# Patient Record
Sex: Female | Born: 1935 | Race: White | Hispanic: No | Marital: Married | Smoking: Never smoker
Health system: Southern US, Community
[De-identification: ages and names within clinical notes are randomized; demographics above are authoritative.]

## PROBLEM LIST (undated history)

## (undated) DIAGNOSIS — E119 Type 2 diabetes mellitus without complications: Secondary | ICD-10-CM

## (undated) DIAGNOSIS — E039 Hypothyroidism, unspecified: Secondary | ICD-10-CM

## (undated) DIAGNOSIS — E785 Hyperlipidemia, unspecified: Secondary | ICD-10-CM

## (undated) DIAGNOSIS — I447 Left bundle-branch block, unspecified: Secondary | ICD-10-CM

## (undated) DIAGNOSIS — G459 Transient cerebral ischemic attack, unspecified: Secondary | ICD-10-CM

## (undated) DIAGNOSIS — M23329 Other meniscus derangements, posterior horn of medial meniscus, unspecified knee: Secondary | ICD-10-CM

## (undated) DIAGNOSIS — I639 Cerebral infarction, unspecified: Secondary | ICD-10-CM

## (undated) DIAGNOSIS — C719 Malignant neoplasm of brain, unspecified: Secondary | ICD-10-CM

## (undated) DIAGNOSIS — C801 Malignant (primary) neoplasm, unspecified: Secondary | ICD-10-CM

## (undated) DIAGNOSIS — I059 Rheumatic mitral valve disease, unspecified: Secondary | ICD-10-CM

## (undated) DIAGNOSIS — R001 Bradycardia, unspecified: Secondary | ICD-10-CM

## (undated) DIAGNOSIS — I10 Essential (primary) hypertension: Secondary | ICD-10-CM

## (undated) DIAGNOSIS — I5032 Chronic diastolic (congestive) heart failure: Secondary | ICD-10-CM

## (undated) DIAGNOSIS — I480 Paroxysmal atrial fibrillation: Secondary | ICD-10-CM

## (undated) HISTORY — DX: Cerebral infarction, unspecified: I63.9

## (undated) HISTORY — DX: Hypothyroidism, unspecified: E03.9

## (undated) HISTORY — PX: SPLENECTOMY: SUR1306

## (undated) HISTORY — DX: Other meniscus derangements, posterior horn of medial meniscus, unspecified knee: M23.329

## (undated) HISTORY — DX: Hyperlipidemia, unspecified: E78.5

## (undated) HISTORY — DX: Essential (primary) hypertension: I10

## (undated) HISTORY — DX: Transient cerebral ischemic attack, unspecified: G45.9

## (undated) HISTORY — DX: Type 2 diabetes mellitus without complications: E11.9

## (undated) HISTORY — DX: Malignant neoplasm of brain, unspecified: C71.9

## (undated) HISTORY — DX: Rheumatic mitral valve disease, unspecified: I05.9

## (undated) HISTORY — DX: Bradycardia, unspecified: R00.1

---

## 1996-12-31 HISTORY — PX: BREAST LUMPECTOMY: SHX2

## 1997-09-02 HISTORY — PX: OTHER SURGICAL HISTORY: SHX169

## 1998-01-04 ENCOUNTER — Other Ambulatory Visit: Admission: RE | Admit: 1998-01-04 | Discharge: 1998-01-04 | Payer: Self-pay | Admitting: Obstetrics and Gynecology

## 1998-09-02 HISTORY — PX: CATARACT EXTRACTION: SUR2

## 1999-02-12 ENCOUNTER — Other Ambulatory Visit: Admission: RE | Admit: 1999-02-12 | Discharge: 1999-02-12 | Payer: Self-pay | Admitting: Obstetrics and Gynecology

## 2000-02-19 ENCOUNTER — Other Ambulatory Visit: Admission: RE | Admit: 2000-02-19 | Discharge: 2000-02-19 | Payer: Self-pay | Admitting: Obstetrics and Gynecology

## 2001-01-07 ENCOUNTER — Emergency Department (HOSPITAL_COMMUNITY): Admission: EM | Admit: 2001-01-07 | Discharge: 2001-01-07 | Payer: Self-pay | Admitting: *Deleted

## 2001-01-14 ENCOUNTER — Ambulatory Visit (HOSPITAL_COMMUNITY): Admission: RE | Admit: 2001-01-14 | Discharge: 2001-01-14 | Payer: Self-pay | Admitting: Oncology

## 2001-01-14 ENCOUNTER — Encounter (HOSPITAL_COMMUNITY): Payer: Self-pay | Admitting: Oncology

## 2001-02-19 ENCOUNTER — Other Ambulatory Visit: Admission: RE | Admit: 2001-02-19 | Discharge: 2001-02-19 | Payer: Self-pay | Admitting: Obstetrics and Gynecology

## 2001-03-17 ENCOUNTER — Encounter: Admission: RE | Admit: 2001-03-17 | Discharge: 2001-03-17 | Payer: Self-pay | Admitting: Oncology

## 2001-03-17 ENCOUNTER — Encounter (HOSPITAL_COMMUNITY): Admission: RE | Admit: 2001-03-17 | Discharge: 2001-04-16 | Payer: Self-pay | Admitting: Oncology

## 2001-05-25 ENCOUNTER — Ambulatory Visit (HOSPITAL_COMMUNITY): Admission: RE | Admit: 2001-05-25 | Discharge: 2001-05-25 | Payer: Self-pay | Admitting: General Surgery

## 2001-09-15 ENCOUNTER — Encounter (HOSPITAL_COMMUNITY): Admission: RE | Admit: 2001-09-15 | Discharge: 2001-10-15 | Payer: Self-pay | Admitting: Oncology

## 2001-09-15 ENCOUNTER — Encounter: Admission: RE | Admit: 2001-09-15 | Discharge: 2001-09-15 | Payer: Self-pay | Admitting: Oncology

## 2002-01-15 ENCOUNTER — Encounter (HOSPITAL_COMMUNITY): Payer: Self-pay | Admitting: Oncology

## 2002-01-15 ENCOUNTER — Ambulatory Visit (HOSPITAL_COMMUNITY): Admission: RE | Admit: 2002-01-15 | Discharge: 2002-01-15 | Payer: Self-pay | Admitting: Oncology

## 2002-02-15 ENCOUNTER — Encounter (HOSPITAL_COMMUNITY): Admission: RE | Admit: 2002-02-15 | Discharge: 2002-03-17 | Payer: Self-pay | Admitting: Oncology

## 2002-02-15 ENCOUNTER — Encounter: Admission: RE | Admit: 2002-02-15 | Discharge: 2002-02-15 | Payer: Self-pay | Admitting: Oncology

## 2002-05-10 ENCOUNTER — Encounter: Admission: RE | Admit: 2002-05-10 | Discharge: 2002-05-10 | Payer: Self-pay | Admitting: Oncology

## 2002-05-10 ENCOUNTER — Encounter (HOSPITAL_COMMUNITY): Admission: RE | Admit: 2002-05-10 | Discharge: 2002-06-09 | Payer: Self-pay | Admitting: Oncology

## 2003-01-24 ENCOUNTER — Ambulatory Visit (HOSPITAL_COMMUNITY): Admission: RE | Admit: 2003-01-24 | Discharge: 2003-01-24 | Payer: Self-pay | Admitting: Oncology

## 2003-01-24 ENCOUNTER — Encounter (HOSPITAL_COMMUNITY): Payer: Self-pay | Admitting: Oncology

## 2003-02-16 ENCOUNTER — Encounter (HOSPITAL_COMMUNITY): Admission: RE | Admit: 2003-02-16 | Discharge: 2003-03-18 | Payer: Self-pay | Admitting: Oncology

## 2003-02-16 ENCOUNTER — Encounter: Admission: RE | Admit: 2003-02-16 | Discharge: 2003-02-16 | Payer: Self-pay | Admitting: Oncology

## 2005-04-19 ENCOUNTER — Ambulatory Visit: Payer: Self-pay | Admitting: Family Medicine

## 2005-04-21 ENCOUNTER — Emergency Department: Payer: Self-pay | Admitting: Emergency Medicine

## 2006-03-06 ENCOUNTER — Other Ambulatory Visit: Payer: Self-pay

## 2006-03-06 ENCOUNTER — Inpatient Hospital Stay: Payer: Self-pay | Admitting: Cardiology

## 2006-03-06 ENCOUNTER — Ambulatory Visit: Payer: Self-pay | Admitting: Family Medicine

## 2006-03-16 ENCOUNTER — Emergency Department: Payer: Self-pay | Admitting: Internal Medicine

## 2006-03-16 ENCOUNTER — Other Ambulatory Visit: Payer: Self-pay

## 2006-04-30 ENCOUNTER — Ambulatory Visit: Payer: Self-pay | Admitting: Family Medicine

## 2007-05-15 ENCOUNTER — Ambulatory Visit: Payer: Self-pay | Admitting: Family Medicine

## 2008-02-27 ENCOUNTER — Inpatient Hospital Stay: Payer: Self-pay | Admitting: Internal Medicine

## 2008-02-27 ENCOUNTER — Other Ambulatory Visit: Payer: Self-pay

## 2008-05-18 ENCOUNTER — Ambulatory Visit: Payer: Self-pay | Admitting: Family Medicine

## 2009-02-07 ENCOUNTER — Ambulatory Visit: Payer: Self-pay | Admitting: Family Medicine

## 2009-02-22 ENCOUNTER — Ambulatory Visit: Payer: Self-pay | Admitting: Internal Medicine

## 2009-02-22 ENCOUNTER — Emergency Department: Payer: Self-pay | Admitting: Emergency Medicine

## 2009-08-08 ENCOUNTER — Ambulatory Visit: Payer: Self-pay | Admitting: Family Medicine

## 2010-07-25 LAB — PROTIME-INR

## 2010-08-14 ENCOUNTER — Ambulatory Visit: Payer: Self-pay | Admitting: Family Medicine

## 2010-11-01 HISTORY — PX: CATARACT EXTRACTION: SUR2

## 2010-11-21 ENCOUNTER — Ambulatory Visit: Payer: Self-pay | Admitting: Ophthalmology

## 2011-02-04 ENCOUNTER — Telehealth: Payer: Self-pay | Admitting: Cardiovascular Disease

## 2011-02-04 NOTE — Telephone Encounter (Signed)
Spoke with pt to remind her of her new pt appt with Gollan on 02/05/11.

## 2011-02-05 ENCOUNTER — Ambulatory Visit (INDEPENDENT_AMBULATORY_CARE_PROVIDER_SITE_OTHER): Payer: PRIVATE HEALTH INSURANCE | Admitting: Cardiovascular Disease

## 2011-02-05 ENCOUNTER — Encounter: Payer: Self-pay | Admitting: Cardiovascular Disease

## 2011-02-05 DIAGNOSIS — I4891 Unspecified atrial fibrillation: Secondary | ICD-10-CM | POA: Insufficient documentation

## 2011-02-05 DIAGNOSIS — I059 Rheumatic mitral valve disease, unspecified: Secondary | ICD-10-CM

## 2011-02-05 DIAGNOSIS — I10 Essential (primary) hypertension: Secondary | ICD-10-CM | POA: Insufficient documentation

## 2011-02-05 DIAGNOSIS — E119 Type 2 diabetes mellitus without complications: Secondary | ICD-10-CM

## 2011-02-05 DIAGNOSIS — E785 Hyperlipidemia, unspecified: Secondary | ICD-10-CM | POA: Insufficient documentation

## 2011-02-05 DIAGNOSIS — I340 Nonrheumatic mitral (valve) insufficiency: Secondary | ICD-10-CM

## 2011-02-05 MED ORDER — DABIGATRAN ETEXILATE MESYLATE 150 MG PO CAPS: ORAL_CAPSULE | ORAL | Status: DC

## 2011-02-05 MED ORDER — ASPIRIN EC 81 MG PO TBEC: 81.0000 mg | DELAYED_RELEASE_TABLET | Freq: Every day | ORAL | Status: AC

## 2011-02-05 NOTE — Assessment & Plan Note (Signed)
Blood pressure is well controlled on today's visit. No changes made to the medications. 

## 2011-02-05 NOTE — Assessment & Plan Note (Signed)
We have encouraged continued exercise, careful diet management in an effort to lose weight. 

## 2011-02-05 NOTE — Patient Instructions (Addendum)
You are doing well. Stop the warfarin. Start ASA 81 mg take one tablet daily. If you convert to atrial fibrillation, start pradaxa 150mg  one tab in the AM and one in the PM until your rhythm goes back into normal sinus rhythm.  If you have more episodes of atrial fibrillation, call our office to start a new medication (possibly digoxin) Please call us if you have new issues that need to be addressed before your next appt.  We will call you for a follow up Appt. In 6 months

## 2011-02-05 NOTE — Progress Notes (Signed)
   Patient ID: Rhonda Hurley, female    DOB: 1927-02-09, 77 y.o.   MRN: [id number]  HPI Comments: Rhonda Hurley is a very pleasant 77 year old woman with a history of paroxysmal atrial fibrillation, diabetes, hypertension, hypothyroidism, echocardiogram read by outside cardiologist suggesting moderate mitral valve regurgitation and diastolic dysfunction who presents to establish care.  She has accurate details of when she converts to atrial fibrillation. Her last episode was in February, lasting 6 hours. Prior to that she had an episode in February, brief episode in October. She reports her episodes are rare and did not last more than 10 hours. She has had increasing frequency and intensity of gum bleeding which he attributes to the warfarin. Atrial Fibrillation started initially in 2007 and she was started on warfarin shortly after that. She denies any significant chest pain or shortness of breath currently, no significant edema and otherwise feels well.  Remote cardiac catheterization several years ago when she first developed atrial fibrillation with no significant disease by report  Echocardiogram dated July 2008 showing normal systolic function, moderate MR, mild TR, diastolic dysfunction.  EKG today shows normal sinus rhythm with rate 57 beats per minute with no significant ST or T wave changes     Review of Systems  Constitutional: Negative.   HENT: Negative.        Bleeding gums periodically  Eyes: Negative.   Respiratory: Negative.   Cardiovascular: Negative.   Gastrointestinal: Negative.   Musculoskeletal: Negative.   Skin: Negative.   Neurological: Negative.   Hematological: Negative.   Psychiatric/Behavioral: Negative.   All other systems reviewed and are negative.    BP 128/62  Pulse 57  Ht 5\' 4"  (1.626 m)  Wt 179 lb 12.8 oz (81.557 kg)  BMI 30.86 kg/m2   Physical Exam  Nursing note and vitals reviewed. Constitutional: She is oriented to person, place, and time.  She appears well-developed and well-nourished.  HENT:  Head: Normocephalic.  Nose: Nose normal.  Mouth/Throat: Oropharynx is clear and moist.  Eyes: Conjunctivae are normal. Pupils are equal, round, and reactive to light.  Neck: Normal range of motion. Neck supple. No JVD present.  Cardiovascular: Normal rate, regular rhythm, S1 normal, S2 normal, normal heart sounds and intact distal pulses.  Exam reveals no gallop and no friction rub.   No murmur heard. Pulmonary/Chest: Effort normal and breath sounds normal. No respiratory distress. She has no wheezes. She has no rales. She exhibits no tenderness.  Abdominal: Soft. Bowel sounds are normal. She exhibits no distension. There is no tenderness.  Musculoskeletal: Normal range of motion. She exhibits no edema and no tenderness.  Lymphadenopathy:    She has no cervical adenopathy.  Neurological: She is alert and oriented to person, place, and time. Coordination normal.  Skin: Skin is warm and dry. No rash noted. No erythema.  Psychiatric: She has a normal mood and affect. Her behavior is normal. Judgment and thought content normal.         Assessment and Plan

## 2011-02-05 NOTE — Assessment & Plan Note (Signed)
She has rare episodes and is able to do fine when she converts to sinus rhythm or to atrial fibrillation. As she is having gum bleeding, has rare episodes of arrhythmia, we have suggested she could hold toward further for now. We have given her samples of pradaxa to take if she has an episode of atrial fibrillation lasting more than 12 hours or if her episodes become more frequent. I've asked her to contact our office if she has more frequent episodes. We could start digoxin if she has more frequent episodes in an effort to maintain sinus rhythm. She does not want to add this medication at this time.

## 2011-02-05 NOTE — Assessment & Plan Note (Signed)
Previous echocardiogram several years ago did suggest moderate MR. This was not appreciated on clinical exam on auscultation and she has no shortness of breath, no edema, no lung findings. No further workup at this time.

## 2011-02-05 NOTE — Assessment & Plan Note (Signed)
Cholesterol has been excellent in the past though she has cut her simvastatin in half secondary to myalgias.

## 2011-02-25 ENCOUNTER — Telehealth: Payer: Self-pay | Admitting: *Deleted

## 2011-02-25 NOTE — Telephone Encounter (Signed)
Pt called today stating that she had an episode of A Fib on Friday 02/22/11 that started ~7:30pm and lasted 10 hours. Pt took one dose of Pradaxa (was changed from coumadin to Pradaxa prn). Pt has h/o PAF, seen in office 02/05/11, and Dr. Mariah Milling advised she call when she had an episode so we can track how frequent she is in afib. Pt states she is in normal rhythm now, has no c/o at this time, just wanted to document per Dr. Windell Hummingbird recommendation. Pt's last episode was prior to visit in Feb this year. Pt will call back for any other episodes, if become frequent, digoxin was suggested per note. She did state after taking Pradaxa that her FSBS was elevated at 202, but later in the night it dropped to 88. Pt was not sure if Pradaxa could cause this, she says her glucose is never that high. She did say she had a rich dessert, and I told her I feel this is the cause, but just wanted to make sure this could not be a side effect of Pradaxa, I notified pt that per med info I did not see as S/E but will ask Dr. Mariah Milling.

## 2011-03-12 ENCOUNTER — Encounter: Payer: Self-pay | Admitting: Cardiovascular Disease

## 2011-08-14 ENCOUNTER — Encounter: Payer: Self-pay | Admitting: Cardiovascular Disease

## 2011-08-14 ENCOUNTER — Telehealth: Payer: Self-pay | Admitting: *Deleted

## 2011-08-14 ENCOUNTER — Ambulatory Visit (INDEPENDENT_AMBULATORY_CARE_PROVIDER_SITE_OTHER): Payer: PRIVATE HEALTH INSURANCE | Admitting: Cardiovascular Disease

## 2011-08-14 VITALS — BP 124/64 | HR 82 | Ht 64.0 in | Wt 176.8 lb

## 2011-08-14 DIAGNOSIS — I10 Essential (primary) hypertension: Secondary | ICD-10-CM

## 2011-08-14 DIAGNOSIS — E785 Hyperlipidemia, unspecified: Secondary | ICD-10-CM

## 2011-08-14 DIAGNOSIS — E119 Type 2 diabetes mellitus without complications: Secondary | ICD-10-CM

## 2011-08-14 DIAGNOSIS — I4891 Unspecified atrial fibrillation: Secondary | ICD-10-CM

## 2011-08-14 DIAGNOSIS — R05 Cough: Secondary | ICD-10-CM

## 2011-08-14 NOTE — Assessment & Plan Note (Signed)
Blood pressure is well controlled on today's visit. No changes made to the medications. We have suggested she hold her HCTZ if her fluid intake is poor.

## 2011-08-14 NOTE — Progress Notes (Signed)
Patient ID: Rhonda Hurley, female    DOB: [birth year], 77 y.o.   MRN: [id number]  HPI Comments: Rhonda Hurley is a very pleasant 77 year old woman with a history of paroxysmal atrial fibrillation, diabetes, hypertension, hypothyroidism, echocardiogram read by outside cardiologist suggesting moderate mitral valve regurgitation and diastolic dysfunction who presents for Routine followup.  She has been doing well until the past 10 days but she has developed an upper respiratory infection. She has had significant cough, malaise and postnasal drip. She has been taking over-the-counter cold medications. She reports that starting Sunday, 4 days ago, she felt herself go into atrial fibrillation. She felt irregular pulsations. She has been short of breath and coughing from her cold. The irregular heartbeat has persisted through the week. She was given anticoagulation/pradaxa to take on an as needed basis though she reports only taking one pill on Monday night, none on Sunday or Tuesday. None today.  Remote cardiac catheterization several years ago when she first developed atrial fibrillation with no significant disease by report  Echocardiogram dated July 2008 showing normal systolic function, moderate MR, mild TR, diastolic dysfunction.  EKG today shows Atrial fibrillation with rate 82 beats per minute    Outpatient Encounter Prescriptions as of 08/14/2011  Medication Sig Dispense Refill  . amLODipine (NORVASC) 5 MG tablet Take 5 mg by mouth daily.        Marland Kitchen aspirin EC 81 MG tablet Take 1 tablet (81 mg total) by mouth daily.  150 tablet  2  . Cholecalciferol (VITAMIN D3) 1000 UNITS CAPS Take 1,000 Units by mouth 1 dose over 24 hours.        . dabigatran (PRADAXA) 150 MG CAPS Take one tablet twice a day as needed.  60 capsule  0  . hydrochlorothiazide 25 MG tablet Take 25 mg by mouth daily.        Marland Kitchen levothyroxine (SYNTHROID, LEVOTHROID) 100 MCG tablet Take 100 mcg by mouth daily.        . metFORMIN  (GLUMETZA) 500 MG (MOD) 24 hr tablet Take 500 mg by mouth 3 (three) times daily.        . metoprolol (LOPRESSOR) 50 MG tablet Take 1 1/2 tablet twice a day.      . ramipril (ALTACE) 10 MG tablet Take 10 mg by mouth daily.        . simvastatin (ZOCOR) 20 MG tablet Take 20 mg by mouth at bedtime.        . timolol (TIMOPTIC) 0.5 % ophthalmic solution 1 drop 2 (two) times daily.        . traMADol (ULTRAM) 50 MG tablet Take 50 mg by mouth every 6 (six) hours as needed.        Marland Kitchen DISCONTD: levothyroxine (SYNTHROID, LEVOTHROID) 125 MCG tablet Take 125 mcg by mouth daily.        Marland Kitchen DISCONTD: simvastatin (ZOCOR) 40 MG tablet 20 mg.          Review of Systems  Constitutional: Negative.   HENT: Negative.   Eyes: Negative.   Respiratory: Positive for cough and shortness of breath.   Cardiovascular: Negative.   Gastrointestinal: Negative.   Musculoskeletal: Negative.   Skin: Negative.   Neurological: Negative.   Hematological: Negative.   Psychiatric/Behavioral: Negative.   All other systems reviewed and are negative.    BP 124/64  Pulse 82  Ht 5\' 4"  (1.626 m)  Wt 176 lb 12 oz (80.173 kg)  BMI 30.34 kg/m2   Physical Exam  Nursing  note and vitals reviewed. Constitutional: She is oriented to person, place, and time. She appears well-developed and well-nourished.  HENT:  Head: Normocephalic.  Nose: Nose normal.  Mouth/Throat: Oropharynx is clear and moist.  Eyes: Conjunctivae are normal. Pupils are equal, round, and reactive to light.  Neck: Normal range of motion. Neck supple. No JVD present.  Cardiovascular: Normal rate, regular rhythm, S1 normal, S2 normal, normal heart sounds and intact distal pulses.  Exam reveals no gallop and no friction rub.   No murmur heard. Pulmonary/Chest: Effort normal and breath sounds normal. No respiratory distress. She has no wheezes. She has no rales. She exhibits no tenderness.  Abdominal: Soft. Bowel sounds are normal. She exhibits no distension. There  is no tenderness.  Musculoskeletal: Normal range of motion. She exhibits no edema and no tenderness.  Lymphadenopathy:    She has no cervical adenopathy.  Neurological: She is alert and oriented to person, place, and time. Coordination normal.  Skin: Skin is warm and dry. No rash noted. No erythema.  Psychiatric: She has a normal mood and affect. Her behavior is normal. Judgment and thought content normal.         Assessment and Plan

## 2011-08-14 NOTE — Assessment & Plan Note (Signed)
I suspect she has a viral URI. We have suggested she contact Dr. Terance Hart next week if she has no improvement. This is likely the reason for her atrial fibrillation.

## 2011-08-14 NOTE — Patient Instructions (Addendum)
You are doing well. Please start pradaxa twice a day  (morning and night) Hold the HCTZ until you are eating normally and drinking   Please call us if you have new issues that need to be addressed before your next appt.  The office will contact you for a follow up Appt. In 2 to 3 weeks

## 2011-08-14 NOTE — Telephone Encounter (Signed)
Husband calling states wife is back in afib. Pt already has appt this morning. He will bring her in earlier. Mylo Red RN

## 2011-08-14 NOTE — Assessment & Plan Note (Signed)
We have encouraged continued exercise, careful diet management in an effort to lose weight. 

## 2011-08-14 NOTE — Assessment & Plan Note (Signed)
No changes to the medications were made. 

## 2011-08-14 NOTE — Assessment & Plan Note (Signed)
She has converted back to atrial fibrillation starting this past weekend. We will start anticoagulation and have her followup in several weeks time. Heart rate is reasonably well controlled on beta blockers. If she does not convert on her own, we may need cardioversion.

## 2011-08-20 ENCOUNTER — Ambulatory Visit: Payer: Self-pay | Admitting: Family Medicine

## 2011-09-06 ENCOUNTER — Encounter: Payer: Self-pay | Admitting: Cardiovascular Disease

## 2011-09-06 ENCOUNTER — Ambulatory Visit (INDEPENDENT_AMBULATORY_CARE_PROVIDER_SITE_OTHER): Payer: Medicare Other | Admitting: Cardiovascular Disease

## 2011-09-06 DIAGNOSIS — I10 Essential (primary) hypertension: Secondary | ICD-10-CM

## 2011-09-06 DIAGNOSIS — I4891 Unspecified atrial fibrillation: Secondary | ICD-10-CM

## 2011-09-06 DIAGNOSIS — E785 Hyperlipidemia, unspecified: Secondary | ICD-10-CM

## 2011-09-06 NOTE — Assessment & Plan Note (Signed)
Blood pressure is well controlled on today's visit. No changes made to the medications. 

## 2011-09-06 NOTE — Assessment & Plan Note (Signed)
We have suggested she stay on simvastatin.

## 2011-09-06 NOTE — Progress Notes (Signed)
Patient ID: Rhonda Hurley, female    DOB: 1926/11/13, 78 y.o.   MRN: [id number]  HPI Comments: Rhonda Hurley is a very pleasant 78 year old woman with a history of paroxysmal atrial fibrillation, diabetes, hypertension, hypothyroidism, echocardiogram read by outside cardiologist suggesting moderate mitral valve regurgitation and diastolic dysfunction who presents for Routine followup.  She developed an episode of atrial fibrillation on her last clinic visit in the setting of upper respiratory infection.  Last episode of atrial fibrillation prior to this was 6 months ago. She reports that after she was last seen, she converted to normal sinus rhythm relatively quickly, within the day. She has not had any further episodes since that time. She is very symptomatic when her rhythm is now and reports that she has had no arrhythmia since that time.  Remote cardiac catheterization several years ago when she first developed atrial fibrillation with no significant disease by report  Echocardiogram dated July 2008 showing normal systolic function, moderate MR, mild TR, diastolic dysfunction.     Outpatient Encounter Prescriptions as of 09/06/2011  Medication Sig Dispense Refill  . amLODipine (NORVASC) 5 MG tablet Take 5 mg by mouth daily.        Marland Kitchen aspirin EC 81 MG tablet Take 1 tablet (81 mg total) by mouth daily.  150 tablet  2  . Cholecalciferol (VITAMIN D3) 1000 UNITS CAPS Take 1,000 Units by mouth 1 dose over 24 hours.        . dabigatran (PRADAXA) 150 MG CAPS Take one tablet twice a day as needed.  60 capsule  0  . hydrochlorothiazide 25 MG tablet Take 25 mg by mouth daily.        Marland Kitchen levothyroxine (SYNTHROID, LEVOTHROID) 100 MCG tablet Take 100 mcg by mouth daily.        . metFORMIN (GLUMETZA) 500 MG (MOD) 24 hr tablet Take 500 mg by mouth 3 (three) times daily.        . metoprolol (LOPRESSOR) 50 MG tablet Take 1 1/2 tablet twice a day.      . ramipril (ALTACE) 10 MG tablet Take 10 mg by mouth  daily.        . simvastatin (ZOCOR) 20 MG tablet Take 20 mg by mouth at bedtime.        . timolol (TIMOPTIC) 0.5 % ophthalmic solution 1 drop 2 (two) times daily.        . traMADol (ULTRAM) 50 MG tablet Take 50 mg by mouth every 6 (six) hours as needed.           Review of Systems  Constitutional: Negative.   HENT: Negative.   Eyes: Negative.   Cardiovascular: Negative.   Gastrointestinal: Negative.   Musculoskeletal: Negative.   Skin: Negative.   Neurological: Negative.   Hematological: Negative.   Psychiatric/Behavioral: Negative.   All other systems reviewed and are negative.    BP 128/80  Pulse 57  Ht 5\' 5"  (1.651 m)  Wt 179 lb 8 oz (81.421 kg)  BMI 29.87 kg/m2   Physical Exam  Nursing note and vitals reviewed. Constitutional: She is oriented to person, place, and time. She appears well-developed and well-nourished.  HENT:  Head: Normocephalic.  Nose: Nose normal.  Mouth/Throat: Oropharynx is clear and moist.  Eyes: Conjunctivae are normal. Pupils are equal, round, and reactive to light.  Neck: Normal range of motion. Neck supple. No JVD present.  Cardiovascular: Normal rate, regular rhythm, S1 normal, S2 normal and intact distal pulses.  Exam reveals no  gallop and no friction rub.   Murmur heard.  Crescendo systolic murmur is present with a grade of 2/6  Pulmonary/Chest: Effort normal and breath sounds normal. No respiratory distress. She has no wheezes. She has no rales. She exhibits no tenderness.  Abdominal: Soft. Bowel sounds are normal. She exhibits no distension. There is no tenderness.  Musculoskeletal: Normal range of motion. She exhibits no edema and no tenderness.  Lymphadenopathy:    She has no cervical adenopathy.  Neurological: She is alert and oriented to person, place, and time. Coordination normal.  Skin: Skin is warm and dry. No rash noted. No erythema.  Psychiatric: She has a normal mood and affect. Her behavior is normal. Judgment and thought  content normal.         Assessment and Plan

## 2011-09-06 NOTE — Assessment & Plan Note (Signed)
Rare episodes of atrial fibrillation. Recent episode last month in the setting of upper respiratory infection. We have suggested if she has additional episodes of atrial fibrillation that she call our office, take pradaxa b.i.d. Until her rhythm has converted back to normal. She can also take extra metoprolol.

## 2011-09-06 NOTE — Patient Instructions (Signed)
You are doing well. No medication changes were made.  FOR ATRIAL FIBRILLATION: Please take pradaxa twice a day in the Am and PM, only while in atrial fibrillation (blood thinner) Take extra metoprolol for the rhythm as needed  Please call us if you have new issues that need to be addressed before your next appt.  Your physician wants you to follow-up in: 6 months.  You will receive a reminder letter in the mail two months in advance. If you don't receive a letter, please call our office to schedule the follow-up appointment.

## 2012-02-26 ENCOUNTER — Ambulatory Visit (INDEPENDENT_AMBULATORY_CARE_PROVIDER_SITE_OTHER): Payer: Medicare Other | Admitting: Cardiovascular Disease

## 2012-02-26 ENCOUNTER — Encounter: Payer: Self-pay | Admitting: Cardiovascular Disease

## 2012-02-26 VITALS — BP 142/64 | HR 53 | Ht 65.0 in | Wt 178.0 lb

## 2012-02-26 DIAGNOSIS — I340 Nonrheumatic mitral (valve) insufficiency: Secondary | ICD-10-CM

## 2012-02-26 DIAGNOSIS — E785 Hyperlipidemia, unspecified: Secondary | ICD-10-CM

## 2012-02-26 DIAGNOSIS — I10 Essential (primary) hypertension: Secondary | ICD-10-CM

## 2012-02-26 DIAGNOSIS — I4891 Unspecified atrial fibrillation: Secondary | ICD-10-CM

## 2012-02-26 DIAGNOSIS — E119 Type 2 diabetes mellitus without complications: Secondary | ICD-10-CM

## 2012-02-26 DIAGNOSIS — I059 Rheumatic mitral valve disease, unspecified: Secondary | ICD-10-CM

## 2012-02-26 MED ORDER — FLECAINIDE ACETATE 50 MG PO TABS: 50.0000 mg | ORAL_TABLET | Freq: Two times a day (BID) | ORAL | Status: DC

## 2012-02-26 NOTE — Patient Instructions (Addendum)
Please start flecainide 50 mg twice a day for atrial fibrillation Take metoprolol one pill twice a day  For atrial fibrillation, take 1/2 pill of metoprolol and one extra pill of flecainide  Please call us if you have new issues that need to be addressed before your next appt.  Your physician wants you to follow-up in: 3 months.

## 2012-02-26 NOTE — Assessment & Plan Note (Signed)
She continues to have episodes of paroxysmal atrial fibrillation. We will start flecainide 50 mg twice a day, decrease her metoprolol tartrate to 50 mg twice a day. I've asked her to call us with any side effects.

## 2012-02-26 NOTE — Assessment & Plan Note (Signed)
Blood pressure is well controlled on today's visit. No changes made to the medications. 

## 2012-02-26 NOTE — Progress Notes (Signed)
Patient ID: Rhonda Hurley, female    DOB: 19-Aug-1927, 78 y.o.   MRN: [id number]  HPI Comments: Rhonda Hurley is a very pleasant 78 year old woman with a history of paroxysmal atrial fibrillation, diabetes, hypertension, hypothyroidism, echocardiogram read by outside cardiologist suggesting moderate mitral valve regurgitation and diastolic dysfunction who presents for Routine followup.  She reports that she continues to have periods of atrial fibrillation. Last week, she had atrial fibrillation lasting more than 24 hours. 01/11/2012 she had episode lasting 6-8 hours. She would like further medical management of her atrial fibrillation. She has been taking metoprolol tartrate 75 mg twice a day. Heart rate is slow with this medication but she does not seem to have significant symptoms of fatigue or shortness of breath. She is very symptomatic when in atrial fibrillation  Remote cardiac catheterization several years ago when she first developed atrial fibrillation with no significant disease by report Echocardiogram dated July 2008 showing normal systolic function, moderate MR, mild TR, diastolic dysfunction.  EKG shows normal sinus rhythm with left bundle branch block, left anterior fascicular block     Outpatient Encounter Prescriptions as of 02/26/2012  Medication Sig Dispense Refill  . aspirin 81 MG tablet Take 81 mg by mouth daily.      . Cholecalciferol (VITAMIN D3) 1000 UNITS CAPS Take 1,000 Units by mouth 1 dose over 24 hours.        . dabigatran (PRADAXA) 150 MG CAPS Take one tablet twice a day as needed.  60 capsule  0  . hydrochlorothiazide 25 MG tablet Take 25 mg by mouth daily.        Marland Kitchen levothyroxine (SYNTHROID, LEVOTHROID) 125 MCG tablet Take 125 mcg by mouth daily.      . metFORMIN (GLUMETZA) 500 MG (MOD) 24 hr tablet Take 500 mg by mouth 3 (three) times daily.       . metoprolol (LOPRESSOR) 50 MG tablet Take 1 1/2 tablet twice a day.      . ramipril (ALTACE) 10 MG tablet Take 10  mg by mouth daily.        . simvastatin (ZOCOR) 20 MG tablet Take 20 mg by mouth at bedtime.        . timolol (TIMOPTIC) 0.5 % ophthalmic solution 1 drop 2 (two) times daily.        . traMADol (ULTRAM) 50 MG tablet Take 50 mg by mouth every 6 (six) hours as needed.         Review of Systems  Constitutional: Negative.   HENT: Negative.   Eyes: Negative.   Respiratory: Positive for shortness of breath.   Cardiovascular: Positive for palpitations.  Gastrointestinal: Negative.   Musculoskeletal: Negative.   Skin: Negative.   Neurological: Negative.   Hematological: Negative.   Psychiatric/Behavioral: Negative.   All other systems reviewed and are negative.    BP 142/64  Pulse 53  Ht 5\' 5"  (1.651 m)  Wt 178 lb (80.74 kg)  BMI 29.62 kg/m2   Physical Exam  Nursing note and vitals reviewed. Constitutional: She is oriented to person, place, and time. She appears well-developed and well-nourished.  HENT:  Head: Normocephalic.  Nose: Nose normal.  Mouth/Throat: Oropharynx is clear and moist.  Eyes: Conjunctivae are normal. Pupils are equal, round, and reactive to light.  Neck: Normal range of motion. Neck supple. No JVD present.  Cardiovascular: Normal rate, regular rhythm, S1 normal, S2 normal and intact distal pulses.  Exam reveals no gallop and no friction rub.   Murmur heard.  Crescendo systolic murmur is present with a grade of 2/6  Pulmonary/Chest: Effort normal and breath sounds normal. No respiratory distress. She has no wheezes. She has no rales. She exhibits no tenderness.  Abdominal: Soft. Bowel sounds are normal. She exhibits no distension. There is no tenderness.  Musculoskeletal: Normal range of motion. She exhibits no edema and no tenderness.  Lymphadenopathy:    She has no cervical adenopathy.  Neurological: She is alert and oriented to person, place, and time. Coordination normal.  Skin: Skin is warm and dry. No rash noted. No erythema.  Psychiatric: She has a  normal mood and affect. Her behavior is normal. Judgment and thought content normal.         Assessment and Plan

## 2012-02-26 NOTE — Assessment & Plan Note (Signed)
We have suggested she stay on her simvastatin 

## 2012-02-26 NOTE — Assessment & Plan Note (Signed)
No clinical signs of heart failure. Continue current medications. 

## 2012-02-26 NOTE — Assessment & Plan Note (Signed)
We have encouraged continued exercise, careful diet management in an effort to lose weight. 

## 2012-03-30 ENCOUNTER — Telehealth: Payer: Self-pay | Admitting: Cardiovascular Disease

## 2012-03-30 NOTE — Telephone Encounter (Signed)
FYI

## 2012-03-30 NOTE — Telephone Encounter (Signed)
Pt called stating that she had an AFIB episode yesterday and was told to call the office if this happen again.

## 2012-03-30 NOTE — Telephone Encounter (Signed)
Pt wanted to let us know she had first episode of atrial fib that she has had in 6 weeks. She says it started at 0100 this am and lasted for only  A few hours. She says it broke on its own and she "did not feels as fatigued as I usually do" while in the atrial fib. She continues to take flecainide 50 mg BID and asks if ok to take an extra flecainide if this occurs again. I explained, per Dr. Windell Hummingbird last note, it is ok for her to take either an extra 1/2 tablet metoprolol OR extra tablet flecainide.  She verb understanding and will try this next time. She wanted [place] to know she is tolerating flecainide well and atrial fib episodes are fewer.

## 2012-03-31 NOTE — Telephone Encounter (Signed)
Great news.

## 2012-06-17 ENCOUNTER — Telehealth: Payer: Self-pay | Admitting: Cardiovascular Disease

## 2012-06-17 NOTE — Telephone Encounter (Signed)
Pt says she just wanted [place] to know she had an episode of atrial fib at 0115 this am. She took an extra 1/2 of metoprolol as well as an extra 1/2 of flecainide. She says rate is much improved but she can still feel it is slightly irregular. She is asymptomatic, denies sob, dizziness, or edema Has appt with Dr. Mariah Milling 07/08/12 at 1030 I reassured her she did the right thing; ok to take extra 1/2 of metoprolol and flecainide as she did I told her i would let Dr. Mariah Milling know and if he feels something different should be done, we would call her back. Understanding verb.

## 2012-06-17 NOTE — Telephone Encounter (Signed)
If she feels it is irregular in the morning, would take extra flecainide again for a total of 100 mg

## 2012-06-17 NOTE — Telephone Encounter (Signed)
Pt states Dr. Mariah Milling informed her to call whenever she has another A Fib episode.  It started last night after 1am.  Pt just calling to inform.

## 2012-06-18 NOTE — Telephone Encounter (Signed)
Pt informed Understanding verb 

## 2012-07-08 ENCOUNTER — Encounter: Payer: Self-pay | Admitting: Cardiovascular Disease

## 2012-07-08 ENCOUNTER — Ambulatory Visit (INDEPENDENT_AMBULATORY_CARE_PROVIDER_SITE_OTHER): Payer: Medicare Other | Admitting: Cardiovascular Disease

## 2012-07-08 VITALS — BP 140/60 | HR 54 | Ht 65.0 in | Wt 180.0 lb

## 2012-07-08 DIAGNOSIS — I10 Essential (primary) hypertension: Secondary | ICD-10-CM

## 2012-07-08 DIAGNOSIS — E785 Hyperlipidemia, unspecified: Secondary | ICD-10-CM

## 2012-07-08 DIAGNOSIS — E119 Type 2 diabetes mellitus without complications: Secondary | ICD-10-CM

## 2012-07-08 DIAGNOSIS — I4891 Unspecified atrial fibrillation: Secondary | ICD-10-CM

## 2012-07-08 NOTE — Patient Instructions (Addendum)
You are doing well. No medication changes were made.  Please call us if you have new issues that need to be addressed before your next appt.  Your physician wants you to follow-up in: 6 months.  You will receive a reminder letter in the mail two months in advance. If you don't receive a letter, please call our office to schedule the follow-up appointment.   

## 2012-07-08 NOTE — Assessment & Plan Note (Signed)
We have encouraged continued exercise, careful diet management in an effort to lose weight. 

## 2012-07-08 NOTE — Assessment & Plan Note (Signed)
We have suggested that she stay on her simvastatin. No recent lipid panel available.

## 2012-07-08 NOTE — Progress Notes (Signed)
Patient ID: Rhonda Hurley, female    DOB: [birth year], 78 y.o.   MRN: [id number]  HPI Comments: Rhonda Hurley is a very pleasant 78 year old woman with a history of paroxysmal atrial fibrillation, diabetes, hypertension, hypothyroidism, echocardiogram read by outside cardiologist suggesting moderate mitral valve regurgitation and diastolic dysfunction who presents for Routine followup.  On her last clinic visit, we started flecainide 50 mg twice a day for continued episodes of paroxysmal atrial fibrillation. She reports that since this medication was started, she has only had one episode of atrial fibrillation, last in August 2013. She feels much better and is very happy with her current management and medications. Otherwise she feels well with no complaints.  Remote cardiac catheterization several years ago when she first developed atrial fibrillation with no significant disease by report Echocardiogram dated July 2008 showing normal systolic function, moderate MR, mild TR, diastolic dysfunction.  EKG shows normal sinus rhythm with rate of 54 beats per minute,  left bundle branch block, left anterior fascicular block     Outpatient Encounter Prescriptions as of 07/08/2012  Medication Sig Dispense Refill  . aspirin 81 MG tablet Take 81 mg by mouth daily.      . Cholecalciferol (VITAMIN D3) 1000 UNITS CAPS Take 1,000 Units by mouth 1 dose over 24 hours.        . flecainide (TAMBOCOR) 50 MG tablet Take 1 tablet (50 mg total) by mouth 2 (two) times daily.  60 tablet  6  . hydrochlorothiazide 25 MG tablet Take 25 mg by mouth daily.        Marland Kitchen levothyroxine (SYNTHROID, LEVOTHROID) 125 MCG tablet Take 125 mcg by mouth daily.      . metFORMIN (GLUMETZA) 500 MG (MOD) 24 hr tablet Take 500 mg by mouth 3 (three) times daily.       . metoprolol (LOPRESSOR) 50 MG tablet Take by mouth 2 (two) times daily.       . ramipril (ALTACE) 10 MG tablet Take 10 mg by mouth daily.        . simvastatin (ZOCOR) 20 MG  tablet Take 20 mg by mouth at bedtime.        . timolol (TIMOPTIC) 0.5 % ophthalmic solution 1 drop 2 (two) times daily.        . traMADol (ULTRAM) 50 MG tablet Take 50 mg by mouth every 6 (six) hours as needed.          Review of Systems  Constitutional: Negative.   HENT: Negative.   Eyes: Negative.   Gastrointestinal: Negative.   Musculoskeletal: Negative.   Skin: Negative.   Neurological: Negative.   Hematological: Negative.   Psychiatric/Behavioral: Negative.   All other systems reviewed and are negative.    BP 140/60  Pulse 54  Ht 5\' 5"  (1.651 m)  Wt 180 lb (81.647 kg)  BMI 29.95 kg/m2  Physical Exam  Nursing note and vitals reviewed. Constitutional: She is oriented to person, place, and time. She appears well-developed and well-nourished.  HENT:  Head: Normocephalic.  Nose: Nose normal.  Mouth/Throat: Oropharynx is clear and moist.  Eyes: Conjunctivae normal are normal. Pupils are equal, round, and reactive to light.  Neck: Normal range of motion. Neck supple. No JVD present.  Cardiovascular: Normal rate, regular rhythm, S1 normal, S2 normal and intact distal pulses.  Exam reveals no gallop and no friction rub.   Murmur heard.  Crescendo systolic murmur is present with a grade of 2/6  Pulmonary/Chest: Effort normal and breath sounds  normal. No respiratory distress. She has no wheezes. She has no rales. She exhibits no tenderness.  Abdominal: Soft. Bowel sounds are normal. She exhibits no distension. There is no tenderness.  Musculoskeletal: Normal range of motion. She exhibits no edema and no tenderness.  Lymphadenopathy:    She has no cervical adenopathy.  Neurological: She is alert and oriented to person, place, and time. Coordination normal.  Skin: Skin is warm and dry. No rash noted. No erythema.  Psychiatric: She has a normal mood and affect. Her behavior is normal. Judgment and thought content normal.         Assessment and Plan

## 2012-07-08 NOTE — Assessment & Plan Note (Signed)
Blood pressure is well controlled on today's visit. No changes made to the medications. 

## 2012-07-08 NOTE — Assessment & Plan Note (Signed)
Maintaining normal sinus rhythm. No changes made to her medications. 

## 2012-08-27 ENCOUNTER — Ambulatory Visit: Payer: Self-pay | Admitting: Family Medicine

## 2012-09-02 DIAGNOSIS — G459 Transient cerebral ischemic attack, unspecified: Secondary | ICD-10-CM

## 2012-09-02 HISTORY — DX: Transient cerebral ischemic attack, unspecified: G45.9

## 2012-09-13 ENCOUNTER — Other Ambulatory Visit: Payer: Self-pay | Admitting: Cardiovascular Disease

## 2012-09-14 ENCOUNTER — Other Ambulatory Visit: Payer: Self-pay | Admitting: *Deleted

## 2012-09-14 MED ORDER — FLECAINIDE ACETATE 50 MG PO TABS: 50.0000 mg | ORAL_TABLET | Freq: Two times a day (BID) | ORAL | Status: DC

## 2012-09-14 NOTE — Telephone Encounter (Signed)
Refilled Flecainide. 

## 2012-10-17 ENCOUNTER — Other Ambulatory Visit: Payer: Self-pay

## 2012-11-14 ENCOUNTER — Inpatient Hospital Stay: Payer: Self-pay | Admitting: Specialist

## 2012-11-14 DIAGNOSIS — I4891 Unspecified atrial fibrillation: Secondary | ICD-10-CM

## 2012-11-14 DIAGNOSIS — G459 Transient cerebral ischemic attack, unspecified: Secondary | ICD-10-CM

## 2012-11-14 LAB — URINALYSIS, COMPLETE
Glucose,UR: NEGATIVE mg/dL (ref 0–75)
Ketone: NEGATIVE
Nitrite: NEGATIVE
Ph: 7 (ref 4.5–8.0)
Protein: NEGATIVE
RBC,UR: <1 /HPF (ref 0–5)
Specific Gravity: 1.003 (ref 1.003–1.030)

## 2012-11-14 LAB — COMPREHENSIVE METABOLIC PANEL
Albumin: 3.6 g/dL (ref 3.4–5.0)
Alkaline Phosphatase: 115 U/L (ref 50–136)
Calcium, Total: 9.1 mg/dL (ref 8.5–10.1)
Co2: 24 mmol/L (ref 21–32)
Glucose: 186 mg/dL — ABNORMAL HIGH (ref 65–99)
Potassium: 3.9 mmol/L (ref 3.5–5.1)
SGPT (ALT): 23 U/L (ref 12–78)
Sodium: 138 mmol/L (ref 136–145)
Total Protein: 7.4 g/dL (ref 6.4–8.2)

## 2012-11-14 LAB — CK TOTAL AND CKMB (NOT AT ARMC)
CK, Total: 49 U/L (ref 21–215)
CK-MB: 0.6 ng/mL (ref 0.5–3.6)

## 2012-11-14 LAB — CBC
HGB: 13.5 g/dL (ref 12.0–16.0)
MCH: 29.5 pg (ref 26.0–34.0)
MCHC: 33 g/dL (ref 32.0–36.0)
RBC: 4.56 10*6/uL (ref 3.80–5.20)
RDW: 14.5 % (ref 11.5–14.5)

## 2012-11-14 LAB — TROPONIN I: Troponin-I: <0.02 ng/mL

## 2012-11-15 DIAGNOSIS — I359 Nonrheumatic aortic valve disorder, unspecified: Secondary | ICD-10-CM

## 2012-11-18 ENCOUNTER — Encounter: Payer: Self-pay | Admitting: Cardiovascular Disease

## 2012-11-18 ENCOUNTER — Ambulatory Visit (INDEPENDENT_AMBULATORY_CARE_PROVIDER_SITE_OTHER): Payer: Medicare Other | Admitting: Cardiovascular Disease

## 2012-11-18 VITALS — BP 132/74 | HR 52 | Ht 65.5 in | Wt 173.0 lb

## 2012-11-18 DIAGNOSIS — I10 Essential (primary) hypertension: Secondary | ICD-10-CM

## 2012-11-18 DIAGNOSIS — I4891 Unspecified atrial fibrillation: Secondary | ICD-10-CM

## 2012-11-18 DIAGNOSIS — E785 Hyperlipidemia, unspecified: Secondary | ICD-10-CM

## 2012-11-18 DIAGNOSIS — E119 Type 2 diabetes mellitus without complications: Secondary | ICD-10-CM

## 2012-11-18 NOTE — Progress Notes (Signed)
Patient ID: Rhonda Hurley, female    DOB: 01-Nov-1926, 79 y.o.   MRN: [id number]  HPI Comments: Rhonda Hurley is a very pleasant 79 year old woman with a history of paroxysmal atrial fibrillation, diabetes, hypertension, hypothyroidism, echocardiogram read by outside cardiologist suggesting moderate mitral valve regurgitation and diastolic dysfunction who presents for Routine followup.   flecainide 50 mg twice a day started on a previous office visit with good results. Less paroxysmal atrial fibrillation.  Recent TIA episode with hospital admission 11/14/2012. She recovered well. Head scans did not show large stroke. She was in atrial fibrillation at that time. She had refused anticoagulation in the past as she had bleeding of her gums.  In followup today, she reports that she has recovered, feels well. She has not appreciated any episodes of palpitations. Flecainide was stopped in the hospital as they felt she was in chronic atrial fibrillation when in fact review of the notes shows she is paroxysmal atrial fibrillation and typically maintains normal sinus rhythm on flecainide. She does have mild fatigue  Echocardiogram done in the hospital showed ejection fraction 50-55%, possible hypokinesis of anterior and anteroseptal wall, mild to moderate LVH, normal right ventricular systolic pressures  Remote cardiac catheterization several years ago when she first developed atrial fibrillation with no significant disease by report Echocardiogram dated July 2008 showing normal systolic function, moderate MR, mild TR, diastolic dysfunction.  EKG shows normal sinus rhythm with rate of 52 beats per minute,  left bundle branch block      Outpatient Encounter Prescriptions as of 11/18/2012  Medication Sig Dispense Refill  . Cholecalciferol (VITAMIN D3) 1000 UNITS CAPS Take 1,000 Units by mouth 1 dose over 24 hours.        Marland Kitchen ELIQUIS 5 MG TABS tablet Take 5 mg by mouth 2 (two) times daily.       .  flecainide (TAMBOCOR) 50 MG tablet Take 1 tablet (50 mg total) by mouth 2 (two) times daily.  60 tablet  6  . hydrochlorothiazide 25 MG tablet Take 25 mg by mouth daily.        Marland Kitchen levothyroxine (SYNTHROID, LEVOTHROID) 125 MCG tablet Take 125 mcg by mouth daily.      . metFORMIN (GLUMETZA) 500 MG (MOD) 24 hr tablet Take 500 mg by mouth 3 (three) times daily.       . metoprolol (LOPRESSOR) 50 MG tablet Take by mouth 2 (two) times daily.       . ramipril (ALTACE) 10 MG tablet Take 10 mg by mouth daily.        . simvastatin (ZOCOR) 20 MG tablet Take 20 mg by mouth at bedtime.        . timolol (TIMOPTIC) 0.5 % ophthalmic solution 1 drop 2 (two) times daily.        . traMADol (ULTRAM) 50 MG tablet Take 50 mg by mouth every 6 (six) hours as needed.        . [DISCONTINUED] aspirin 81 MG tablet Take 81 mg by mouth daily.       No facility-administered encounter medications on file as of 11/18/2012.     Review of Systems  Constitutional: Negative.   HENT: Negative.   Eyes: Negative.   Respiratory: Negative.   Cardiovascular: Negative.   Gastrointestinal: Negative.   Musculoskeletal: Negative.   Skin: Negative.   Neurological: Negative.   Psychiatric/Behavioral: Negative.   All other systems reviewed and are negative.    BP 132/74  Pulse 52  Ht 5' 5.5" (1.664  m)  Wt 173 lb (78.472 kg)  BMI 28.34 kg/m2  Physical Exam  Nursing note and vitals reviewed. Constitutional: She is oriented to person, place, and time. She appears well-developed and well-nourished.  HENT:  Head: Normocephalic.  Nose: Nose normal.  Mouth/Throat: Oropharynx is clear and moist.  Eyes: Conjunctivae are normal. Pupils are equal, round, and reactive to light.  Neck: Normal range of motion. Neck supple. No JVD present.  Cardiovascular: Normal rate, regular rhythm, S1 normal, S2 normal and intact distal pulses.  Exam reveals no gallop and no friction rub.   Murmur heard.  Crescendo systolic murmur is present with a  grade of 2/6  Pulmonary/Chest: Effort normal and breath sounds normal. No respiratory distress. She has no wheezes. She has no rales. She exhibits no tenderness.  Abdominal: Soft. Bowel sounds are normal. She exhibits no distension. There is no tenderness.  Musculoskeletal: Normal range of motion. She exhibits no edema and no tenderness.  Lymphadenopathy:    She has no cervical adenopathy.  Neurological: She is alert and oriented to person, place, and time. Coordination normal.  Skin: Skin is warm and dry. No rash noted. No erythema.  Psychiatric: She has a normal mood and affect. Her behavior is normal. Judgment and thought content normal.    Assessment and Plan

## 2012-11-18 NOTE — Assessment & Plan Note (Signed)
We have encouraged her to stay on her statin 

## 2012-11-18 NOTE — Assessment & Plan Note (Signed)
We have encouraged continued exercise, careful diet management in an effort to lose weight. 

## 2012-11-18 NOTE — Assessment & Plan Note (Signed)
Converted back to normal sinus rhythm. We have suggested she stay on her flecainide 50 mg twice a day. Heart rate is slow and she could decrease metoprolol tartrate down to 25 mg twice a day. She is now on Eliquis which she can stay on the she would prefer to change to pradaxa for cost reasons

## 2012-11-18 NOTE — Assessment & Plan Note (Signed)
We'll decrease metoprolol secondary to bradycardia

## 2012-11-18 NOTE — Patient Instructions (Addendum)
Please cut the metoprolol in 1/2 twice a day (your heart rate is low in the low 50s on 50 mg pill)  Once eliquis script is gone, you could try to change to pradaxa twice a day Call for a script of pradaxa if it is cheaper  Please call us if you have new issues that need to be addressed before your next appt.  Your physician wants you to follow-up in: 6 months.  You will receive a reminder letter in the mail two months in advance. If you don't receive a letter, please call our office to schedule the follow-up appointment.

## 2013-01-04 ENCOUNTER — Other Ambulatory Visit: Payer: Self-pay

## 2013-01-04 MED ORDER — APIXABAN 5 MG PO TABS: 5.0000 mg | ORAL_TABLET | Freq: Two times a day (BID) | ORAL | Status: DC

## 2013-01-04 NOTE — Telephone Encounter (Signed)
error 

## 2013-04-05 ENCOUNTER — Other Ambulatory Visit: Payer: Self-pay | Admitting: Cardiovascular Disease

## 2013-04-05 NOTE — Telephone Encounter (Signed)
Refilled Flecainide sent to [facility]. 

## 2013-04-07 ENCOUNTER — Other Ambulatory Visit: Payer: Self-pay

## 2013-06-10 ENCOUNTER — Ambulatory Visit (INDEPENDENT_AMBULATORY_CARE_PROVIDER_SITE_OTHER): Payer: Medicare Other | Admitting: Cardiovascular Disease

## 2013-06-10 ENCOUNTER — Encounter: Payer: Self-pay | Admitting: Cardiovascular Disease

## 2013-06-10 VITALS — BP 146/68 | HR 55 | Ht 65.0 in | Wt 178.0 lb

## 2013-06-10 DIAGNOSIS — I340 Nonrheumatic mitral (valve) insufficiency: Secondary | ICD-10-CM

## 2013-06-10 DIAGNOSIS — E785 Hyperlipidemia, unspecified: Secondary | ICD-10-CM

## 2013-06-10 DIAGNOSIS — I4891 Unspecified atrial fibrillation: Secondary | ICD-10-CM

## 2013-06-10 DIAGNOSIS — I059 Rheumatic mitral valve disease, unspecified: Secondary | ICD-10-CM

## 2013-06-10 DIAGNOSIS — I10 Essential (primary) hypertension: Secondary | ICD-10-CM

## 2013-06-10 DIAGNOSIS — E119 Type 2 diabetes mellitus without complications: Secondary | ICD-10-CM

## 2013-06-10 NOTE — Assessment & Plan Note (Signed)
We have encouraged continued exercise, careful diet management in an effort to lose weight. 

## 2013-06-10 NOTE — Assessment & Plan Note (Signed)
No symptoms concerning for worsening mitral valve insufficiency. Suggest this may have been an over read. No significant murmur on exam

## 2013-06-10 NOTE — Patient Instructions (Signed)
You are doing well. No medication changes were made.  Please call us if you have new issues that need to be addressed before your next appt.  Your physician wants you to follow-up in: 6 months.  You will receive a reminder letter in the mail two months in advance. If you don't receive a letter, please call our office to schedule the follow-up appointment.   

## 2013-06-10 NOTE — Assessment & Plan Note (Signed)
We have encouraged her to stay on her simvastatin 

## 2013-06-10 NOTE — Assessment & Plan Note (Signed)
No recent episodes of atrial fibrillation. No medication changes made

## 2013-06-10 NOTE — Progress Notes (Signed)
Patient ID: Rhonda Hurley, female    DOB: Sep 23, 1926, 79 y.o.   MRN: [id number]  HPI Comments: Rhonda Hurley is a very pleasant 79 year old woman with a history of paroxysmal atrial fibrillation, diabetes, hypertension, hypothyroidism, echocardiogram read by outside cardiologist suggesting moderate mitral valve regurgitation and diastolic dysfunction who presents for Routine followup.  She has been taking flecainide 50 mg twice a day started on a previous office visit with good results. Less paroxysmal atrial fibrillation.  Recent TIA episode with hospital admission 11/14/2012. She recovered well. Head scans did not show large stroke. She was in atrial fibrillation at that time. She had refused anticoagulation in the past as she had bleeding of her gums.   In followup today, she reports that she has recovered, feels well. She has not appreciated any episodes of palpitations. She is taking metoprolol 25 mg twice a day, tolerating eliquis/  Previous  Echocardiogram done in the hospital showed ejection fraction 50-55%, possible hypokinesis of anterior and anteroseptal wall, mild to moderate LVH, normal right ventricular systolic pressures  Remote cardiac catheterization several years ago when she first developed atrial fibrillation with no significant disease by report Echocardiogram dated July 2008 showing normal systolic function, moderate MR, mild TR, diastolic dysfunction.  Lab work October 2013 showing hemoglobin A1c 7.3  EKG shows normal sinus rhythm with rate of 55 beats per minute,  left bundle branch block      Outpatient Encounter Prescriptions as of 06/10/2013  Medication Sig Dispense Refill  . apixaban (ELIQUIS) 5 MG TABS tablet Take 1 tablet (5 mg total) by mouth 2 (two) times daily.  60 tablet  6  . Cholecalciferol (VITAMIN D3) 1000 UNITS CAPS Take 1,000 Units by mouth 1 dose over 24 hours.        . flecainide (TAMBOCOR) 50 MG tablet TAKE 1 TABLET (50 MG TOTAL) BY MOUTH 2 (TWO)  TIMES DAILY.  60 tablet  6  . hydrochlorothiazide 25 MG tablet Take 25 mg by mouth daily.        Marland Kitchen levothyroxine (SYNTHROID, LEVOTHROID) 125 MCG tablet Take 125 mcg by mouth daily.      . metFORMIN (GLUMETZA) 500 MG (MOD) 24 hr tablet Take 500 mg by mouth 3 (three) times daily.       . metoprolol (LOPRESSOR) 50 MG tablet Take by mouth 2 (two) times daily.       . ramipril (ALTACE) 10 MG tablet Take 10 mg by mouth daily.        . simvastatin (ZOCOR) 20 MG tablet Take 20 mg by mouth at bedtime.        . timolol (TIMOPTIC) 0.5 % ophthalmic solution 1 drop 2 (two) times daily.        . [DISCONTINUED] traMADol (ULTRAM) 50 MG tablet Take 50 mg by mouth every 6 (six) hours as needed.         No facility-administered encounter medications on file as of 06/10/2013.     Review of Systems  Constitutional: Negative.   HENT: Negative.   Eyes: Negative.   Respiratory: Negative.   Cardiovascular: Negative.   Gastrointestinal: Negative.   Endocrine: Negative.   Musculoskeletal: Negative.   Skin: Negative.   Allergic/Immunologic: Negative.   Neurological: Negative.   Hematological: Negative.   Psychiatric/Behavioral: Negative.   All other systems reviewed and are negative.    BP 146/68  Pulse 55  Ht 5\' 5"  (1.651 m)  Wt 178 lb (80.74 kg)  BMI 29.62 kg/m2  Physical Exam  Nursing note and vitals reviewed. Constitutional: She is oriented to person, place, and time. She appears well-developed and well-nourished.  HENT:  Head: Normocephalic.  Nose: Nose normal.  Mouth/Throat: Oropharynx is clear and moist.  Eyes: Conjunctivae are normal. Pupils are equal, round, and reactive to light.  Neck: Normal range of motion. Neck supple. No JVD present.  Cardiovascular: Normal rate, regular rhythm, S1 normal, S2 normal and intact distal pulses.  Exam reveals no gallop and no friction rub.   Murmur heard.  Crescendo systolic murmur is present with a grade of 2/6  Pulmonary/Chest: Effort normal and  breath sounds normal. No respiratory distress. She has no wheezes. She has no rales. She exhibits no tenderness.  Abdominal: Soft. Bowel sounds are normal. She exhibits no distension. There is no tenderness.  Musculoskeletal: Normal range of motion. She exhibits no edema and no tenderness.  Lymphadenopathy:    She has no cervical adenopathy.  Neurological: She is alert and oriented to person, place, and time. Coordination normal.  Skin: Skin is warm and dry. No rash noted. No erythema.  Psychiatric: She has a normal mood and affect. Her behavior is normal. Judgment and thought content normal.    Assessment and Plan

## 2013-06-10 NOTE — Assessment & Plan Note (Signed)
Blood pressure is well controlled on today's visit. No changes made to the medications. 

## 2013-07-08 ENCOUNTER — Other Ambulatory Visit: Payer: Self-pay

## 2013-08-30 ENCOUNTER — Ambulatory Visit: Payer: Self-pay | Admitting: Family Medicine

## 2013-11-15 ENCOUNTER — Other Ambulatory Visit: Payer: Self-pay | Admitting: Cardiovascular Disease

## 2013-11-23 ENCOUNTER — Other Ambulatory Visit: Payer: Self-pay | Admitting: Cardiovascular Disease

## 2013-12-21 ENCOUNTER — Encounter: Payer: Self-pay | Admitting: Cardiovascular Disease

## 2013-12-21 ENCOUNTER — Ambulatory Visit (INDEPENDENT_AMBULATORY_CARE_PROVIDER_SITE_OTHER): Payer: Medicare HMO | Admitting: Cardiovascular Disease

## 2013-12-21 VITALS — BP 150/62 | HR 58 | Ht 65.0 in | Wt 179.0 lb

## 2013-12-21 DIAGNOSIS — I059 Rheumatic mitral valve disease, unspecified: Secondary | ICD-10-CM

## 2013-12-21 DIAGNOSIS — E119 Type 2 diabetes mellitus without complications: Secondary | ICD-10-CM

## 2013-12-21 DIAGNOSIS — E785 Hyperlipidemia, unspecified: Secondary | ICD-10-CM

## 2013-12-21 DIAGNOSIS — I10 Essential (primary) hypertension: Secondary | ICD-10-CM

## 2013-12-21 DIAGNOSIS — I4891 Unspecified atrial fibrillation: Secondary | ICD-10-CM

## 2013-12-21 DIAGNOSIS — I340 Nonrheumatic mitral (valve) insufficiency: Secondary | ICD-10-CM

## 2013-12-21 NOTE — Assessment & Plan Note (Signed)
Cholesterol is at goal on the current lipid regimen. No changes to the medications were made. Numbers should improve with better diabetes control

## 2013-12-21 NOTE — Assessment & Plan Note (Signed)
We have encouraged continued exercise, careful diet management in an effort to lose weight. 

## 2013-12-21 NOTE — Assessment & Plan Note (Signed)
Murmur appreciated on exam. No clinical signs of heart failure. No worsening shortness of breath. We'll continue HCTZ and monitoring

## 2013-12-21 NOTE — Assessment & Plan Note (Addendum)
Rare episodes of atrial fibrillation. We talked about her episode last week. Continue current medication management. Extra flecainide for breakthrough arrhythmia

## 2013-12-21 NOTE — Assessment & Plan Note (Signed)
Blood pressure mildly elevated today. She reports is well controlled at home. No medication changes made today. Recommended she closely monitor her blood pressure at home

## 2013-12-21 NOTE — Progress Notes (Signed)
Patient ID: Rhonda Hurley, female    DOB: 02-01-1927, 80 y.o.   MRN: [id number]  HPI Comments: Rhonda Hurley is a very pleasant 80 year old woman with a history of paroxysmal atrial fibrillation, prior TIA in March 2014,  diabetes, hypertension, hypothyroidism, echocardiogram read by outside cardiologist suggesting moderate mitral valve regurgitation and diastolic dysfunction who presents for Routine followup.  She has been taking flecainide 50 mg twice a day  Less paroxysmal atrial fibrillation.  TIA episode with hospital admission 11/14/2012. She recovered well. Head scans did not show large stroke. She was in atrial fibrillation at that time. She had refused anticoagulation in the past as she had bleeding of her gums. Now she takes anticoagulation. No recent bleeding  She does report having an episode of atrial fibrillation 10 days ago. This lasted all afternoon. She was symptomatic with palpitations. She took an extra flecainide, Relaxed and symptoms resolved on their own. In general she has very rare episodes. Hemoglobin A1c 7.4. Total cholesterol 159, LDL 83 She is tring to work on her diet and weight loss  Previous  Echocardiogram done in the hospital showed ejection fraction 50-55%, possible hypokinesis of anterior and anteroseptal wall, mild to moderate LVH, normal right ventricular systolic pressures  Remote cardiac catheterization several years ago when she first developed atrial fibrillation with no significant disease by report Echocardiogram dated July 2008 showing normal systolic function, moderate MR, mild TR, diastolic dysfunction.  Lab work October 2013 showing hemoglobin A1c 7.3  EKG shows normal sinus rhythm with rate of 58 beats per minute,  left bundle branch block      Outpatient Encounter Prescriptions as of 12/21/2013  Medication Sig  . Cholecalciferol (VITAMIN D3) 1000 UNITS CAPS Take 1,000 Units by mouth 1 dose over 24 hours.    Marland Kitchen ELIQUIS 5 MG TABS tablet TAKE 1  TABLET (5 MG TOTAL) BY MOUTH 2 (TWO) TIMES DAILY.  . flecainide (TAMBOCOR) 50 MG tablet TAKE 1 TABLET BY MOUTH 2 TIMES DAILY.  . hydrochlorothiazide 25 MG tablet Take 25 mg by mouth daily.    Marland Kitchen levothyroxine (SYNTHROID, LEVOTHROID) 125 MCG tablet Take 125 mcg by mouth daily.  . metFORMIN (GLUMETZA) 500 MG (MOD) 24 hr tablet Take 500 mg by mouth 3 (three) times daily.   . metoprolol (LOPRESSOR) 50 MG tablet Take by mouth 2 (two) times daily.   . ramipril (ALTACE) 10 MG tablet Take 10 mg by mouth daily.    . simvastatin (ZOCOR) 20 MG tablet Take 20 mg by mouth at bedtime.    . timolol (TIMOPTIC) 0.5 % ophthalmic solution 1 drop 2 (two) times daily.       Review of Systems  Constitutional: Negative.   HENT: Negative.   Eyes: Negative.   Respiratory: Negative.   Cardiovascular: Positive for palpitations.  Gastrointestinal: Negative.   Endocrine: Negative.   Musculoskeletal: Negative.   Skin: Negative.   Allergic/Immunologic: Negative.   Neurological: Negative.   Hematological: Negative.   Psychiatric/Behavioral: Negative.   All other systems reviewed and are negative.   BP 150/62  Pulse 58  Ht 5\' 5"  (1.651 m)  Wt 179 lb (81.194 kg)  BMI 29.79 kg/m2  Physical Exam  Nursing note and vitals reviewed. Constitutional: She is oriented to person, place, and time. She appears well-developed and well-nourished.  HENT:  Head: Normocephalic.  Nose: Nose normal.  Mouth/Throat: Oropharynx is clear and moist.  Eyes: Conjunctivae are normal. Pupils are equal, round, and reactive to light.  Neck: Normal range of  motion. Neck supple. No JVD present.  Cardiovascular: Normal rate, regular rhythm, S1 normal, S2 normal and intact distal pulses.  Exam reveals no gallop and no friction rub.   Murmur heard.  Crescendo systolic murmur is present with a grade of 2/6  Pulmonary/Chest: Effort normal and breath sounds normal. No respiratory distress. She has no wheezes. She has no rales. She exhibits  no tenderness.  Abdominal: Soft. Bowel sounds are normal. She exhibits no distension. There is no tenderness.  Musculoskeletal: Normal range of motion. She exhibits no edema and no tenderness.  Lymphadenopathy:    She has no cervical adenopathy.  Neurological: She is alert and oriented to person, place, and time. Coordination normal.  Skin: Skin is warm and dry. No rash noted. No erythema.  Psychiatric: She has a normal mood and affect. Her behavior is normal. Judgment and thought content normal.    Assessment and Plan       a

## 2013-12-21 NOTE — Patient Instructions (Signed)
You are doing well. No medication changes were made.  Monitor your pulse. If it gets slow, call the office (low 50s) Call if you start to have more frequent atrial fibrillation  Please call us if you have new issues that need to be addressed before your next appt.  Your physician wants you to follow-up in: 6 months.  You will receive a reminder letter in the mail two months in advance. If you don't receive a letter, please call our office to schedule the follow-up appointment.

## 2014-02-16 ENCOUNTER — Telehealth: Payer: Self-pay

## 2014-02-16 NOTE — Telephone Encounter (Signed)
Spoke w/ pt.  Advised her that I am leaving samples of Eliquis at front desk for her to pick up at her convenience.

## 2014-02-16 NOTE — Telephone Encounter (Signed)
Pt would like Eliquis samples.  

## 2014-04-08 DIAGNOSIS — I10 Essential (primary) hypertension: Secondary | ICD-10-CM

## 2014-04-08 DIAGNOSIS — I480 Paroxysmal atrial fibrillation: Secondary | ICD-10-CM | POA: Insufficient documentation

## 2014-04-08 DIAGNOSIS — E119 Type 2 diabetes mellitus without complications: Secondary | ICD-10-CM | POA: Insufficient documentation

## 2014-04-08 DIAGNOSIS — Z853 Personal history of malignant neoplasm of breast: Secondary | ICD-10-CM | POA: Insufficient documentation

## 2014-04-08 HISTORY — DX: Essential (primary) hypertension: I10

## 2014-06-13 ENCOUNTER — Other Ambulatory Visit: Payer: Self-pay

## 2014-06-13 MED ORDER — FLECAINIDE ACETATE 50 MG PO TABS: ORAL_TABLET | ORAL | Status: DC

## 2014-06-13 NOTE — Telephone Encounter (Signed)
Refill sent for flecainide

## 2014-06-20 ENCOUNTER — Encounter: Payer: Self-pay | Admitting: Cardiovascular Disease

## 2014-06-20 ENCOUNTER — Ambulatory Visit (INDEPENDENT_AMBULATORY_CARE_PROVIDER_SITE_OTHER): Payer: Medicare HMO | Admitting: Cardiovascular Disease

## 2014-06-20 VITALS — BP 140/78 | HR 64 | Ht 64.5 in | Wt 176.0 lb

## 2014-06-20 DIAGNOSIS — I10 Essential (primary) hypertension: Secondary | ICD-10-CM

## 2014-06-20 DIAGNOSIS — E785 Hyperlipidemia, unspecified: Secondary | ICD-10-CM

## 2014-06-20 DIAGNOSIS — I4891 Unspecified atrial fibrillation: Secondary | ICD-10-CM

## 2014-06-20 DIAGNOSIS — E119 Type 2 diabetes mellitus without complications: Secondary | ICD-10-CM

## 2014-06-20 NOTE — Assessment & Plan Note (Signed)
Cholesterol is at goal on the current lipid regimen. No changes to the medications were made.  

## 2014-06-20 NOTE — Progress Notes (Signed)
Patient ID: Rhonda Hurley, female    DOB: February 12, 1927, 80 y.o.   MRN: [id number]  HPI Comments: Rhonda Hurley is a very pleasant 80 year old woman with a history of paroxysmal atrial fibrillation, prior TIA in March 2014,  diabetes, hypertension, hypothyroidism, echocardiogram read by outside cardiologist suggesting moderate mitral valve regurgitation and diastolic dysfunction who presents for routine followup.  She has been taking flecainide 50 mg twice a day   In followup today, she denies any symptoms concerning for atrial fibrillation  TIA episode with hospital admission 11/14/2012. She recovered well. Head scans did not show large stroke. She was in atrial fibrillation at that time. She had refused anticoagulation in the past as she had bleeding of her gums. Now she takes anticoagulation. No recent bleeding  Previous lab work Hemoglobin A1c 7.4. Total cholesterol 159, LDL 83 She is tring to work on her diet and weight loss Is concerned of the cost of the Eliquis, now $150 per month  Previous  Echocardiogram done in the hospital showed ejection fraction 50-55%, possible hypokinesis of anterior and anteroseptal wall, mild to moderate LVH, normal right ventricular systolic pressures  Remote cardiac catheterization several years ago when she first developed atrial fibrillation with no significant disease by report Echocardiogram dated July 2008 showing normal systolic function, moderate MR, mild TR, diastolic dysfunction.  Lab work October 2013 showing hemoglobin A1c 7.3  EKG shows normal sinus rhythm with rate of 64 beats per minute,  left bundle branch block      Outpatient Encounter Prescriptions as of 06/20/2014  Medication Sig  . Cholecalciferol (VITAMIN D3) 1000 UNITS CAPS Take 1,000 Units by mouth 1 dose over 24 hours.    Marland Kitchen ELIQUIS 5 MG TABS tablet TAKE 1 TABLET (5 MG TOTAL) BY MOUTH 2 (TWO) TIMES DAILY.  . flecainide (TAMBOCOR) 50 MG tablet TAKE 1 TABLET BY MOUTH 2 TIMES  DAILY.  . hydrochlorothiazide 25 MG tablet Take 25 mg by mouth daily.    Marland Kitchen levothyroxine (SYNTHROID, LEVOTHROID) 125 MCG tablet Take 125 mcg by mouth daily.  . metFORMIN (GLUMETZA) 500 MG (MOD) 24 hr tablet Take 500 mg by mouth 3 (three) times daily.   . metoprolol (LOPRESSOR) 50 MG tablet Take by mouth 2 (two) times daily.   . ramipril (ALTACE) 10 MG tablet Take 10 mg by mouth daily.    . simvastatin (ZOCOR) 20 MG tablet Take 20 mg by mouth at bedtime.    . timolol (TIMOPTIC) 0.5 % ophthalmic solution 1 drop 2 (two) times daily.      Review of Systems  Constitutional: Negative.   HENT: Negative.   Eyes: Negative.   Respiratory: Negative.   Cardiovascular: Positive for palpitations.  Gastrointestinal: Negative.   Endocrine: Negative.   Musculoskeletal: Negative.   Skin: Negative.   Allergic/Immunologic: Negative.   Neurological: Negative.   Hematological: Negative.   Psychiatric/Behavioral: Negative.   All other systems reviewed and are negative.   BP 140/78  Pulse 64  Ht 5' 4.5" (1.638 m)  Wt 176 lb (79.833 kg)  BMI 29.75 kg/m2  Physical Exam  Nursing note and vitals reviewed. Constitutional: She is oriented to person, place, and time. She appears well-developed and well-nourished.  HENT:  Head: Normocephalic.  Nose: Nose normal.  Mouth/Throat: Oropharynx is clear and moist.  Eyes: Conjunctivae are normal. Pupils are equal, round, and reactive to light.  Neck: Normal range of motion. Neck supple. No JVD present.  Cardiovascular: Normal rate, regular rhythm, S1 normal, S2 normal and intact  distal pulses.  Exam reveals no gallop and no friction rub.   Murmur heard.  Crescendo systolic murmur is present with a grade of 2/6  Pulmonary/Chest: Effort normal and breath sounds normal. No respiratory distress. She has no wheezes. She has no rales. She exhibits no tenderness.  Abdominal: Soft. Bowel sounds are normal. She exhibits no distension. There is no tenderness.   Musculoskeletal: Normal range of motion. She exhibits no edema and no tenderness.  Lymphadenopathy:    She has no cervical adenopathy.  Neurological: She is alert and oriented to person, place, and time. Coordination normal.  Skin: Skin is warm and dry. No rash noted. No erythema.  Psychiatric: She has a normal mood and affect. Her behavior is normal. Judgment and thought content normal.    Assessment and Plan       a

## 2014-06-20 NOTE — Assessment & Plan Note (Signed)
Blood pressure is well controlled on today's visit. No changes made to the medications. 

## 2014-06-20 NOTE — Assessment & Plan Note (Signed)
No symptoms concerning for atrial fibrillation. We'll continue her on flecainide

## 2014-06-20 NOTE — Patient Instructions (Addendum)
Your next appointment will be scheduled in our new office located at :  [facility]  [address] [place], [place] [address]  You are doing well. No medication changes were made.  Please call us if you have new issues that need to be addressed before your next appt.  Your physician wants you to follow-up in: 6 months.  You will receive a reminder letter in the mail two months in advance. If you don't receive a letter, please call our office to schedule the follow-up appointment.

## 2014-06-20 NOTE — Assessment & Plan Note (Signed)
We have encouraged continued exercise, careful diet management in an effort to lose weight. 

## 2014-08-01 ENCOUNTER — Telehealth: Payer: Self-pay | Admitting: Cardiovascular Disease

## 2014-08-01 NOTE — Telephone Encounter (Signed)
Pt would like some samples of Eliquis, please call patient when it can be picked up.

## 2014-08-01 NOTE — Telephone Encounter (Signed)
Samples @ front desk for pick up.

## 2014-08-31 ENCOUNTER — Ambulatory Visit: Payer: Self-pay | Admitting: Family Medicine

## 2014-10-14 DIAGNOSIS — E039 Hypothyroidism, unspecified: Secondary | ICD-10-CM | POA: Insufficient documentation

## 2014-11-11 ENCOUNTER — Other Ambulatory Visit: Payer: Self-pay | Admitting: Cardiovascular Disease

## 2014-11-29 ENCOUNTER — Emergency Department: Payer: Self-pay | Admitting: Emergency Medicine

## 2014-11-29 LAB — BASIC METABOLIC PANEL
Anion Gap: 10 (ref 7–16)
BUN: 20 mg/dL
CALCIUM: 9.3 mg/dL
Chloride: 103 mmol/L
Co2: 26 mmol/L
Creatinine: 0.84 mg/dL
EGFR (African American): >60
EGFR (Non-African Amer.): >60
Glucose: 148 mg/dL — ABNORMAL HIGH
Potassium: 3.5 mmol/L
Sodium: 139 mmol/L

## 2014-11-29 LAB — CBC
HCT: 37.1 % (ref 35.0–47.0)
HGB: 12.3 g/dL (ref 12.0–16.0)
MCH: 29.3 pg (ref 26.0–34.0)
MCHC: 33.1 g/dL (ref 32.0–36.0)
MCV: 89 fL (ref 80–100)
Platelet: 249 10*3/uL (ref 150–440)
RBC: 4.19 10*6/uL (ref 3.80–5.20)
RDW: 14.8 % — AB (ref 11.5–14.5)
WBC: 15.5 10*3/uL — AB (ref 3.6–11.0)

## 2014-11-30 LAB — APTT: Activated PTT: 30.8 secs (ref 23.6–35.9)

## 2014-11-30 LAB — PROTIME-INR
INR: 1.1
Prothrombin Time: 14.8 secs

## 2014-12-02 DIAGNOSIS — S86919A Strain of unspecified muscle(s) and tendon(s) at lower leg level, unspecified leg, initial encounter: Secondary | ICD-10-CM | POA: Insufficient documentation

## 2014-12-02 DIAGNOSIS — M179 Osteoarthritis of knee, unspecified: Secondary | ICD-10-CM | POA: Insufficient documentation

## 2014-12-02 DIAGNOSIS — M1710 Unilateral primary osteoarthritis, unspecified knee: Secondary | ICD-10-CM | POA: Insufficient documentation

## 2014-12-09 ENCOUNTER — Ambulatory Visit
Admit: 2014-12-09 | Disposition: A | Discharge status: Home / Self Care | Payer: Self-pay | Attending: Surgery | Admitting: Surgery

## 2014-12-12 DIAGNOSIS — M23329 Other meniscus derangements, posterior horn of medial meniscus, unspecified knee: Secondary | ICD-10-CM | POA: Insufficient documentation

## 2014-12-21 ENCOUNTER — Encounter: Payer: Self-pay | Admitting: Cardiovascular Disease

## 2014-12-21 ENCOUNTER — Ambulatory Visit (INDEPENDENT_AMBULATORY_CARE_PROVIDER_SITE_OTHER): Payer: PPO | Admitting: Cardiovascular Disease

## 2014-12-21 VITALS — BP 120/58 | HR 57 | Ht 64.0 in | Wt 160.0 lb

## 2014-12-21 DIAGNOSIS — E785 Hyperlipidemia, unspecified: Secondary | ICD-10-CM | POA: Diagnosis not present

## 2014-12-21 DIAGNOSIS — I4891 Unspecified atrial fibrillation: Secondary | ICD-10-CM | POA: Diagnosis not present

## 2014-12-21 DIAGNOSIS — I10 Essential (primary) hypertension: Secondary | ICD-10-CM | POA: Diagnosis not present

## 2014-12-21 DIAGNOSIS — E119 Type 2 diabetes mellitus without complications: Secondary | ICD-10-CM | POA: Diagnosis not present

## 2014-12-21 NOTE — Progress Notes (Signed)
Patient ID: Rhonda Hurley, female    DOB: [birth year], 81 y.o.   MRN: [id number]  HPI Comments: [name] is a very pleasant 81 year old woman with a history of paroxysmal atrial fibrillation, prior TIA in March 2014,  diabetes, hypertension, hypothyroidism, echocardiogram read by outside cardiologist suggesting moderate mitral valve regurgitation and diastolic dysfunction who presents for routine followup of her atrial fibrillation  She has been taking flecainide 50 mg twice a day   In followup today, she reports having episode of atrial fibrillation 10 days ago. She had fullness in her chest, it lasted less than 24 hours. She took extra flecainide and eventually symptoms improved. She reports a 16 pound weight loss since her last clinic visit. She is uncertain why but attributes it possibly to a lower dose thyroid medication. She's had difficulty with her left knee, osteoarthritis, seen by orthopedics, had a cortisone shot. She is concerned about coming off blood thinners if the surgery is needed. Blood pressure has been running lower at home  EKG on today's visit shows normal sinus rhythm with rate 57 bpm, left bundle branch block  Other past medical history TIA episode with hospital admission 11/14/2012. She recovered well. Head scans did not show large stroke. She was in atrial fibrillation at that time. She had refused anticoagulation in the past as she had bleeding of her gums. Now she takes anticoagulation. No recent bleeding Previous lab work Hemoglobin A1c 7.4. Total cholesterol 159, LDL 83  Previous  Echocardiogram done in the hospital showed ejection fraction 50-55%, possible hypokinesis of anterior and anteroseptal wall, mild to moderate LVH, normal right ventricular systolic pressures  Remote cardiac catheterization several years ago when she first developed atrial fibrillation with no significant disease by report Echocardiogram dated July 2008 showing normal systolic  function, moderate MR, mild TR, diastolic dysfunction.    Allergies  Allergen Reactions  . Ciprocin-Fluocin-Procin [Fluocinolone Acetonide]     Rash on ankles    Outpatient Encounter Prescriptions as of 12/21/2014  Medication Sig  . Cholecalciferol (VITAMIN D3) 1000 UNITS CAPS Take 1,000 Units by mouth 1 dose over 24 hours.    Marland Kitchen ELIQUIS 5 MG TABS tablet TAKE 1 TABLET (5 MG TOTAL) BY MOUTH 2 (TWO) TIMES DAILY.  . flecainide (TAMBOCOR) 50 MG tablet TAKE 1 TABLET BY MOUTH 2 TIMES DAILY.  . hydrochlorothiazide 25 MG tablet Take 25 mg by mouth daily.    Marland Kitchen levothyroxine (SYNTHROID, LEVOTHROID) 125 MCG tablet Take 125 mcg by mouth daily.  . metFORMIN (GLUMETZA) 500 MG (MOD) 24 hr tablet Take 500 mg by mouth 3 (three) times daily.   . metoprolol (LOPRESSOR) 50 MG tablet Take by mouth 2 (two) times daily.   . ramipril (ALTACE) 10 MG tablet Take 10 mg by mouth daily.    . simvastatin (ZOCOR) 20 MG tablet Take 20 mg by mouth at bedtime.    . timolol (TIMOPTIC) 0.5 % ophthalmic solution 1 drop 2 (two) times daily.    . [DISCONTINUED] ELIQUIS 5 MG TABS tablet TAKE 1 TABLET (5 MG TOTAL) BY MOUTH 2 (TWO) TIMES DAILY. (Patient not taking: Reported on 12/21/2014)    Past Medical History  Diagnosis Date  . Type II or unspecified type diabetes mellitus without mention of complication, not stated as uncontrolled   . Mitral valve disorders   . Essential hypertension, benign   . Atrial fibrillation   . Other and unspecified hyperlipidemia   . Other and unspecified hyperlipidemia   . Unspecified hypothyroidism   .  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 Acute cerebrovascular accident   . TIA (transient ischemic attack) 2014    Past Surgical History  Procedure Laterality Date  . Splenectomy    . Breast lumpectomy  12/1996    left   . Radioactive ablation   1999    thyroid glan  . Cataract extraction  2000    left  . Cataract extraction  11/2010    right    Social History  reports  that she has never smoked. She has never used smokeless tobacco. She reports that she does not drink alcohol or use illicit drugs.  Family History Family history is unknown by patient.  Review of Systems  Constitutional: Negative.   Respiratory: Negative.   Cardiovascular: Positive for palpitations.  Gastrointestinal: Negative.   Endocrine: Negative.   Musculoskeletal: Negative.   Skin: Negative.   Neurological: Negative.   Hematological: Negative.   Psychiatric/Behavioral: Negative.   All other systems reviewed and are negative.   BP 120/58 mmHg  Pulse 57  Ht 5\' 4"  (1.626 m)  Wt 160 lb (72.576 kg)  BMI 27.45 kg/m2  Physical Exam  Constitutional: She is oriented to person, place, and time. She appears well-developed and well-nourished.  HENT:  Head: Normocephalic.  Nose: Nose normal.  Mouth/Throat: Oropharynx is clear and moist.  Eyes: Conjunctivae are normal. Pupils are equal, round, and reactive to light.  Neck: Normal range of motion. Neck supple. No JVD present.  Cardiovascular: Normal rate, regular rhythm, S1 normal, S2 normal and intact distal pulses.  Exam reveals no gallop and no friction rub.   Murmur heard.  Crescendo systolic murmur is present with a grade of 2/6  Pulmonary/Chest: Effort normal and breath sounds normal. No respiratory distress. She has no wheezes. She has no rales. She exhibits no tenderness.  Abdominal: Soft. Bowel sounds are normal. She exhibits no distension. There is no tenderness.  Musculoskeletal: Normal range of motion. She exhibits no edema or tenderness.  Lymphadenopathy:    She has no cervical adenopathy.  Neurological: She is alert and oriented to person, place, and time. Coordination normal.  Skin: Skin is warm and dry. No rash noted. No erythema.  Psychiatric: She has a normal mood and affect. Her behavior is normal. Judgment and thought content normal.    Assessment and Plan  Nursing note and vitals reviewed.      a

## 2014-12-21 NOTE — Assessment & Plan Note (Signed)
Diabetes managed by primary care. She is on metformin. Sugars may have improved with 16 pound weight loss

## 2014-12-21 NOTE — Assessment & Plan Note (Signed)
Encouraged her to continue on her simvastatin. She has lab work through primary care

## 2014-12-21 NOTE — Assessment & Plan Note (Signed)
Rare episodes of atrial fibrillation, recommended she take extra flecainide as needed for breakthrough arrhythmia

## 2014-12-21 NOTE — Assessment & Plan Note (Signed)
Recent 16 pound weight loss likely contributing to lower blood pressure. Recommended she monitor her blood pressure at home and if this runs low, that she cut the HCTZ in half or hold it altogether

## 2014-12-21 NOTE — Patient Instructions (Addendum)
You are doing well.  Continue to take extra flecainide as needed for breakthrough atrial fibrillation  If blood pressure runs low, Cut the HCTZ in 1/2   If it continues to run low, Either stop the HCTZ or  Cut the ramapril in 1/2 daily  Please call us if you have new issues that need to be addressed before your next appt.  Your physician wants you to follow-up in: 6 months.  You will receive a reminder letter in the mail two months in advance. If you don't receive a letter, please call our office to schedule the follow-up appointment.

## 2014-12-23 NOTE — Consult Note (Signed)
Present Illness Primary Cardiologist:  Dr Rockey Situ reason for consult- afib  HPI:  This is an 81 year old female with known HTN and paroxysmal atrial fibrillation who presents with TIA.  She reports being in her [facility] until this am when she developed symptoms of aphasia. She was eating breakfast with her friends, and one of her friends noticed that she was speaking in a garbled speech, noncoherantly.  The patient reports that her symptoms lasted several minutes and then resolved.  She reports having palpitations similar to her primary afib starting around 9am.  The patient denies any headache, any nausea, any vomiting, any fevers, chills, numbness, tingling or weakness, CP, SOB, dizziness, presyncope or syncope. She was previously on coumadin but discontinued it due to gingival bleeding.  He was prescribed Pradaxa by Dr Rockey Situ but never started this medicine.   SOCIAL HISTORY: No smoking. No alcohol abuse. No illicit drug abuse. Lives at home with her husband.   FAMILY HISTORY: Mother died from liver cancer. Father died from old age.   Medicine/ Allergies: reviewed  ROS- all systems reviewed and negative except as per HPI   Physical Exam:  GEN well developed, well nourished, no acute distress   HEENT pink conjunctivae, Oropharynx clear   NECK supple   RESP normal resp effort  clear BS   CARD Irregular rate and rhythm  No murmur   ABD denies tenderness  denies Flank Tenderness  soft   LYMPH negative neck   EXTR negative edema   SKIN normal to palpation   NEURO motor/sensory function intact   PSYCH alert     Hypercholesterolemia:    Hypothyroidism:    Diabetes Mellitus, Type II (NIDD):    HTN:    Thyroidectomy:        Admit Diagnosis:   TIA: Onset Date: 14-Nov-2012, Status: Active, Description: TIA      Admit Reason:   TIA (transient ischemic attack) (435.9): Status: Active, Coding System: ICD9, Coded Name: Unspecified transient cerebral ischemia  Home  Medications: Medication Instructions Status  ramipril 10 mg oral capsule 1 cap(s) orally once a day Active  hydrochlorothiazide 25 mg oral tablet 1 tab(s) orally once a day Active  metformin 500 mg oral tablet 1 tab(s) orally 3 times a day Active  amlodipine 5 mg oral tablet 1 tab(s) orally once a day Active  levothyroxine 125 mcg (0.125 mg) oral tablet 1 tab(s) orally once a day Active  simvastatin 20 mg oral tablet 1 tab(s) orally once a day (at bedtime) Active  aspirin 81 mg oral tablet 1 tab(s) orally once a day (in the evening) Active  timolol ophthalmic 0.5% ophthalmic solution 1 drop(s) into each eye once a day (at bedtime). Active  flecainide 50 mg oral tablet 1 tab(s) orally 2 times a day Active  Metoprolol Tartrate 50 mg oral tablet 1 tab(s) orally 2 times a day Active   Lab Results: Hepatic:  15-Mar-14 10:28   Bilirubin, Total 0.9  Alkaline Phosphatase 115  SGPT (ALT) 23  SGOT (AST) 23  Total Protein, Serum 7.4  Albumin, Serum 3.6  Routine Chem:  15-Mar-14 10:28   Hemoglobin A1c ([facility])  7.7 (The American Diabetes Association recommends that a primary goal of therapy should be <7% and that physicians should reevaluate the treatment regimen in patients with HbA1c values consistently >8%.)  Glucose, Serum  186  BUN  22  Creatinine (comp) 1.05  Sodium, Serum 138  Potassium, Serum 3.9  Chloride, Serum 106  CO2, Serum 24  Calcium (  Total), Serum 9.1  Osmolality (calc) 284  eGFR (African American)  56  eGFR (Non-African American)  49 (eGFR values <61mL/min/1.73 m2 may be an indication of chronic kidney disease (CKD). Calculated eGFR is useful in patients with stable renal function. The eGFR calculation will not be reliable in acutely ill patients when serum creatinine is changing rapidly. It is not useful in  patients on dialysis. The eGFR calculation may not be applicable to patients at the low and high extremes of body sizes, pregnant women, and vegetarians.)  Anion  Gap 8  Cardiac:  15-Mar-14 10:28   CK, Total 49  CPK-MB, Serum 0.6 (Result(s) reported on 14 Nov 2012 at 11:02AM.)  Troponin I < 0.02 (0.00-0.05 0.05 ng/mL or less: NEGATIVE  Repeat testing in 3-6 hrs  if clinically indicated. >0.05 ng/mL: POTENTIAL  MYOCARDIAL INJURY. Repeat  testing in 3-6 hrs if  clinically indicated. NOTE: An increase or decrease  of 30% or more on serial  testing suggests a  clinically important change)  Routine UA:  15-Mar-14 10:46   Color (UA) Straw  Clarity (UA) Clear  Glucose (UA) Negative  Bilirubin (UA) Negative  Ketones (UA) Negative  Specific Gravity (UA) 1.003  Blood (UA) Negative  pH (UA) 7.0  Protein (UA) Negative  Nitrite (UA) Negative  Leukocyte Esterase (UA) 2+ (Result(s) reported on 14 Nov 2012 at 11:09AM.)  RBC (UA) <1 /HPF  WBC (UA) 2 /HPF  Bacteria (UA) 3+  Epithelial Cells (UA) 1 /HPF  Mucous (UA) PRESENT (Result(s) reported on 14 Nov 2012 at 11:09AM.)  Routine Coag:  15-Mar-14 10:28   Activated PTT (APTT) 30.8 (A HCT value >55% may artifactually increase the APTT. In one study, the increase was an average of 19%. Reference: "Effect on Routine and Special Coagulation Testing Values of Citrate Anticoagulant Adjustment in Patients with High HCT Values." American Journal of Clinical Pathology 2006;126:400-405.)  Prothrombin 13.8  INR 1.0 (INR reference interval applies to patients on anticoagulant therapy. A single INR therapeutic range for coumarins is not optimal for all indications; however, the suggested range for most indications is 2.0 - 3.0. Exceptions to the INR Reference Range may include: Prosthetic heart valves, acute myocardial infarction, prevention of myocardial infarction, and combinations of aspirin and anticoagulant. The need for a higher or lower target INR must be assessed individually. Reference: The Pharmacology and Management of the Vitamin K  antagonists: the seventh ACCP Conference on Antithrombotic  and Thrombolytic Therapy. QIHKV.4259 Sept:126 (3suppl): [id number]. A HCT value >55% may artifactually increase the PT.  In one study,  the increase was an average of 25%. Reference:  "Effect on Routine and Special Coagulation Testing Values of Citrate Anticoagulant Adjustment in Patients with High HCT Values." American Journal of Clinical Pathology 2006;126:400-405.)  Routine Hem:  15-Mar-14 10:28   WBC (CBC)  12.1  RBC (CBC) 4.56  Hemoglobin (CBC) 13.5  Hematocrit (CBC) 40.7  Platelet Count (CBC) 256 (Result(s) reported on 14 Nov 2012 at 10:46AM.)  MCV 89  MCH 29.5  MCHC 33.0  RDW 14.5   EKG:  EKG Interp. by me  afib,  LBBB   Radiology Results: CT:    15-Mar-14 09:57, CT Head Without Contrast  CT Head Without Contrast   REASON FOR EXAM:    stroke symptoms  COMMENTS:       PROCEDURE: CT  - CT HEAD WITHOUT CONTRAST  - Nov 14 2012  9:57AM     RESULT: Comparison:  None    Technique: Multiple axial images from  the foramen magnum to the vertex   were obtained without IV contrast.    Findings:    There is no evidence for mass effect, midline shift, or extra-axial fluid   collections. There is no evidence for space-occupying lesion,   intracranial hemorrhage, or cortical-based area of infarction.   Periventricular and subcortical hypoattenuation is consistent with     chronic small vessel ischemic disease.     The osseous structures are unremarkable.    IMPRESSION:    1. No acute intracranial process.  2. Chronic small vessel ischemic disease.    CT can underestimate ischemia in the first 24 hours after the event. If   there is clinical concern for an acute infarct, a followup MRI or repeat   CT scan in 24 hours may provide additional information.      Dictation Site: 8    Verified By: Gregor Hams, M.D., MD    Iodinated Radiocontrast Dyes: Hives, Itching  Vital Signs/Nurse's Notes: **Vital Signs.:   15-Mar-14 12:34  Vital Signs Type Admission   Temperature Temperature (F) 97.8  Celsius 36.5  Temperature Source oral  Pulse Pulse 94  Systolic BP Systolic BP 440  Diastolic BP (mmHg) Diastolic BP (mmHg) 92  Mean BP 117  Pulse Ox % Pulse Ox % 95  Oxygen Delivery Room Air/ 21 %    Impression This is an 81 year old female with known HTN and paroxysmal atrial fibrillation who presents with TIA.  She has atrial fibrillation with multiple CVA risk factors including age, DM, HTN, and now TIA.  She requires anticoagulation long term.  Given AVEROES data suggesting adequate prevention of stroke with eliquis without significant increase in bleeding risks when compared to ASA, I would favor eliquis $RemoveBefor'5mg'[id number]$  BID for this patient.  Given recent stroke and inadquate anticoagulation present, I would stop flecainide and continue rate control.  Once she has been adequately anticoagulated for 4 weeks, then flecainide could be restarted.  Echo and MRI are pending at this time.  Would not aggressively control BP at this time.   Plan 1. overnight observation 2. stop flecainide 3. stop ASA 4. eliquis $RemoveBe'5mg'[id number]$  BID OK to discharge in am with close outpatient follow-up by Dr Rockey Situ.  I will see as needed over the weekend.   Electronic Signatures: Coralyn Mark (MD)  (Signed 15-Mar-14 17:19)  Authored: General Aspect/Present Illness, History and Physical Exam, Past Medical History, Health Issues, Home Medications, Labs, EKG , Radiology, Allergies, Vital Signs/Nurse's Notes, Impression/Plan   Last Updated: 15-Mar-14 17:19 by Coralyn Mark (MD)

## 2014-12-23 NOTE — H&P (Signed)
PATIENT NAME:  Rhonda Hurley, PROVENCAL MR#:  [id number] DATE OF BIRTH:  1926-10-24  DATE OF ADMISSION:  11/14/2012  PRIMARY CARE PHYSICIAN: Dr. Lovie Macadamia.   CARDIOLOGIST: Dr. Ida Rogue.   CHIEF COMPLAINT: Slurred speech.   HISTORY OF PRESENT ILLNESS: This is an 81 year old female, who comes into the Emergency Room due to slurred speech that began earlier this morning. The patient was out to breakfast with her friends, and one of her friends noticed that she asked her a question, and she was speaking in a garbled speech and not making any sense. It lasted about maybe 20 to 30 seconds and shortly resolved. She also felt that she did not feel like herself shortly after that happened; therefore, EMS was called, and she was brought to the Emergency Room. The patient is now back to her baseline with no further slurred speech or any weakness. The patient denies any headache, any nausea, any vomiting, any fevers, chills, numbness, tingling or weakness anywhere. She does have a history of chronic atrial fibrillation and is currently in A.fib but is not on long-term anticoagulation. Hospitalist services were contacted for further treatment and evaluation.   REVIEW OF SYSTEMS:  CONSTITUTIONAL: No documented fever. No weight gain. No weight loss.  EYES: No blurry or double vision.  ENT: No tinnitus. No postnasal drip. No redness of the oropharynx.  RESPIRATORY: No cough. No wheeze. No hemoptysis. No dyspnea.  CARDIOVASCULAR: No chest pain. No orthopnea. No palpitations. No syncope.  GASTROINTESTINAL: No nausea. No vomiting. No diarrhea. No abdominal pain. No melena or hematochezia.  GENITOURINARY: No dysuria or hematuria.  ENDOCRINE: No polyuria or nocturia. No heat or cold intolerance.  HEMATOLOGIC: No anemia. No bruising. No bleeding.  INTEGUMENTARY: No rashes. No lesions.  MUSCULOSKELETAL: No arthritis. No swelling. No gout.  NEUROLOGIC: No numbness or tingling. No ataxia. Positive aphasia. Positive  TIA.  PSYCHIATRIC: No anxiety. No insomnia. No ADD.  PAST MEDICAL HISTORY: Consistent with diabetes, hypertension, hyperlipidemia, history of chronic A-fib, glaucoma.   ALLERGIES: TO RADIOACTIVE DYE.   SOCIAL HISTORY: No smoking. No alcohol abuse. No illicit drug abuse. Lives at home with her husband.   FAMILY HISTORY: Mother died from liver cancer. Father died from old age.   CURRENT MEDICATIONS:  As follows: 1.  Amlodipine 5 mg daily.  2.  Aspirin 81 mg daily.  3.  Flecainide 50 mg b.i.d. 4.  Hydrochlorothiazide 25 mg daily.  5. Synthroid 125 mcg daily.  6.  Metformin 5 mg t.i.d. 7.  Metoprolol tartrate 50 mg b.i.d. 8.  Ramipril 10 mg daily.  9.  Simvastatin 20 mg daily.  10. Timolol 0.5% ophthalmic solution one drop to each eye at bedtime.   PHYSICAL EXAMINATION ON ADMISSION:  As follows: VITAL SIGNS: Temperature is 98.6, pulse 92, respirations 20, blood pressure 175/90, sats 95% on room air.  GENERAL: She is a pleasant-appearing female in no apparent distress.  HEENT EXAM: She is atraumatic, normocephalic. Extraocular muscles are intact. Pupils equal and reactive to light. Sclerae anicteric. No conjunctival injection. No pharyngeal erythema.  NECK: Supple. There is no jugular venous distention. No bruits. No lymphadenopathy or thyromegaly.  HEART EXAM: Irregular. No murmurs. No rubs. No clicks.  LUNGS: Clear to auscultation bilaterally. No rales. No rhonchi. No wheezes.  ABDOMEN: Soft, flat, nontender and nondistended. Has good bowel sounds. No hepatosplenomegaly appreciated.  EXTREMITIES: No evidence of any cyanosis, clubbing or peripheral edema. Has +2 pedal and radial pulses bilaterally.  NEUROLOGICAL: The patient is alert, awake and oriented  x 3 with no focal motor or sensory deficits appreciated bilaterally.  SKIN EXAM: Moist, warm, but no rashes appreciated.  LYMPHATIC: There is no cervical or axillary lymphadenopathy.   LABORATORY EXAMINATION: Serum glucose is 186,  BUN 22, creatinine 1.05, sodium 138, potassium 3.9, chloride 106, bicarb 24. The patient's LFTs are within normal limits. Troponin less than 0.02. White cell count 12.1, hemoglobin 13.5, hematocrit 40.7, platelet count 256. INR 1.0.   The patient did have a CT of the head done, which showed no acute intracranial abnormality. Chronic small muscle ischemic disease. A chest x-ray showing no evidence of any acute cardiopulmonary disease.   ASSESSMENT AND PLAN: This is an 81 year old female with a history of diabetes, hypertension, hyperlipidemia, chronic atrial fibrillation, hypothyroidism, glaucoma, who presents to the hospital with aphasia, which has now resolved, likely here with a transient ischemic attack.  1.  Transient ischemic attack/cerebrovascular accident. This is the likely diagnosis given the patient's acute aphasia, which has now resolved. Her CT head is negative, but she is a high risk patient given the fact that she has chronic A-fib and not on long-term anticoagulation. I will go ahead and get an MRI of her brain, get a carotid duplex, get a 2-dimensional echocardiogram. Her CHADS-VASc2 score is 7. That puts her at high risk for stroke at about 9.6%. She likely needs to be on long-term anticoagulation. I discussed the case with Dr. Rayann Heman from [facility], and we will start the patient on Eliquis for now.  2.  History of chronic atrial fibrillation. Continue flecainide and metoprolol for rate control. She likely needs to be started on long-term anticoagulation as discussed above. Her CHADS-VASc2 score is 7. We will start the patient on Eliquis as discussed with Dr. Rayann Heman.  3.  Hypothyroidism. Continue Synthroid.  4.  Hypertension. Allow some permissive hypertension given the possibly of acute stroke. Continue metoprolol, hydrochlorothiazide, amlodipine and ramipril.  5.  Hyperlipidemia. Continue simvastatin.  6.  Glaucoma. Continue timolol.  7.  Diabetes. Hold her metformin for now.  Place her on sliding scale insulin. Continue carb-controlled diet. Check a hemoglobin A1c.   CODE STATUS: The patient is a FULL CODE.   TIME SPENT: 55 minutes.    ____________________________ Belia Heman. Verdell Carmine, MD vjs:lg D: 11/14/2012 12:00:52 ET T: 11/14/2012 15:50:59 ET JOB#: [id number]  cc: Belia Heman. Verdell Carmine, MD, <Dictator> Henreitta Leber MD ELECTRONICALLY SIGNED 11/16/2012 8:18

## 2014-12-23 NOTE — Discharge Summary (Signed)
PATIENT NAME:  Rhonda Hurley, Rhonda Hurley MR#:  [id number] DATE OF BIRTH:  1927/02/28  DATE OF ADMISSION:  11/14/2012 DATE OF DISCHARGE:  11/15/2012  For a detailed note, please take a look at the history and physical on admission.  DISCHARGE DIAGNOSES: Is as follows: 1.  Acute cerebrovascular accident.  2.  History of chronic atrial fibrillation.  3.  Diabetes.  4.  Hypertension.  5.  Hyperlipidemia.   DIET: The patient is being discharged on a low-sodium, low-fat American Diabetic Association diet.   ACTIVITY: As tolerated.   FOLLOW-UP:  Is with Dr. Ida Rogue in the next 1 to 2 weeks. Also follow up with her primary care physician, Dr. Lovie Macadamia in the next 1 to 2 weeks.   DISCHARGE MEDICATIONS:  Ramipril 10 mg 1 tab daily, hydrochlorothiazide 25 mg daily, metformin 5 mg t.i.d., amlodipine 5 mg daily, Synthroid 125 mcg daily, simvastatin 20 mg daily, timolol 0.5%, ophthalmic solution one drop to each eye at bedtime, metoprolol tartrate 50 mg b.i.d. and apixaban 5 mg b.i.d.   CONSULTANTS DURING THE HOSPITAL COURSE: Dr. Thompson Grayer from cardiology.   PERTINENT STUDIES DONE DURING THE HOSPITAL COURSE:  A CT scan of the head done without contrast on admission showing no acute intracranial process, chronic small vessel ischemic disease.   A chest x-ray done on admission showing basilar opacity secondary to atelectasis.   MRI of the brain done without contrast showing findings concerning for small acute infarct in the left parietal cortex.   A carotid duplex showed no evidence of any hemodynamically significant carotid artery stenosis.   A Two-dimensional echocardiogram, the result of which is still pending.   BRIEF HOSPITAL COURSE: This is an 81 year old female with medical problems as mentioned above, presented to the hospital secondary to slurred speech and aphasia.  1.  Acute cerebrovascular accident. This was the likely cause of the patient's slurred speech and aphasia. Prior to  coming to the hospital, it had completely resolved. She was admitted for work-up of possible transient ischemic attack cerebrovascular accident. Her MRI did confirm a small stroke on the left parietal cortex. She is a high-risk patient, given the fact that she has chronic atrial fibrillation and was not on any anticoagulation prior to coming in. Her CHADS VASC status score was at least 7. The patient was seen by cardiology and started on some anticoagulation. The patient was put on Eliquis. She is currently being discharged on that. She did not require any rehab as she was ambulating fine. She did not have any profound weakness and her slurred speech had improved and she had no dysphagia.  3.  Chronic atrial fibrillation. The patient remained in atrial fibrillation. She is currently on rate controlling meds including metoprolol. She was on flecainide, although that was discontinued as per recommendations by cardiology, Dr. Rayann Heman. She was started on long-term anticoagulation given her CHADS VASC score being at least 7, plus her having an acute stroke. Now, she is being discharged on Eliquis as stated. She will continue follow-up with Dr. Ida Rogue as an outpatient. Her flecainide can likely be resumed after 2 to 3 weeks of anticoagulation therapy, but this is to be further discussed with her cardiologist.  3.  Diabetes. The patient was maintained on some sliding scale insulin, as the metformin was held, but she can resume that upon discharge. Her A1c was 7.7. This can be further followed by her primary care physician. 4.   Hypothyroidism.  The patient was maintained on her Synthroid.  She will resume that.   5.  Hyperlipidemia. The patient was maintained on her simvastatin. She will resume that.  6.  Glaucoma. The patient was maintained on timolol eyedrops. She will also resume that upon discharge.  7.  Hypertension. We allowed for some permissive hypertension, given the possibility of acute stroke,  although she will resume her metoprolol and Norvasc as stated.   CODE STATUS: The patient is a full code.   TIME SPENT: 40 minutes     ____________________________ Belia Heman. Verdell Carmine, MD vjs:cc D: 11/15/2012 13:36:02 ET T: 11/15/2012 21:43:03 ET JOB#: [id number]  cc: Belia Heman. Verdell Carmine, MD, <Dictator> Minna Merritts, MD Youlanda Roys. Lovie Macadamia, MD Henreitta Leber MD ELECTRONICALLY SIGNED 11/16/2012 8:19

## 2015-01-05 ENCOUNTER — Other Ambulatory Visit: Payer: Self-pay | Admitting: Cardiovascular Disease

## 2015-02-27 ENCOUNTER — Other Ambulatory Visit: Payer: Self-pay

## 2015-05-10 ENCOUNTER — Encounter: Payer: Self-pay | Admitting: Cardiovascular Disease

## 2015-05-10 ENCOUNTER — Ambulatory Visit (INDEPENDENT_AMBULATORY_CARE_PROVIDER_SITE_OTHER): Payer: PPO | Admitting: Cardiovascular Disease

## 2015-05-10 ENCOUNTER — Telehealth: Payer: Self-pay | Admitting: Cardiovascular Disease

## 2015-05-10 VITALS — BP 140/86 | HR 73 | Ht 64.5 in | Wt 165.5 lb

## 2015-05-10 DIAGNOSIS — I10 Essential (primary) hypertension: Secondary | ICD-10-CM

## 2015-05-10 DIAGNOSIS — E119 Type 2 diabetes mellitus without complications: Secondary | ICD-10-CM

## 2015-05-10 DIAGNOSIS — R0602 Shortness of breath: Secondary | ICD-10-CM

## 2015-05-10 DIAGNOSIS — R0789 Other chest pain: Secondary | ICD-10-CM | POA: Diagnosis not present

## 2015-05-10 DIAGNOSIS — E785 Hyperlipidemia, unspecified: Secondary | ICD-10-CM

## 2015-05-10 DIAGNOSIS — I480 Paroxysmal atrial fibrillation: Secondary | ICD-10-CM

## 2015-05-10 MED ORDER — RAMIPRIL 10 MG PO CAPS: 10.0000 mg | ORAL_CAPSULE | Freq: Every day | ORAL | Status: DC

## 2015-05-10 MED ORDER — FLECAINIDE ACETATE 100 MG PO TABS: 100.0000 mg | ORAL_TABLET | Freq: Two times a day (BID) | ORAL | Status: DC

## 2015-05-10 NOTE — Assessment & Plan Note (Signed)
She has converted back to atrial fibrillation,. Recommended she increase the flecainide up to 100 mg twice a day Continue her metoprolol. Stay on anticoagulation. Repeat EKG in 2-3 weeks' time

## 2015-05-10 NOTE — Assessment & Plan Note (Signed)
Encouraged her to stay on her simvastatin 20 mg daily 

## 2015-05-10 NOTE — Progress Notes (Signed)
Patient ID: Rhonda Hurley, female    DOB: 1927-02-24, 81 y.o.   MRN: [id number]  HPI Comments: Rhonda Hurley is a very pleasant 81 year old woman with a history of paroxysmal atrial fibrillation, prior TIA in March 2014,  diabetes, hypertension, hypothyroidism, echocardiogram read by outside cardiologist suggesting moderate mitral valve regurgitation and diastolic dysfunction who presents for routine followup of her atrial fibrillation Previous TIA March 2014  In follow-up today, she reports converting to atrial fibrillation approximately one week ago. No precipitating factors. She has mild shortness of breath, does not feel right. Reports her heart rate has not been particularly elevated. Blood pressure has been running high. Prior blood pressure March 2016 was running low and at that time ramipril and HCTZ was cut in half.  she is not taking either on today's visit. In the past she has taken extra flecainide for atrial fibrillation episodes. She has not done so this week  EKG on today's visit shows atrial fibrillation with ventricular rate 80 bpm, left bundle branch block  Other past medical history  having episode of atrial fibrillation 10 days ago.  TIA episode with hospital admission 11/14/2012. She recovered well. Head scans did not show large stroke. She was in atrial fibrillation at that time. She had refused anticoagulation in the past as she had bleeding of her gums. Now she takes anticoagulation. No recent bleeding Previous lab work Hemoglobin A1c 7.4. Total cholesterol 159, LDL 83  Previous  Echocardiogram done in the hospital showed ejection fraction 50-55%, possible hypokinesis of anterior and anteroseptal wall, mild to moderate LVH, normal right ventricular systolic pressures  Remote cardiac catheterization several years ago when she first developed atrial fibrillation with no significant disease by report Echocardiogram dated July 2008 showing normal systolic function,  moderate MR, mild TR, diastolic dysfunction.    Allergies  Allergen Reactions  . Ciprocin-Fluocin-Procin [Fluocinolone Acetonide]     Rash on ankles    Outpatient Encounter Prescriptions as of 05/10/2015  Medication Sig  . Cyanocobalamin (VITAMIN B 12 PO) Take 800 Units by mouth daily.  Marland Kitchen ELIQUIS 5 MG TABS tablet TAKE 1 TABLET (5 MG TOTAL) BY MOUTH 2 (TWO) TIMES DAILY.  . flecainide (TAMBOCOR) 50 MG tablet TAKE 1 TABLET BY MOUTH 2 TIMES DAILY.  Marland Kitchen levothyroxine (SYNTHROID, LEVOTHROID) 125 MCG tablet Take 125 mcg by mouth daily.  . metFORMIN (GLUMETZA) 500 MG (MOD) 24 hr tablet Take 500 mg by mouth 3 (three) times daily.   . metoprolol (LOPRESSOR) 50 MG tablet Take by mouth 2 (two) times daily.   . simvastatin (ZOCOR) 20 MG tablet Take 20 mg by mouth at bedtime.    . timolol (TIMOPTIC) 0.5 % ophthalmic solution 1 drop 2 (two) times daily.    . [DISCONTINUED] Cholecalciferol (VITAMIN D3) 1000 UNITS CAPS Take 1,000 Units by mouth 1 dose over 24 hours.    . flecainide (TAMBOCOR) 100 MG tablet Take 1 tablet (100 mg total) by mouth 2 (two) times daily.  . ramipril (ALTACE) 10 MG capsule Take 1 capsule (10 mg total) by mouth daily.  . [DISCONTINUED] hydrochlorothiazide 25 MG tablet Take 25 mg by mouth daily.    . [DISCONTINUED] ramipril (ALTACE) 10 MG tablet Take 10 mg by mouth daily.     No facility-administered encounter medications on file as of 05/10/2015.    Past Medical History  Diagnosis Date  . Type II or unspecified type diabetes mellitus without mention of complication, not stated as uncontrolled   . Mitral valve disorders   .  Essential hypertension, benign   . Atrial fibrillation   . Other and unspecified hyperlipidemia   . Other and unspecified hyperlipidemia   . Unspecified hypothyroidism   . 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 Acute cerebrovascular accident   . TIA (transient ischemic attack) 2014    Past Surgical History  Procedure Laterality Date  .  Splenectomy    . Breast lumpectomy  12/1996    left   . Radioactive ablation   1999    thyroid glan  . Cataract extraction  2000    left  . Cataract extraction  11/2010    right    Social History  reports that she has never smoked. She has never used smokeless tobacco. She reports that she does not drink alcohol or use illicit drugs.  Family History Family history is unknown by patient.  Review of Systems  Constitutional: Positive for fatigue.  Respiratory: Negative.   Cardiovascular: Positive for palpitations.  Gastrointestinal: Negative.   Endocrine: Negative.   Musculoskeletal: Negative.   Skin: Negative.   Neurological: Negative.   Hematological: Negative.   Psychiatric/Behavioral: Negative.   All other systems reviewed and are negative.   BP 140/86 mmHg  Pulse 73  Ht 5' 4.5" (1.638 m)  Wt 165 lb 8 oz (75.07 kg)  BMI 27.98 kg/m2  Physical Exam  Constitutional: She is oriented to person, place, and time. She appears well-developed and well-nourished.  HENT:  Head: Normocephalic.  Nose: Nose normal.  Mouth/Throat: Oropharynx is clear and moist.  Eyes: Conjunctivae are normal. Pupils are equal, round, and reactive to light.  Neck: Normal range of motion. Neck supple. No JVD present.  Cardiovascular: Normal rate, S1 normal, S2 normal and intact distal pulses.  An irregularly irregular rhythm present. Exam reveals no gallop and no friction rub.   Murmur heard.  Crescendo systolic murmur is present with a grade of 2/6  Pulmonary/Chest: Effort normal and breath sounds normal. No respiratory distress. She has no wheezes. She has no rales. She exhibits no tenderness.  Abdominal: Soft. Bowel sounds are normal. She exhibits no distension. There is no tenderness.  Musculoskeletal: Normal range of motion. She exhibits no edema or tenderness.  Lymphadenopathy:    She has no cervical adenopathy.  Neurological: She is alert and oriented to person, place, and time. Coordination  normal.  Skin: Skin is warm and dry. No rash noted. No erythema.  Psychiatric: She has a normal mood and affect. Her behavior is normal. Judgment and thought content normal.    Assessment and Plan  Nursing note and vitals reviewed.      a

## 2015-05-10 NOTE — Assessment & Plan Note (Signed)
Blood pressure has been running high. Recommended she restart her ramipril If blood pressure continues to run high, would start HCTZ either every other day or daily

## 2015-05-10 NOTE — Assessment & Plan Note (Signed)
We have encouraged continued exercise, careful diet management   

## 2015-05-10 NOTE — Telephone Encounter (Signed)
Pt worked in to see Dr. Rockey Situ today at 11:20.

## 2015-05-10 NOTE — Telephone Encounter (Signed)
Pt c/o BP issue: STAT if pt c/o blurred vision, one-sided weakness or slurred speech  1. What are your last 5 BP readings? Starting 08/24 patient says she wasn't feeling good bp has been trending 200/100 180/100 180/90   ASKED TO BRING COPY OF LOG TO APPT  2. Are you having any other symptoms (ex. Dizziness, headache, blurred vision, passed out)? no  3. What is your BP issue? hctz and rimiprimil were dc in April  Audrey rn [phone] at [facility] called to have patient seen .  Scheduled in opening for today 9/7 at 1120

## 2015-05-10 NOTE — Patient Instructions (Addendum)
You are in atrial fibrillation  Please increase the flecainide up to 100 mg twice a day  Restart ramipril daily  If blood pressure continues to run high, Take HCTZ every other day or daily with banana or potassium  Please call us if you have new issues that need to be addressed before your next appt.  Your physician wants you to follow-up in: 3  weeks

## 2015-06-08 ENCOUNTER — Ambulatory Visit: Payer: PPO | Admitting: Physician Assistant

## 2015-06-09 ENCOUNTER — Telehealth: Payer: Self-pay

## 2015-06-09 ENCOUNTER — Ambulatory Visit (INDEPENDENT_AMBULATORY_CARE_PROVIDER_SITE_OTHER): Payer: PPO | Admitting: Physician Assistant

## 2015-06-09 ENCOUNTER — Encounter: Payer: Self-pay | Admitting: Physician Assistant

## 2015-06-09 VITALS — BP 130/70 | HR 80 | Ht 65.5 in | Wt 168.2 lb

## 2015-06-09 DIAGNOSIS — E0580 Other thyrotoxicosis without thyrotoxic crisis or storm: Secondary | ICD-10-CM

## 2015-06-09 DIAGNOSIS — R0602 Shortness of breath: Secondary | ICD-10-CM | POA: Diagnosis not present

## 2015-06-09 DIAGNOSIS — I10 Essential (primary) hypertension: Secondary | ICD-10-CM | POA: Diagnosis not present

## 2015-06-09 DIAGNOSIS — I480 Paroxysmal atrial fibrillation: Secondary | ICD-10-CM

## 2015-06-09 DIAGNOSIS — E785 Hyperlipidemia, unspecified: Secondary | ICD-10-CM

## 2015-06-09 MED ORDER — METOPROLOL TARTRATE 75 MG PO TABS: 75.0000 mg | ORAL_TABLET | Freq: Two times a day (BID) | ORAL | Status: DC

## 2015-06-09 NOTE — Telephone Encounter (Signed)
S/w pt husband to notify pt of change in date for DCCV. Pt is not home. Will CB

## 2015-06-09 NOTE — Patient Instructions (Addendum)
Medication Instructions:  Your physician has recommended you make the following change in your medication:  INCREASE metoprolol to 75mg  twice per day   Labwork: BMET, CBC, PT/INR  Testing/Procedures: Your physician has recommended that you have a Cardioversion (DCCV). Electrical Cardioversion uses a jolt of electricity to your heart either through paddles or wired patches attached to your chest. This is a controlled, usually prescheduled, procedure. Defibrillation is done under light anesthesia in the hospital, and you usually go home the day of the procedure. This is done to get your heart back into a normal rhythm. You are not awake for the procedure. Please see the instruction sheet given to you today.  You are scheduled for a Cardioversion on Thursday, Oct 13, 7:30am with Dr Rockey Situ. Please arrive at the [facility] at 6:30a.m. on the day of your procedure.  DIET INSTRUCTIONS:  Nothing to eat or drink after midnight except your medications with a  sip of water.         1) Labs: BMET, CBC, PT/INR  2) Medications:  YOU MAY TAKE ALL of your remaining medications with a small amount of water.  3) Must have a responsible person to drive you home.  4) Bring a current list of your medications and current insurance cards.    If you have any questions after you get home, please call the office at [phone]   Follow-Up: Your physician recommends that you schedule a follow-up appointment in: one month with Christell Faith, PA-C   Any Other Special Instructions Will Be Listed Below (If Applicable). Electrical Cardioversion Electrical cardioversion is the delivery of a jolt of electricity to change the rhythm of the heart. Sticky patches or metal paddles are placed on the chest to deliver the electricity from a device. This is done to restore a normal rhythm. A rhythm that is too fast or not regular keeps the heart from pumping well. Electrical cardioversion is done in an emergency if:    There is low or no blood pressure as a result of the heart rhythm.   Normal rhythm must be restored as fast as possible to protect the brain and heart from further damage.   It may save a life. Cardioversion may be done for heart rhythms that are not immediately life threatening, such as atrial fibrillation or flutter, in which:   The heart is beating too fast or is not regular.   Medicine to change the rhythm has not worked.   It is safe to wait in order to allow time for preparation.  Symptoms of the abnormal rhythm are bothersome.  The risk of stroke and other serious problems can be reduced. LET [facility] CARE PROVIDER KNOW ABOUT:   Any allergies you have.  All medicines you are taking, including vitamins, herbs, eye drops, creams, and over-the-counter medicines.  Previous problems you or members of your family have had with the use of anesthetics.   Any blood disorders you have.   Previous surgeries you have had.   Medical conditions you have. RISKS AND COMPLICATIONS  Generally, this is a safe procedure. However, problems can occur and include:   Breathing problems related to the anesthetic used.  A blood clot that breaks free and travels to other parts of your body. This could cause a stroke or other problems. The risk of this is lowered by use of blood-thinning medicine (anticoagulant) prior to the procedure.  Cardiac arrest (rare). BEFORE THE PROCEDURE   You may have tests  to detect blood clots in your heart and to evaluate heart function.  You may start taking anticoagulants so your blood does not clot as easily.   Medicines may be given to help stabilize your heart rate and rhythm. PROCEDURE  You will be given medicine through an IV tube to reduce discomfort and make you sleepy (sedative).   An electrical shock will be delivered. AFTER THE PROCEDURE Your heart rhythm will be watched to make sure it does not change.    This information is  not intended to replace advice given to you by your health care provider. Make sure you discuss any questions you have with your health care provider.   Document Released: 08/09/2002 Document Revised: 09/09/2014 Document Reviewed: 03/03/2013 Elsevier Interactive Patient Education [employer].

## 2015-06-09 NOTE — Progress Notes (Signed)
Cardiology Office Note:  Date of Encounter: 06/09/2015  ID: Rhonda Hurley, DOB September 10, 1926, MRN [id number]  PCP:  Juluis Pitch, MD Primary Cardiologist:  Dr. Rockey Situ, MD  Chief Complaint  Patient presents with  . other    3 week follow up. Meds reviewed by the patient verbally.     HPI:  81 year old female with history of PAF on Eliquis, TIA/possible stroke in 10/2012, DM2, HTN, and hypothyroidism who presents for follow up of   Remote cardiac catheterization several years ago showed no significant disease by report. Prior echo in July 2008 showed normal LV function, moderate MR, mild TR, and DD. Subsequent echo during hospitalization in 10/2012 in the setting of TIA showed EF 50-55%, possible HK of anterior and anteroseptal wall. At least mild and possible moderate LVH. Normal RVSP. MRI brain at that time was concerning for posible smal acute infarct along the left parietal cortex. She had previously refused anticoagulation in the past 2/2 bleeding in the gums, now she takes anticoagulation.   She was seen on 05/10/2015 with reporting going back into Afib the week prior. No precipitating factors. ECG at taht visit showed Afib with HR of 80 bpm, LBBB. Mild SOB at that time. BP elevated in the 595'G systolic. In the past she had taken an extra dose of flecainide when she went into Afib, at the time of her 9/7 visit she had not done this. It was recommended she increase her flecainide up to 100 mg bid, continue her metoprolol, and Eliquis. She was also started on ramipril given her elevated BP. She was advised to follow up in 2-3 weeks for ECG.   She is improving today, though is still mildly SOB at times and does also feel palpitations at times, mostly at nighttime when she is laying down. BP is improved today in the 387'F systolic, though at home her BP has been consistently running in the mid 643'P systolic. No chest pain, nausea, vomiting, diaphoresis, LEE, orthopnea, PND, early satiety,  presyncope, or syncope.   Of note, she has not been on levothyroxine 125 mcg since the spring of 2016. In February of 2016 her free T4 was found to be elevated at 1.42. This was decreased over the phone in March to 112 mcg from 125 mcg. Her levels remained elevated, coupled with palpitations and weight loss. Ultimately, she was decreased to 88 mcg and a most recent level being drawn on 05/18/2015 being normal. She has remained on 88 mcg since.     Past Medical History  Diagnosis Date  . Type II or unspecified type diabetes mellitus without mention of complication, not stated as uncontrolled   . Mitral valve disorders     a. echo 03/2007: nl LVEF, mod MR, mmild TR, DD; b. echo 10/2012: EF 50-55%, possible HK of anterior & anteroseptal wall, mild to mod LVH, nl RVSP  . Essential hypertension, benign   . PAF (paroxysmal atrial fibrillation) (HCC)     a. on Eliquis; b. CHADSVASc at least 7 (HTN, DM, age x 2, stroke, female)  . Other and unspecified hyperlipidemia   . Unspecified hypothyroidism   . 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 Acute cerebrovascular accident [facility])     a. 10/2012: MRI concern for posible small acute infarct along the left parietal cortex  . TIA (transient ischemic attack) 2014  :  Past Surgical History  Procedure Laterality Date  . Splenectomy    . Breast lumpectomy  12/1996  left   . Radioactive ablation   1999    thyroid glan  . Cataract extraction  2000    left  . Cataract extraction  11/2010    right  :  Social History:  The patient  reports that she has never smoked. She has never used smokeless tobacco. She reports that she does not drink alcohol or use illicit drugs.   Family History  Problem Relation Age of Onset  . Family history unknown: Yes     Allergies:  Allergies  Allergen Reactions  . Ciprocin-Fluocin-Procin [Fluocinolone Acetonide]     Rash on ankles     Home Medications:  Current Outpatient Prescriptions  Medication  Sig Dispense Refill  . Cholecalciferol (VITAMIN D3) 1000 UNITS CAPS Take 1,000 Units by mouth 2 (two) times daily.     . Cyanocobalamin (RA VITAMIN B-12 TR) 1000 MCG TBCR Take 1,000 mcg by mouth 2 (two) times daily.     Marland Kitchen ELIQUIS 5 MG TABS tablet TAKE 1 TABLET (5 MG TOTAL) BY MOUTH 2 (TWO) TIMES DAILY. 60 tablet 6  . flecainide (TAMBOCOR) 100 MG tablet Take 1 tablet (100 mg total) by mouth 2 (two) times daily. 60 tablet 6  . levothyroxine (SYNTHROID, LEVOTHROID) 88 MCG tablet Take 88 mcg by mouth daily before breakfast.    . metFORMIN (GLUMETZA) 500 MG (MOD) 24 hr tablet Take 500 mg by mouth 3 (three) times daily.     . ramipril (ALTACE) 10 MG capsule Take 1 capsule (10 mg total) by mouth daily. 30 capsule 11  . simvastatin (ZOCOR) 20 MG tablet Take 20 mg by mouth at bedtime.      . timolol (TIMOPTIC) 0.5 % ophthalmic solution 1 drop 2 (two) times daily.      . metoprolol 75 MG TABS Take 75 mg by mouth 2 (two) times daily. 60 tablet 5   No current facility-administered medications for this visit.     Review of Systems:  Review of Systems  Constitutional: Positive for malaise/fatigue. Negative for fever, chills, weight loss and diaphoresis.  HENT: Negative for congestion.   Eyes: Negative for discharge and redness.  Respiratory: Positive for shortness of breath. Negative for cough, hemoptysis, sputum production and wheezing.   Cardiovascular: Positive for palpitations. Negative for chest pain, orthopnea, claudication, leg swelling and PND.  Gastrointestinal: Negative for heartburn, nausea, vomiting and abdominal pain.  Musculoskeletal: Negative for falls.  Skin: Negative for rash.  Neurological: Positive for weakness. Negative for dizziness, tingling, tremors, sensory change, speech change, focal weakness and loss of consciousness.  Endo/Heme/Allergies: Does not bruise/bleed easily.  Psychiatric/Behavioral: The patient is nervous/anxious.   All other systems reviewed and are  negative.    Physical Exam:  Blood pressure 130/70, pulse 80, height 5' 5.5" (1.664 m), weight 168 lb 4 oz (76.318 kg). BMI: Body mass index is 27.56 kg/(m^2). General: Pleasant, NAD. Psych: Normal affect. Responds to questions with normal affect.  Neuro: Alert and oriented X 3. Moves all extremities spontaneously. HEENT: Normocephalic, atraumatic. EOM intact. Sclera anicteric.  Neck: Trachea midline. Supple without bruits or JVD. Lungs:  Respirations regular and unlabored. CTA bilaterally without wheezing, crackles, or rhonchi.  Heart: Irregularly-irregular, normal s3, s4. II/VI systolic murmurs. No rubs, or gallops.  Abdomen: Soft, non-tender, non-distended, BS + x 4.  Extremities: No clubbing, cyanosis or edema. DP/PT/Radials 2+ and equal bilaterally.   Accessory Clinical Findings:  EKG: Afib, 80 bpm, LBBB (old)  Recent Labs: 11/29/2014: BUN 20; Creatinine 0.84; HGB 12.3; Platelet 249; Potassium  3.5; Sodium 139  No results found for requested labs within last 365 days.  CrCl cannot be calculated (Patient has no serum creatinine result on file.).  Weights: Wt Readings from Last 3 Encounters:  06/09/15 168 lb 4 oz (76.318 kg)  05/10/15 165 lb 8 oz (75.07 kg)  12/21/14 160 lb (72.576 kg)    Other studies Reviewed: Additional studies/ records that were reviewed today include: prior office notes.  Assessment & Plan:  1. PAF:  -Rate controlled at 80 bpm today -She remains slightly symptomatic with mild SOB and occasional palpitations  -Increase Lopressor to 75 mg bid, this will also aid with her HTN -Schedule DCCV, verified she has not missed a single dose of her Eliquis within the past 4 weeks. She takes it bid every day -Continue flecainide 100 mg bid -Perhaps the driving force to her increased Afib burden all of the sudden was iatrogenic hyperthyroidism -Levothyroxine 125 mcg was too high per labs and was subsequently decreased to 112 mcg though this was still too high,  and finally she ended up on 88 mcg which has worked out well for her -Continue Eliquis 5 mg bid  -CHADSVASC at least 7 (HTN, age x 2, DM, stroke, female) giving her an estimated annual stroke risk of 9.6%   2. Acquired hypothyroidism s/p radiation: -Over medication as of late, uncertain underlying etiology -Dosage has been decreased by PCP from 125 mcg to 112 mcg and finally to 88 mcg in the setting of iatrogenic hyperthyroidism    3. HTN:  -Improving, though she reports readings at home consistently in the mid 660'Y systolic -Lopressor increased as above -Continue ramipril 10 mg daily  3. DM2:  -Per PCP  4. HLD:  -Simvastatin 20 mg qhs  -If CCB is needed to better control her Afib consider changing simvastatin to Lipitor    Dispo: -Follow up 1 month  Current medicines are reviewed at length with the patient today.  The patient did not have any concerns regarding medicines.   Christell Faith, PA-C [facility] [place] [address], [place] [address] [phone] [facility] 06/09/2015, 4:40 PM

## 2015-06-10 LAB — CBC
Hematocrit: 36.4 % (ref 34.0–46.6)
Hemoglobin: 11.9 g/dL (ref 11.1–15.9)
MCH: 27.9 pg (ref 26.6–33.0)
MCHC: 32.7 g/dL (ref 31.5–35.7)
MCV: 85 fL (ref 79–97)
PLATELETS: 319 10*3/uL (ref 150–379)
RBC: 4.27 x10E6/uL (ref 3.77–5.28)
RDW: 16.1 % — AB (ref 12.3–15.4)
WBC: 8.5 10*3/uL (ref 3.4–10.8)

## 2015-06-10 LAB — BASIC METABOLIC PANEL
BUN/Creatinine Ratio: 19 (ref 11–26)
BUN: 16 mg/dL (ref 8–27)
CALCIUM: 9 mg/dL (ref 8.7–10.3)
CHLORIDE: 101 mmol/L (ref 97–108)
CO2: 21 mmol/L (ref 18–29)
Creatinine, Ser: 0.85 mg/dL (ref 0.57–1.00)
GFR calc Af Amer: 72 mL/min/{1.73_m2} (ref >59)
GFR calc non Af Amer: 62 mL/min/{1.73_m2} (ref >59)
Glucose: 105 mg/dL — ABNORMAL HIGH (ref 65–99)
POTASSIUM: 4.8 mmol/L (ref 3.5–5.2)
Sodium: 141 mmol/L (ref 134–144)

## 2015-06-10 LAB — PROTIME-INR
INR: 1.1 (ref 0.8–1.2)
PROTHROMBIN TIME: 10.9 s (ref 9.1–12.0)

## 2015-06-12 ENCOUNTER — Telehealth: Payer: Self-pay

## 2015-06-12 NOTE — Telephone Encounter (Signed)
S/w pt who is agreeable to DCCV 10/11 at 7:30am Pt understands to arrive at 6:30am Reviewed instructions. Pt verbalized understanding.

## 2015-06-13 ENCOUNTER — Ambulatory Visit: Payer: PPO | Admitting: Anesthesiology

## 2015-06-13 ENCOUNTER — Encounter
Admission: RE | Disposition: A | Discharge status: Home / Self Care | Payer: Self-pay | Source: Ambulatory Visit | Attending: Cardiovascular Disease

## 2015-06-13 ENCOUNTER — Encounter: Payer: Self-pay | Admitting: Anesthesiology

## 2015-06-13 ENCOUNTER — Ambulatory Visit
Admission: RE | Admit: 2015-06-13 | Discharge: 2015-06-13 | Disposition: A | Discharge status: Home / Self Care | Payer: PPO | Source: Ambulatory Visit | Attending: Cardiovascular Disease | Admitting: Cardiovascular Disease

## 2015-06-13 DIAGNOSIS — I4891 Unspecified atrial fibrillation: Secondary | ICD-10-CM | POA: Diagnosis present

## 2015-06-13 DIAGNOSIS — R0602 Shortness of breath: Secondary | ICD-10-CM | POA: Insufficient documentation

## 2015-06-13 DIAGNOSIS — I440 Atrioventricular block, first degree: Secondary | ICD-10-CM | POA: Diagnosis not present

## 2015-06-13 DIAGNOSIS — I481 Persistent atrial fibrillation: Secondary | ICD-10-CM | POA: Insufficient documentation

## 2015-06-13 DIAGNOSIS — E119 Type 2 diabetes mellitus without complications: Secondary | ICD-10-CM | POA: Insufficient documentation

## 2015-06-13 DIAGNOSIS — I447 Left bundle-branch block, unspecified: Secondary | ICD-10-CM | POA: Diagnosis not present

## 2015-06-13 HISTORY — PX: ELECTROPHYSIOLOGIC STUDY: SHX172A

## 2015-06-13 LAB — GLUCOSE, CAPILLARY: GLUCOSE-CAPILLARY: 132 mg/dL — AB (ref 65–99)

## 2015-06-13 SURGERY — CARDIOVERSION (CATH LAB)
Anesthesia: General

## 2015-06-13 MED ORDER — SODIUM CHLORIDE 0.9 % IV SOLN: INTRAVENOUS | Status: DC

## 2015-06-13 MED ORDER — PROPOFOL 10 MG/ML IV BOLUS
INTRAVENOUS | Status: DC | PRN
  Administered 2015-06-13: 60 mg via INTRAVENOUS

## 2015-06-13 MED ORDER — SODIUM CHLORIDE 0.9 % IV SOLN
INTRAVENOUS | Status: DC | PRN
  Administered 2015-06-13: 07:00:00 via INTRAVENOUS

## 2015-06-13 MED ORDER — LIDOCAINE HCL (PF) 2 % IJ SOLN
INTRAMUSCULAR | Status: DC | PRN
  Administered 2015-06-13: 60 mg via INTRADERMAL

## 2015-06-13 NOTE — OR Nursing (Signed)
PRE Procedure: As per protocal pt care will be transferred to Anesthesia staff for monitoring during procedure. All VS & documentation in procedures per anesthesia staff. Care transferred back to [place] staff at [phone]

## 2015-06-13 NOTE — Transfer of Care (Signed)
Immediate Anesthesia Transfer of Care Note  Patient: Rhonda Hurley  Procedure(s) Performed: Procedure(s): Cardioversion (N/A)  Patient Location: PACU  Anesthesia Type:General  Level of Consciousness: awake, alert , oriented and patient cooperative  Airway & Oxygen Therapy: Patient Spontanous Breathing and Patient connected to nasal cannula oxygen  Post-op Assessment: Report given to RN and Post -op Vital signs reviewed and stable  Post vital signs: Reviewed and stable  Last Vitals:  Filed Vitals:   06/13/15 0744  BP: 138/68  Pulse: 70  Temp: 37.1 C  Resp: 16    Complications: No apparent anesthesia complications

## 2015-06-13 NOTE — Anesthesia Postprocedure Evaluation (Signed)
  Anesthesia Post-op Note  Patient: Rhonda Hurley  Procedure(s) Performed: Procedure(s): Cardioversion (N/A)  Anesthesia type:General  Patient location: PACU  Post pain: Pain level controlled  Post assessment: Post-op Vital signs reviewed, Patient's Cardiovascular Status Stable, Respiratory Function Stable, Patent Airway and No signs of Nausea or vomiting  Post vital signs: Reviewed and stable  Last Vitals:  Filed Vitals:   06/13/15 0800  BP: 139/79  Pulse:   Temp:   Resp:     Level of consciousness: awake, alert  and patient cooperative  Complications: No apparent anesthesia complications

## 2015-06-13 NOTE — Anesthesia Preprocedure Evaluation (Signed)
Anesthesia Evaluation  Patient identified by MRN, date of birth, ID band Patient awake    Reviewed: Allergy & Precautions, H&P , NPO status , Patient's Chart, lab work & pertinent test results  History of Anesthesia Complications Negative for: history of anesthetic complications  Airway Mallampati: III  TM Distance: >3 FB Neck ROM: limited    Dental  (+) Teeth Intact   Pulmonary shortness of breath,    Pulmonary exam normal breath sounds clear to auscultation       Cardiovascular hypertension, (-) Past MI Normal cardiovascular exam Rhythm:regular Rate:Normal     Neuro/Psych TIACVA, No Residual Symptoms negative psych ROS   GI/Hepatic negative GI ROS, Neg liver ROS,   Endo/Other  diabetes, Type 2Hypothyroidism   Renal/GU negative Renal ROS  negative genitourinary   Musculoskeletal   Abdominal   Peds  Hematology negative hematology ROS (+)   Anesthesia Other Findings Past Medical History:   Type II or unspecified type diabetes mellitus *              Mitral valve disorders                                         Comment:a. echo 03/2007: nl LVEF, mod MR, mmild TR, DD;               b. echo 10/2012: EF 50-55%, possible HK of               anterior & anteroseptal wall, mild to mod LVH,               nl RVSP   Essential hypertension, benign                               PAF (paroxysmal atrial fibrillation) (HCC)                     Comment:a. on Eliquis; b. CHADSVASc at least 7 (HTN,               DM, age x 2, stroke, female)   Other and unspecified hyperlipidemia                         Unspecified hypothyroidism                                   Derangement of posterior horn of medial menisc*              Acute cerebrovascular accident ([place])                           Comment:a. 10/2012: MRI concern for posible small acute               infarct along the left parietal cortex   TIA (transient ischemic attack)                  2014           Reproductive/Obstetrics negative OB ROS                             Anesthesia Physical Anesthesia Plan  ASA: III  Anesthesia Plan: General   Post-op Pain Management:    Induction:   Airway Management Planned:   Additional Equipment:   Intra-op Plan:   Post-operative Plan:   Informed Consent: I have reviewed the patients History and Physical, chart, labs and discussed the procedure including the risks, benefits and alternatives for the proposed anesthesia with the patient or authorized representative who has indicated his/her understanding and acceptance.   Dental Advisory Given  Plan Discussed with: Anesthesiologist, CRNA and Surgeon  Anesthesia Plan Comments:         Anesthesia Quick Evaluation

## 2015-06-13 NOTE — CV Procedure (Signed)
Cardioversion procedure note For atrial fibrillation, persistant.  Procedure Details:  Consent: Risks of procedure as well as the alternatives and risks of each were explained to the (patient/caregiver). Consent for procedure obtained.  Time Out: Verified patient identification, verified procedure, site/side was marked, verified correct patient position, special equipment/implants available, medications/allergies/relevent history reviewed, required imaging and test results available. Performed  Patient placed on cardiac monitor, pulse oximetry, supplemental oxygen as necessary.  Sedation given: propofol IV, Dr.  Gomez Cleverly pads placed anterior and posterior chest.   Cardioverted 1 time(s).  Cardioverted at 150 J. Synchronized biphasic Converted to NSR   Evaluation: Findings: Post procedure EKG shows: NSR Complications: None Patient did tolerate procedure well. Anesthesia: Dr. Sabino Snipes  Time Spent Directly with the Patient:  17 minutes   Esmond Plants, M.D., Ph.D.

## 2015-06-15 ENCOUNTER — Telehealth: Payer: Self-pay | Admitting: *Deleted

## 2015-06-15 NOTE — Telephone Encounter (Signed)
Pt calling stating she had a cath on Tuesday and stating she is having Still SOB,no app=itite, feeling weak, diarrhea. doesn't feel well at all Not sure of this is all because of the procedure or is this something else. Would like to know soon Please advise .

## 2015-06-15 NOTE — Telephone Encounter (Signed)
Pt is s/p cardioversion on 06/13/15. She reports that she feels terrible, has no appetite and has diarrhea. She has not checked her HR since the procedure.  Asked her to check, she reports HR now is 68. Asked pt to come over for EKG this afternoon, but she states that she has diarrhea and cannot leave her house.  Advised her to call her PCP to see about getting her stomach issue taken care of so that she does not go back into afib.  She verbalizes understanding and will call back if we can be of further assistance.

## 2015-06-16 ENCOUNTER — Emergency Department
Admission: EM | Admit: 2015-06-16 | Discharge: 2015-06-16 | Disposition: A | Discharge status: Home / Self Care | Payer: PPO | Attending: Emergency Medicine | Admitting: Emergency Medicine

## 2015-06-16 ENCOUNTER — Emergency Department: Payer: PPO

## 2015-06-16 ENCOUNTER — Encounter: Payer: Self-pay | Admitting: Intensive Care

## 2015-06-16 DIAGNOSIS — E119 Type 2 diabetes mellitus without complications: Secondary | ICD-10-CM | POA: Insufficient documentation

## 2015-06-16 DIAGNOSIS — I509 Heart failure, unspecified: Secondary | ICD-10-CM | POA: Insufficient documentation

## 2015-06-16 DIAGNOSIS — Z79899 Other long term (current) drug therapy: Secondary | ICD-10-CM | POA: Insufficient documentation

## 2015-06-16 DIAGNOSIS — R0602 Shortness of breath: Secondary | ICD-10-CM | POA: Diagnosis present

## 2015-06-16 DIAGNOSIS — I10 Essential (primary) hypertension: Secondary | ICD-10-CM | POA: Insufficient documentation

## 2015-06-16 DIAGNOSIS — Z7902 Long term (current) use of antithrombotics/antiplatelets: Secondary | ICD-10-CM | POA: Insufficient documentation

## 2015-06-16 LAB — BASIC METABOLIC PANEL
ANION GAP: 5 (ref 5–15)
BUN: 16 mg/dL (ref 6–20)
CALCIUM: 8.1 mg/dL — AB (ref 8.9–10.3)
CO2: 23 mmol/L (ref 22–32)
Chloride: 112 mmol/L — ABNORMAL HIGH (ref 101–111)
Creatinine, Ser: 0.69 mg/dL (ref 0.44–1.00)
GFR calc Af Amer: >60 mL/min (ref >60)
GFR calc non Af Amer: >60 mL/min (ref >60)
GLUCOSE: 125 mg/dL — AB (ref 65–99)
Potassium: 3.3 mmol/L — ABNORMAL LOW (ref 3.5–5.1)
Sodium: 140 mmol/L (ref 135–145)

## 2015-06-16 LAB — CBC
HEMATOCRIT: 35.6 % (ref 35.0–47.0)
HEMOGLOBIN: 11.3 g/dL — AB (ref 12.0–16.0)
MCH: 27.5 pg (ref 26.0–34.0)
MCHC: 31.6 g/dL — AB (ref 32.0–36.0)
MCV: 86.9 fL (ref 80.0–100.0)
Platelets: 271 10*3/uL (ref 150–440)
RBC: 4.1 MIL/uL (ref 3.80–5.20)
RDW: 16.4 % — ABNORMAL HIGH (ref 11.5–14.5)
WBC: 11.4 10*3/uL — ABNORMAL HIGH (ref 3.6–11.0)

## 2015-06-16 LAB — TROPONIN I: Troponin I: <0.03 ng/mL (ref <0.031)

## 2015-06-16 LAB — BRAIN NATRIURETIC PEPTIDE: B Natriuretic Peptide: 525 pg/mL — ABNORMAL HIGH (ref 0.0–100.0)

## 2015-06-16 MED ORDER — FUROSEMIDE 20 MG PO TABS: 20.0000 mg | ORAL_TABLET | Freq: Two times a day (BID) | ORAL | Status: DC

## 2015-06-16 MED ORDER — POTASSIUM CHLORIDE CRYS ER 20 MEQ PO TBCR
40.0000 meq | EXTENDED_RELEASE_TABLET | Freq: Once | ORAL | Status: AC
  Administered 2015-06-16: 40 meq via ORAL
  Filled 2015-06-16: qty 2

## 2015-06-16 MED ORDER — FUROSEMIDE 10 MG/ML IJ SOLN
20.0000 mg | Freq: Once | INTRAMUSCULAR | Status: AC
  Administered 2015-06-16: 20 mg via INTRAVENOUS
  Filled 2015-06-16: qty 4

## 2015-06-16 NOTE — Discharge Instructions (Signed)
Please follow up closely with Dr. Rockey Situ. He should be able to see on Monday or Tuesday. Return to the emergency room right away if you develop worsening of her shortness of breath, have severe chest pain, feel weak or dizzy, you have noticed to have low blood pressure or lightheadedness, or other new concerns arise.  Heart Failure Heart failure is a condition in which the heart has trouble pumping blood. This means your heart does not pump blood efficiently for your body to work well. In some cases of heart failure, fluid may back up into your lungs or you may have swelling (edema) in your lower legs. Heart failure is usually a long-term (chronic) condition. It is important for you to take good care of yourself and follow your health care provider's treatment plan. CAUSES  Some health conditions can cause heart failure. Those health conditions include:  High blood pressure (hypertension). Hypertension causes the heart muscle to work harder than normal. When pressure in the blood vessels is high, the heart needs to pump (contract) with more force in order to circulate blood throughout the body. High blood pressure eventually causes the heart to become stiff and weak.  Coronary artery disease (CAD). CAD is the buildup of cholesterol and fat (plaque) in the arteries of the heart. The blockage in the arteries deprives the heart muscle of oxygen and blood. This can cause chest pain and may lead to a heart attack. High blood pressure can also contribute to CAD.  Heart attack (myocardial infarction). A heart attack occurs when one or more arteries in the heart become blocked. The loss of oxygen damages the muscle tissue of the heart. When this happens, part of the heart muscle dies. The injured tissue does not contract as well and weakens the heart's ability to pump blood.  Abnormal heart valves. When the heart valves do not open and close properly, it can cause heart failure. This makes the heart muscle pump  harder to keep the blood flowing.  Heart muscle disease (cardiomyopathy or myocarditis). Heart muscle disease is damage to the heart muscle from a variety of causes. These can include drug or alcohol abuse, infections, or unknown reasons. These can increase the risk of heart failure.  Lung disease. Lung disease makes the heart work harder because the lungs do not work properly. This can cause a strain on the heart, leading it to fail.  Diabetes. Diabetes increases the risk of heart failure. High blood sugar contributes to high fat (lipid) levels in the blood. Diabetes can also cause slow damage to tiny blood vessels that carry important nutrients to the heart muscle. When the heart does not get enough oxygen and food, it can cause the heart to become weak and stiff. This leads to a heart that does not contract efficiently.  Other conditions can contribute to heart failure. These include abnormal heart rhythms, thyroid problems, and low blood counts (anemia). Certain unhealthy behaviors can increase the risk of heart failure, including:  Being overweight.  Smoking or chewing tobacco.  Eating foods high in fat and cholesterol.  Abusing illicit drugs or alcohol.  Lacking physical activity. SYMPTOMS  Heart failure symptoms may vary and can be hard to detect. Symptoms may include:  Shortness of breath with activity, such as climbing stairs.  Persistent cough.  Swelling of the feet, ankles, legs, or abdomen.  Unexplained weight gain.  Difficulty breathing when lying flat (orthopnea).  Waking from sleep because of the need to sit up and get more  air.  Rapid heartbeat.  Fatigue and loss of energy.  Feeling light-headed, dizzy, or close to fainting.  Loss of appetite.  Nausea.  Increased urination during the night (nocturia). DIAGNOSIS  A diagnosis of heart failure is based on your history, symptoms, physical examination, and diagnostic tests. Diagnostic tests for heart failure  may include:  Echocardiography.  Electrocardiography.  Chest X-ray.  Blood tests.  Exercise stress test.  Cardiac angiography.  Radionuclide scans. TREATMENT  Treatment is aimed at managing the symptoms of heart failure. Medicines, behavioral changes, or surgical intervention may be necessary to treat heart failure.  Medicines to help treat heart failure may include:  Angiotensin-converting enzyme (ACE) inhibitors. This type of medicine blocks the effects of a blood protein called angiotensin-converting enzyme. ACE inhibitors relax (dilate) the blood vessels and help lower blood pressure.  Angiotensin receptor blockers (ARBs). This type of medicine blocks the actions of a blood protein called angiotensin. Angiotensin receptor blockers dilate the blood vessels and help lower blood pressure.  Water pills (diuretics). Diuretics cause the kidneys to remove salt and water from the blood. The extra fluid is removed through urination. This loss of extra fluid lowers the volume of blood the heart pumps.  Beta blockers. These prevent the heart from beating too fast and improve heart muscle strength.  Digitalis. This increases the force of the heartbeat.  Healthy behavior changes include:  Obtaining and maintaining a healthy weight.  Stopping smoking or chewing tobacco.  Eating heart-healthy foods.  Limiting or avoiding alcohol.  Stopping illicit drug use.  Physical activity as directed by your health care provider.  Surgical treatment for heart failure may include:  A procedure to open blocked arteries, repair damaged heart valves, or remove damaged heart muscle tissue.  A pacemaker to improve heart muscle function and control certain abnormal heart rhythms.  An internal cardioverter defibrillator to treat certain serious abnormal heart rhythms.  A left ventricular assist device (LVAD) to assist the pumping ability of the heart. HOME CARE INSTRUCTIONS   Take medicines  only as directed by your health care provider. Medicines are important in reducing the workload of your heart, slowing the progression of heart failure, and improving your symptoms.  Do not stop taking your medicine unless directed by your health care provider.  Do not skip any dose of medicine.  Refill your prescriptions before you run out of medicine. Your medicines are needed every day.  Engage in moderate physical activity if directed by your health care provider. Moderate physical activity can benefit some people. The elderly and people with severe heart failure should consult with a health care provider for physical activity recommendations.  Eat heart-healthy foods. Food choices should be free of trans fat and low in saturated fat, cholesterol, and salt (sodium). Healthy choices include fresh or frozen fruits and vegetables, fish, lean meats, legumes, fat-free or low-fat dairy products, and whole grain or high fiber foods. Talk to a dietitian to learn more about heart-healthy foods.  Limit sodium if directed by your health care provider. Sodium restriction may reduce symptoms of heart failure in some people. Talk to a dietitian to learn more about heart-healthy seasonings.  Use healthy cooking methods. Healthy cooking methods include roasting, grilling, broiling, baking, poaching, steaming, or stir-frying. Talk to a dietitian to learn more about healthy cooking methods.  Limit fluids if directed by your health care provider. Fluid restriction may reduce symptoms of heart failure in some people.  Weigh yourself every day. Daily weights are important in  the early recognition of excess fluid. You should weigh yourself every morning after you urinate and before you eat breakfast. Wear the same amount of clothing each time you weigh yourself. Record your daily weight. Provide your health care provider with your weight record.  Monitor and record your blood pressure if directed by your health  care provider.  Check your pulse if directed by your health care provider.  Lose weight if directed by your health care provider. Weight loss may reduce symptoms of heart failure in some people.  Stop smoking or chewing tobacco. Nicotine makes your heart work harder by causing your blood vessels to constrict. Do not use nicotine gum or patches before talking to your health care provider.  Keep all follow-up visits as directed by your health care provider. This is important.  Limit alcohol intake to no more than 1 drink per day for nonpregnant women and 2 drinks per day for men. One drink equals 12 ounces of beer, 5 ounces of wine, or 1 ounces of hard liquor. Drinking more than that is harmful to your heart. Tell your health care provider if you drink alcohol several times a week. Talk with your health care provider about whether alcohol is safe for you. If your heart has already been damaged by alcohol or you have severe heart failure, drinking alcohol should be stopped completely.  Stop illicit drug use.  Stay up-to-date with immunizations. It is especially important to prevent respiratory infections through current pneumococcal and influenza immunizations.  Manage other health conditions such as hypertension, diabetes, thyroid disease, or abnormal heart rhythms as directed by your health care provider.  Learn to manage stress.  Plan rest periods when fatigued.  Learn strategies to manage high temperatures. If the weather is extremely hot:  Avoid vigorous physical activity.  Use air conditioning or fans or seek a cooler location.  Avoid caffeine and alcohol.  Wear loose-fitting, lightweight, and light-colored clothing.  Learn strategies to manage cold temperatures. If the weather is extremely cold:  Avoid vigorous physical activity.  Layer clothes.  Wear mittens or gloves, a hat, and a scarf when going outside.  Avoid alcohol.  Obtain ongoing education and support as  needed.  Participate in or seek rehabilitation as needed to maintain or improve independence and quality of life. SEEK MEDICAL CARE IF:   You have a rapid weight gain.  You have increasing shortness of breath that is unusual for you.  You are unable to participate in your usual physical activities.  You tire easily.  You cough more than normal, especially with physical activity.  You have any or more swelling in areas such as your hands, feet, ankles, or abdomen.  You are unable to sleep because it is hard to breathe.  You feel like your heart is beating fast (palpitations).  You become dizzy or light-headed upon standing up. SEEK IMMEDIATE MEDICAL CARE IF:   You have difficulty breathing.  There is a change in mental status such as decreased alertness or difficulty with concentration.  You have a pain or discomfort in your chest.  You have an episode of fainting (syncope). MAKE SURE YOU:   Understand these instructions.  Will watch your condition.  Will get help right away if you are not doing well or get worse.   This information is not intended to replace advice given to you by your health care provider. Make sure you discuss any questions you have with your health care provider.   Document Released:  08/19/2005 Document Revised: 01/03/2015 Document Reviewed: 09/18/2012 Elsevier Interactive Patient Education [employer].

## 2015-06-16 NOTE — ED Notes (Signed)
Patient arrived by EMS from [facility] independent living. Patient states "I started feeling lousy yesterday. I feel like it is hard to breathe when taking a breath and having chest tightness.

## 2015-06-16 NOTE — ED Provider Notes (Signed)
[facility] Emergency Department Provider Note REMINDER - THIS NOTE IS NOT A FINAL MEDICAL RECORD UNTIL IT IS SIGNED. UNTIL THEN, THE CONTENT BELOW MAY REFLECT INFORMATION FROM A DOCUMENTATION TEMPLATE, NOT THE ACTUAL PATIENT VISIT. ____________________________________________  Time seen: Approximately 9:18 AM  I have reviewed the triage vital signs and the nursing notes.   HISTORY  Chief Complaint Shortness of Breath    HPI Rhonda Hurley is a 81 y.o. female with a previous history of atrial fibrillation, diabetes, hypertension, and previous transient ischemic attack.  Patient presents today and notes that since having a cardioversion done on Tuesday feeling a slight feeling of breathlessness at times. She feels like she cannot have a deep breath. She notices cyst throughout the day, and is to feels a little short of breath at times. At the present time she feels okay, but when she got up this morning around 6:30 she reports that she started feeling very short of breath. She also reports that she's been feeling a slight tight feeling in her chest for the last 3 days. She reports no fever or chills, no productive cough. No swelling or legs.  She did feel briefly sweaty today.  The vast majority of her symptoms have improved at this time, she just feels a light feeling as though she cannot take a deep or full breath.  She denies "chest pain".  Past Medical History  Diagnosis Date  . Type II or unspecified type diabetes mellitus without mention of complication, not stated as uncontrolled   . Mitral valve disorders     a. echo 03/2007: nl LVEF, mod MR, mmild TR, DD; b. echo 10/2012: EF 50-55%, possible HK of anterior & anteroseptal wall, mild to mod LVH, nl RVSP  . Essential hypertension, benign   . PAF (paroxysmal atrial fibrillation) (HCC)     a. on Eliquis; b. CHADSVASc at least 7 (HTN, DM, age x 2, stroke, female)  . Other and unspecified hyperlipidemia    . Unspecified hypothyroidism   . 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 Acute cerebrovascular accident [facility])     a. 10/2012: MRI concern for posible small acute infarct along the left parietal cortex  . TIA (transient ischemic attack) 2014    Patient Active Problem List   Diagnosis Date Noted  . Persistent atrial fibrillation ([place])   . SOB (shortness of breath) 06/09/2015  . PAF (paroxysmal atrial fibrillation) ([place])   . Cough 08/14/2011  . Hyperlipidemia 02/05/2011  . HTN (hypertension) 02/05/2011  . Atrial fibrillation ([place]) 02/05/2011  . Diabetes mellitus ([place]) 02/05/2011  . Mitral valve insufficiency 02/05/2011    Past Surgical History  Procedure Laterality Date  . Splenectomy    . Breast lumpectomy  12/1996    left   . Radioactive ablation   1999    thyroid glan  . Cataract extraction  2000    left  . Cataract extraction  11/2010    right  . Electrophysiologic study N/A 06/13/2015    Procedure: Cardioversion;  Surgeon: Minna Merritts, MD;  Location: [facility] ORS;  Service: Cardiovascular;  Laterality: N/A;    Current Outpatient Rx  Name  Route  Sig  Dispense  Refill  . Cholecalciferol (VITAMIN D3) 1000 UNITS CAPS   Oral   Take 1,000 Units by mouth 2 (two) times daily.          Marland Kitchen ELIQUIS 5 MG TABS tablet      TAKE 1 TABLET (5 MG  TOTAL) BY MOUTH 2 (TWO) TIMES DAILY.   60 tablet   6   . flecainide (TAMBOCOR) 100 MG tablet   Oral   Take 1 tablet (100 mg total) by mouth 2 (two) times daily.   60 tablet   6   . levothyroxine (SYNTHROID, LEVOTHROID) 88 MCG tablet   Oral   Take 88 mcg by mouth daily before breakfast.         . metFORMIN (GLUMETZA) 500 MG (MOD) 24 hr tablet   Oral   Take 1,000 mg by mouth 2 (two) times daily.          . metoprolol 75 MG TABS   Oral   Take 75 mg by mouth 2 (two) times daily.   60 tablet   5   . ramipril (ALTACE) 10 MG capsule   Oral   Take 1 capsule (10 mg total) by mouth daily.   30 capsule   11    . simvastatin (ZOCOR) 20 MG tablet   Oral   Take 20 mg by mouth at bedtime.           . timolol (TIMOPTIC) 0.5 % ophthalmic solution      1 drop 2 (two) times daily.           . furosemide (LASIX) 20 MG tablet   Oral   Take 1 tablet (20 mg total) by mouth 2 (two) times daily.   8 tablet   0     Allergies Review of patient's allergies indicates no known allergies.  Family History  Problem Relation Age of Onset  . Family history unknown: Yes    Social History Social History  Substance Use Topics  . Smoking status: Never Smoker   . Smokeless tobacco: Never Used  . Alcohol Use: No    Review of Systems Constitutional: No fever/chills Eyes: No visual changes. ENT: No sore throat. Cardiovascular: Denies chest pain. Respiratory: See history of present illness  Gastrointestinal: No abdominal pain.  No nausea, no vomiting.  No diarrhea.  No constipation. Genitourinary: Negative for dysuria. Musculoskeletal: Negative for back pain. Skin: Negative for rash. Neurological: Negative for headaches, focal weakness or numbness.  10-point ROS otherwise negative.  Patient is notably on Eliquis, flecainide.  ____________________________________________   PHYSICAL EXAM:  VITAL SIGNS: ED Triage Vitals  Enc Vitals Group     BP 06/16/15 0824 180/80 mmHg     Pulse Rate 06/16/15 0824 71     Resp 06/16/15 0826 17     Temp 06/16/15 0824 97.2 F (36.2 C)     Temp Source 06/16/15 0824 Oral     SpO2 06/16/15 0824 95 %     Weight 06/16/15 0824 182 lb 12.2 oz (82.9 kg)     Height 06/16/15 0824 $RemoveBefor'5\' 5"'[id number]$  (1.651 m)     Head Cir --      Peak Flow --      Pain Score --      Pain Loc --      Pain Edu? --      Excl. in [place]? --    Constitutional: Alert and oriented. Well appearing and in no acute distress. Eyes: Conjunctivae are normal. PERRL. EOMI. Head: Atraumatic. Nose: No congestion/rhinnorhea. Mouth/Throat: Mucous membranes are moist.  Oropharynx non-erythematous. Neck: No  stridor.   Cardiovascular: Normal rate, regular rhythm. Grossly normal heart sounds.  Good peripheral circulation. No JVD. Respiratory: Normal respiratory effort.  No retractions. Lungs CTAB. Gastrointestinal: Soft and nontender. No distention. No abdominal bruits.  No CVA tenderness. Musculoskeletal: No lower extremity tenderness nor peripheral edema.  No joint effusions. Neurologic:  Normal speech and language. No gross focal neurologic deficits are appreciated. No gait instability. Skin:  Skin is warm, dry and intact. No rash noted. Psychiatric: Mood and affect are normal. Speech and behavior are normal.  ____________________________________________   LABS (all labs ordered are listed, but only abnormal results are displayed)  Labs Reviewed  BASIC METABOLIC PANEL - Abnormal; Notable for the following:    Potassium 3.3 (*)    Chloride 112 (*)    Glucose, Bld 125 (*)    Calcium 8.1 (*)    All other components within normal limits  CBC - Abnormal; Notable for the following:    WBC 11.4 (*)    Hemoglobin 11.3 (*)    MCHC 31.6 (*)    RDW 16.4 (*)    All other components within normal limits  BRAIN NATRIURETIC PEPTIDE - Abnormal; Notable for the following:    B Natriuretic Peptide 525.0 (*)    All other components within normal limits  TROPONIN I  TROPONIN I   ____________________________________________  EKG  Reviewed and interpreted by me Patient has a left bundle branch block, known to be pre-existing Normal sinus rhythm, Ventricular rate 70 QRS 170 QTC 510 No acute ischemic changes noted, no scarboaa criteria are met. ____________________________________________  RADIOLOGY  DG Chest 2 View (Final result) Result time: 06/16/15 08:58:09   Final result by Rad Results In Interface (06/16/15 08:58:09)   Narrative:   CLINICAL DATA: Shortness of breath since cardioversion on Tuesday.  EXAM: CHEST 2 VIEW  COMPARISON: 11/14/2012  FINDINGS: There is cardiomegaly  with vascular congestion. Increasing interstitial prominence likely reflects interstitial edema. Bibasilar opacities could reflect atelectasis or infiltrates. Small bilateral effusions. No acute bony abnormality. Degenerative changes in the shoulders.  IMPRESSION: Increasing interstitial prominence, likely interstitial edema.  Bibasilar atelectasis or infiltrates.  Small bilateral effusions.     ____________________________________________   PROCEDURES  Procedure(s) performed: None  Critical Care performed: No  ____________________________________________   INITIAL IMPRESSION / ASSESSMENT AND PLAN / ED COURSE  Pertinent labs & imaging results that were available during my care of the patient were reviewed by me and considered in my medical decision making (see chart for details).  Patient presents with mild dyspnea which has been ongoing since the time of her cardioversion. Differential diagnosis would certainly include mild congestive heart failure, slight volume overload, very unlikely pulmonary embolism given the patient is anticoagulated and her procedure was very short. She has no associated chest pain or pleurisy. She is also not have any chest pain, her EKG is unchanged from previous with left bundle-branch block.  I plan to discuss with the patient's primary cardiologist, we will obtain an x-ray, labs, troponin. Because of her history of left bundle branch block, I do think the patient likely will need to troponins. She does not exhibit any chest pain, but did demonstrate a brief episode of diaphoresis and breathlessness this morning.  ----------------------------------------- 9:24 AM on 06/16/2015 -----------------------------------------  Upon reviewing the patient's chest x-ray, it appears that the patient does have small bilateral effusions and in this setting with some mild dyspnea with hypertension I would be suspicious for mild congestive heart failure/pulmonary  edema. She is not hypoxic and is respiratory well. We'll await further labs and then discussed with her cardiologist.  ----------------------------------------- 11:39 AM on 06/16/2015 ----------------------------------------- Discussed with Dr.Gollan patient's primary cardiologist who advises pacing the patient on 20 mg Lasix  twice daily for the next 3 days. He recommends discharge, I'm agreeable as she is currently much improved showing no signs of distress speaking clearly. Appears consistent with some mild volume overload.  Patient reports she feels much improved, she is urinate 3 times. She reports shortness of breath is relieved. I will check a second troponin, if negative I'll plan to discharge her home. Careful care precautions and return precautions discussed with the patient, Dr.Gollan will call her to set up follow-up early this next week.  ----------------------------------------- 2:08 PM on 06/16/2015 -----------------------------------------  Patient reports she feels well. No distress. Speaking clearly. Normal oxygen saturation. Plan of care and place, discharge with Lasix and close follow-up with cardiology on Monday or Tuesday. Patient verbalizes understanding of return precautions. She appears much improved. ____________________________________________   FINAL CLINICAL IMPRESSION(S) / ED DIAGNOSES  Final diagnoses:  Congestive heart failure, unspecified congestive heart failure chronicity, unspecified congestive heart failure type (HCC)      Delman Kitten, MD 06/16/15 1408

## 2015-06-19 ENCOUNTER — Ambulatory Visit (INDEPENDENT_AMBULATORY_CARE_PROVIDER_SITE_OTHER): Payer: PPO | Admitting: Cardiovascular Disease

## 2015-06-19 ENCOUNTER — Encounter: Payer: Self-pay | Admitting: Cardiovascular Disease

## 2015-06-19 VITALS — BP 158/78 | HR 63 | Ht 65.0 in | Wt 163.2 lb

## 2015-06-19 DIAGNOSIS — I5033 Acute on chronic diastolic (congestive) heart failure: Secondary | ICD-10-CM

## 2015-06-19 DIAGNOSIS — E119 Type 2 diabetes mellitus without complications: Secondary | ICD-10-CM | POA: Diagnosis not present

## 2015-06-19 DIAGNOSIS — I481 Persistent atrial fibrillation: Secondary | ICD-10-CM | POA: Diagnosis not present

## 2015-06-19 DIAGNOSIS — I480 Paroxysmal atrial fibrillation: Secondary | ICD-10-CM

## 2015-06-19 DIAGNOSIS — R0602 Shortness of breath: Secondary | ICD-10-CM

## 2015-06-19 DIAGNOSIS — E785 Hyperlipidemia, unspecified: Secondary | ICD-10-CM

## 2015-06-19 DIAGNOSIS — I10 Essential (primary) hypertension: Secondary | ICD-10-CM

## 2015-06-19 DIAGNOSIS — I4819 Other persistent atrial fibrillation: Secondary | ICD-10-CM

## 2015-06-19 MED ORDER — FUROSEMIDE 20 MG PO TABS: 20.0000 mg | ORAL_TABLET | Freq: Two times a day (BID) | ORAL | Status: DC | PRN

## 2015-06-19 MED ORDER — POTASSIUM CHLORIDE CRYS ER 10 MEQ PO TBCR: 10.0000 meq | EXTENDED_RELEASE_TABLET | Freq: Every day | ORAL | Status: DC

## 2015-06-19 NOTE — Assessment & Plan Note (Signed)
She continues to be in atrial fibrillation following recent cardioversion Encouraged her to stay on flecainide 100 mg twice a day, metoprolol, anticoagulation

## 2015-06-19 NOTE — Assessment & Plan Note (Signed)
Encouraged her to stay on her simvastatin 

## 2015-06-19 NOTE — Assessment & Plan Note (Signed)
Shortness of breath following the cardioversion likely secondary to acute on chronic diastolic CHF. Improved symptoms on Lasix 20 g twice a day Recommended she start potassium, 10 mEq with every Lasix pill She will take 3 potassium today given low potassium 3 days ago, then take twice a day with Lasix twice a day Recommended she monitor her weight closely, decreasing Lasix down to daily dosing for weight drop Suspect she will be able to take Lasix daily with potassium on a regular basis, not twice a day

## 2015-06-19 NOTE — Progress Notes (Signed)
Patient ID: Rhonda Hurley, female    DOB: [birth year], 81 y.o.   MRN: [id number]  HPI Comments: Ms. Sarno is a very pleasant 81 year old woman with a history of paroxysmal atrial fibrillation, prior TIA in March 2014,  diabetes, hypertension, hypothyroidism, echocardiogram read by outside cardiologist suggesting moderate mitral valve regurgitation and diastolic dysfunction who presents for routine followup of her atrial fibrillation She had successful cardioversion 2 weeks ago Previous TIA March 2014  In follow-up, she reports that following the cardioversion she had shortness of breath. Symptoms became significant and 911 was called from home. In the emergency room, she was given Lasix with improvement of her symptoms. Was found to be in normal sinus rhythm. Discharged with Lasix 20 mg twice a day Weight is down 2-3 pounds She is feeling better, shortness of breath has improved She's not been taking any potassium. BMP reviewed with her showing low potassium last Friday, 3 days ago  EKG on today's visit shows normal sinus rhythm with rate 63 bpm, left bundle branch block  Other past medical history  having episode of atrial fibrillation 10 days ago.  TIA episode with hospital admission 11/14/2012. She recovered well. Head scans did not show large stroke. She was in atrial fibrillation at that time. She had refused anticoagulation in the past as she had bleeding of her gums. Now she takes anticoagulation. No recent bleeding Previous lab work Hemoglobin A1c 7.4. Total cholesterol 159, LDL 83  Previous  Echocardiogram done in the hospital showed ejection fraction 50-55%, possible hypokinesis of anterior and anteroseptal wall, mild to moderate LVH, normal right ventricular systolic pressures  Remote cardiac catheterization several years ago when she first developed atrial fibrillation with no significant disease by report Echocardiogram dated July 2008 showing normal systolic function,  moderate MR, mild TR, diastolic dysfunction.    No Known Allergies  Outpatient Encounter Prescriptions as of 06/19/2015  Medication Sig  . Cholecalciferol (VITAMIN D3) 1000 UNITS CAPS Take 1,000 Units by mouth as needed.   Marland Kitchen ELIQUIS 5 MG TABS tablet TAKE 1 TABLET (5 MG TOTAL) BY MOUTH 2 (TWO) TIMES DAILY.  . flecainide (TAMBOCOR) 100 MG tablet Take 1 tablet (100 mg total) by mouth 2 (two) times daily.  . furosemide (LASIX) 20 MG tablet Take 1 tablet (20 mg total) by mouth 2 (two) times daily as needed.  Marland Kitchen levothyroxine (SYNTHROID, LEVOTHROID) 88 MCG tablet Take 88 mcg by mouth daily before breakfast.  . metFORMIN (GLUMETZA) 500 MG (MOD) 24 hr tablet Take 1,000 mg by mouth 2 (two) times daily.   . metoprolol 75 MG TABS Take 75 mg by mouth 2 (two) times daily.  . ramipril (ALTACE) 10 MG capsule Take 1 capsule (10 mg total) by mouth daily.  . simvastatin (ZOCOR) 20 MG tablet Take 20 mg by mouth at bedtime.    . timolol (TIMOPTIC) 0.5 % ophthalmic solution 1 drop daily.   . [DISCONTINUED] furosemide (LASIX) 20 MG tablet Take 1 tablet (20 mg total) by mouth 2 (two) times daily.  . potassium chloride SA (K-DUR,KLOR-CON) 10 MEQ tablet Take 1 tablet (10 mEq total) by mouth daily.   No facility-administered encounter medications on file as of 06/19/2015.    Past Medical History  Diagnosis Date  . Type II or unspecified type diabetes mellitus without mention of complication, not stated as uncontrolled   . Mitral valve disorders     a. echo 03/2007: nl LVEF, mod MR, mmild TR, DD; b. echo 10/2012: EF 50-55%, possible HK  of anterior & anteroseptal wall, mild to mod LVH, nl RVSP  . Essential hypertension, benign   . PAF (paroxysmal atrial fibrillation) (HCC)     a. on Eliquis; b. CHADSVASc at least 7 (HTN, DM, age x 2, stroke, female)  . Other and unspecified hyperlipidemia   . Unspecified hypothyroidism   . 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 Acute cerebrovascular accident [facility])      a. 10/2012: MRI concern for posible small acute infarct along the left parietal cortex  . TIA (transient ischemic attack) 2014    Past Surgical History  Procedure Laterality Date  . Splenectomy    . Breast lumpectomy  12/1996    left   . Radioactive ablation   1999    thyroid glan  . Cataract extraction  2000    left  . Cataract extraction  11/2010    right  . Electrophysiologic study N/A 06/13/2015    Procedure: Cardioversion;  Surgeon: Minna Merritts, MD;  Location: [facility] ORS;  Service: Cardiovascular;  Laterality: N/A;    Social History  reports that she has never smoked. She has never used smokeless tobacco. She reports that she does not drink alcohol or use illicit drugs.  Family History Family history is unknown by patient.  Review of Systems  Constitutional: Negative.   Respiratory: Negative.   Cardiovascular: Negative.   Gastrointestinal: Negative.   Endocrine: Negative.   Musculoskeletal: Negative.   Neurological: Negative.   Hematological: Negative.   Psychiatric/Behavioral: Negative.   All other systems reviewed and are negative.   BP 158/78 mmHg  Pulse 63  Ht 5\' 5"  (1.651 m)  Wt 163 lb 4 oz (74.05 kg)  BMI 27.17 kg/m2  Physical Exam  Constitutional: She is oriented to person, place, and time. She appears well-developed and well-nourished.  HENT:  Head: Normocephalic.  Nose: Nose normal.  Mouth/Throat: Oropharynx is clear and moist.  Eyes: Conjunctivae are normal. Pupils are equal, round, and reactive to light.  Neck: Normal range of motion. Neck supple. No JVD present.  Cardiovascular: Normal rate, S1 normal, S2 normal and intact distal pulses.  An irregularly irregular rhythm present. Exam reveals no gallop and no friction rub.   Murmur heard.  Crescendo systolic murmur is present with a grade of 2/6  Pulmonary/Chest: Effort normal and breath sounds normal. No respiratory distress. She has no wheezes. She has no rales. She exhibits no tenderness.   Abdominal: Soft. Bowel sounds are normal. She exhibits no distension. There is no tenderness.  Musculoskeletal: Normal range of motion. She exhibits no edema or tenderness.  Lymphadenopathy:    She has no cervical adenopathy.  Neurological: She is alert and oriented to person, place, and time. Coordination normal.  Skin: Skin is warm and dry. No rash noted. No erythema.  Psychiatric: She has a normal mood and affect. Her behavior is normal. Judgment and thought content normal.    Assessment and Plan  Nursing note and vitals reviewed.

## 2015-06-19 NOTE — Patient Instructions (Addendum)
You are doing well.  Please take 3 potassium today Then take one potassium with every lasix pill  Please monitor your blood pressure at home Call if elevated  Please call us if you have new issues that need to be addressed before your next appt.  Your physician wants you to follow-up in: 6 months.  You will receive a reminder letter in the mail two months in advance. If you don't receive a letter, please call our office to schedule the follow-up appointment.

## 2015-06-19 NOTE — Assessment & Plan Note (Signed)
We have encouraged continued exercise, careful diet management in an effort to lose weight. 

## 2015-06-19 NOTE — Assessment & Plan Note (Signed)
Blood pressure is well controlled on today's visit. No changes made to the medications. 

## 2015-06-28 ENCOUNTER — Ambulatory Visit: Payer: PPO | Admitting: Cardiovascular Disease

## 2015-06-29 ENCOUNTER — Telehealth: Payer: Self-pay | Admitting: *Deleted

## 2015-06-29 NOTE — Telephone Encounter (Signed)
Pt c/o medication issue:  1. Name of Medication: Lasix   2. How are you currently taking this medication (dosage and times per day)? Takes it in the morning not sure of dose per pt.   3. Are you having a reaction (difficulty breathing--STAT)? No   4. What is your medication issue? A week ago she had a fall, more like a dizzy spell. She would like to know if this new medication that made her dizzy that time.

## 2015-06-29 NOTE — Telephone Encounter (Signed)
Spoke w/ pt.  She reports that she was out walking last week, she felt like her legs gave out and she slowly sat down.  She did not check her BP during or after event.  Pt denies SOB or edema. Pt reports that she has been taking lasix BID and that every time she urinates, she will drink a glass of water.  Discussed w/ her the purpose of lasix and advised her to drink when she is thirsty.  Advised her that at last ov, Dr. Rockey Situ documented that she may be able to take lasix once daily on a regular basis, and BID prn. She will try this, monitor her BP and call if her numbers become elevated.

## 2015-07-04 ENCOUNTER — Telehealth: Payer: Self-pay | Admitting: Cardiovascular Disease

## 2015-07-04 NOTE — Telephone Encounter (Signed)
Spoke w/ pt.  She reports that she fell recently and that her hip hurt She does not feel that her lasix is working, as she is not urinating as frequently as she was.  Pt does not weigh herself daily. Discussed w/ pt the reason for daily wts and to base the effectiveness of lasix on her wt, not the frequency of her urination. She states that her hip is very painful and she is sweating through her clothes. Pt has not had this evaluated. Advised her to call her PCP's office and see about possibly getting an order for an x-ray or to go to Urgent Care if they do not have an availability. She agrees w/ this and does have someone to drive her.  Asked her to call back if we can be of further assistance.

## 2015-07-04 NOTE — Telephone Encounter (Signed)
Patient called back with update.    She went to [facility] and had xray.  She says nothing serious found just irritated bone spur and they said use ice and tylenol.   Patient is ok with dc appt with Thurmond Butts next week and fu with [place] in April as suggested.   Patient is aware she can call to schedule if having problems in future and needs to be seen sooner.

## 2015-07-04 NOTE — Telephone Encounter (Signed)
Called patient to cancel old appt.  With Thurmond Butts next week (saw Rockey Situ recently ph and needs 6 m fu)  Patient wants to wait before she cancels as she says her Lasix and K+ are not working right and she may need to be seen for Fall injuries.    Suggested patient call PCP to check fall injuries .  Patient will try to get in with PCP and call us back .

## 2015-07-12 ENCOUNTER — Ambulatory Visit: Payer: PPO | Admitting: Physician Assistant

## 2015-08-22 ENCOUNTER — Emergency Department: Payer: PPO

## 2015-08-22 ENCOUNTER — Emergency Department
Admission: EM | Admit: 2015-08-22 | Discharge: 2015-08-22 | Disposition: A | Discharge status: Home / Self Care | Payer: PPO | Attending: Emergency Medicine | Admitting: Emergency Medicine

## 2015-08-22 ENCOUNTER — Encounter: Payer: Self-pay | Admitting: Emergency Medicine

## 2015-08-22 DIAGNOSIS — R3911 Hesitancy of micturition: Secondary | ICD-10-CM | POA: Insufficient documentation

## 2015-08-22 DIAGNOSIS — K5901 Slow transit constipation: Secondary | ICD-10-CM | POA: Insufficient documentation

## 2015-08-22 DIAGNOSIS — E119 Type 2 diabetes mellitus without complications: Secondary | ICD-10-CM | POA: Diagnosis not present

## 2015-08-22 DIAGNOSIS — R339 Retention of urine, unspecified: Secondary | ICD-10-CM | POA: Diagnosis not present

## 2015-08-22 DIAGNOSIS — I10 Essential (primary) hypertension: Secondary | ICD-10-CM | POA: Diagnosis not present

## 2015-08-22 DIAGNOSIS — K5900 Constipation, unspecified: Secondary | ICD-10-CM | POA: Diagnosis present

## 2015-08-22 LAB — URINALYSIS COMPLETE WITH MICROSCOPIC (ARMC ONLY)
Bacteria, UA: NONE SEEN
Bilirubin Urine: NEGATIVE
Glucose, UA: NEGATIVE mg/dL
Hgb urine dipstick: NEGATIVE
KETONES UR: NEGATIVE mg/dL
Leukocytes, UA: NEGATIVE
Nitrite: NEGATIVE
PH: 6 (ref 5.0–8.0)
PROTEIN: NEGATIVE mg/dL
Specific Gravity, Urine: 1.024 (ref 1.005–1.030)

## 2015-08-22 LAB — COMPREHENSIVE METABOLIC PANEL
ALT: 13 U/L — ABNORMAL LOW (ref 14–54)
AST: 18 U/L (ref 15–41)
Albumin: 4.2 g/dL (ref 3.5–5.0)
Alkaline Phosphatase: 101 U/L (ref 38–126)
Anion gap: 9 (ref 5–15)
BUN: 23 mg/dL — ABNORMAL HIGH (ref 6–20)
CO2: 27 mmol/L (ref 22–32)
Calcium: 9.4 mg/dL (ref 8.9–10.3)
Chloride: 105 mmol/L (ref 101–111)
Creatinine, Ser: 0.72 mg/dL (ref 0.44–1.00)
GFR calc Af Amer: >60 mL/min (ref >60)
GFR calc non Af Amer: >60 mL/min (ref >60)
Glucose, Bld: 143 mg/dL — ABNORMAL HIGH (ref 65–99)
Potassium: 3.9 mmol/L (ref 3.5–5.1)
Sodium: 141 mmol/L (ref 135–145)
Total Bilirubin: 1.2 mg/dL (ref 0.3–1.2)
Total Protein: 7.1 g/dL (ref 6.5–8.1)

## 2015-08-22 LAB — LIPASE, BLOOD: Lipase: 18 U/L (ref 11–51)

## 2015-08-22 LAB — CBC
HCT: 39.5 % (ref 35.0–47.0)
HEMOGLOBIN: 12.5 g/dL (ref 12.0–16.0)
MCH: 28.1 pg (ref 26.0–34.0)
MCHC: 31.7 g/dL — AB (ref 32.0–36.0)
MCV: 88.5 fL (ref 80.0–100.0)
Platelets: 306 10*3/uL (ref 150–440)
RBC: 4.47 MIL/uL (ref 3.80–5.20)
RDW: 17.3 % — ABNORMAL HIGH (ref 11.5–14.5)
WBC: 11 10*3/uL (ref 3.6–11.0)

## 2015-08-22 LAB — BRAIN NATRIURETIC PEPTIDE: B NATRIURETIC PEPTIDE 5: 197 pg/mL — AB (ref 0.0–100.0)

## 2015-08-22 LAB — TROPONIN I

## 2015-08-22 MED ORDER — POLYETHYLENE GLYCOL 3350 17 G PO PACK: 17.0000 g | PACK | Freq: Every day | ORAL | Status: DC

## 2015-08-22 MED ORDER — CIPROFLOXACIN HCL 250 MG PO TABS: 250.0000 mg | ORAL_TABLET | Freq: Two times a day (BID) | ORAL | Status: AC

## 2015-08-22 NOTE — Discharge Instructions (Signed)
Constipation, Adult Constipation is when a person has fewer than three bowel movements a week, has difficulty having a bowel movement, or has stools that are dry, hard, or larger than normal. As people grow older, constipation is more common. A low-fiber diet, not taking in enough fluids, and taking certain medicines may make constipation worse.  CAUSES   Certain medicines, such as antidepressants, pain medicine, iron supplements, antacids, and water pills.   Certain diseases, such as diabetes, irritable bowel syndrome (IBS), thyroid disease, or depression.   Not drinking enough water.   Not eating enough fiber-rich foods.   Stress or travel.   Lack of physical activity or exercise.   Ignoring the urge to have a bowel movement.   Using laxatives too much.  SIGNS AND SYMPTOMS   Having fewer than three bowel movements a week.   Straining to have a bowel movement.   Having stools that are hard, dry, or larger than normal.   Feeling full or bloated.   Pain in the lower abdomen.   Not feeling relief after having a bowel movement.  DIAGNOSIS  Your health care provider will take a medical history and perform a physical exam. Further testing may be done for severe constipation. Some tests may include:  A barium enema X-ray to examine your rectum, colon, and, sometimes, your small intestine.   A sigmoidoscopy to examine your lower colon.   A colonoscopy to examine your entire colon. TREATMENT  Treatment will depend on the severity of your constipation and what is causing it. Some dietary treatments include drinking more fluids and eating more fiber-rich foods. Lifestyle treatments may include regular exercise. If these diet and lifestyle recommendations do not help, your health care provider may recommend taking over-the-counter laxative medicines to help you have bowel movements. Prescription medicines may be prescribed if over-the-counter medicines do not work.    HOME CARE INSTRUCTIONS   Eat foods that have a lot of fiber, such as fruits, vegetables, whole grains, and beans.  Limit foods high in fat and processed sugars, such as french fries, hamburgers, cookies, candies, and soda.   A fiber supplement may be added to your diet if you cannot get enough fiber from foods.   Drink enough fluids to keep your urine clear or pale yellow.   Exercise regularly or as directed by your health care provider.   Go to the restroom when you have the urge to go. Do not hold it.   Only take over-the-counter or prescription medicines as directed by your health care provider. Do not take other medicines for constipation without talking to your health care provider first.  [place] IF:   You have bright red blood in your stool.   Your constipation lasts for more than 4 days or gets worse.   You have abdominal or rectal pain.   You have thin, pencil-like stools.   You have unexplained weight loss. MAKE SURE YOU:   Understand these instructions.  Will watch your condition.  Will get help right away if you are not doing well or get worse.   This information is not intended to replace advice given to you by your health care provider. Make sure you discuss any questions you have with your health care provider.   Document Released: 05/17/2004 Document Revised: 09/09/2014 Document Reviewed: 05/31/2013 Elsevier Interactive Patient Education 2016 [place].  Acute Urinary Retention, Female Acute urinary retention is the temporary inability to urinate. This is an uncommon problem in  women. It can be caused by:  Infection.  A side effect of a medicine.  A problem in a nearby organ that presses or squeezes on the bladder or the urethra (the tube that drains the bladder).  Psychological problems.   Surgery on your bladder, urethra, or pelvic organs that causes obstruction to the outflow of urine from your bladder. HOME  CARE INSTRUCTIONS  If you are sent home with a Foley catheter and a drainage system, you will need to discuss the best course of action with your health care provider. While the catheter is in, maintain a good intake of fluids. Keep the drainage bag emptied and lower than your catheter. This is so that contaminated urine will not flow back into your bladder, which could lead to a urinary tract infection. There are two main types of drainage bags. One is a large bag that usually is used at night. It has a good capacity that will allow you to sleep through the night without having to empty it. The second type is called a leg bag. It has a smaller capacity so it needs to be emptied more frequently. However, the main advantage is that it can be attached by a leg strap and goes underneath your clothing, allowing you the freedom to move about or leave your home. Only take over-the-counter or prescription medicines for pain, discomfort, or fever as directed by your health care provider.  SEEK MEDICAL CARE IF:  You develop a low-grade fever.  You experience spasms or leakage of urine with the spasms. SEEK IMMEDIATE MEDICAL CARE IF:   You develop chills or fever.  Your catheter stops draining urine.  Your catheter falls out.  You start to develop increased bleeding that does not respond to rest and increased fluid intake. MAKE SURE YOU:  Understand these instructions.  Will watch your condition.  Will get help right away if you are not doing well or get worse.   This information is not intended to replace advice given to you by your health care provider. Make sure you discuss any questions you have with your health care provider.   Document Released: 08/18/2006 Document Revised: 01/03/2015 Document Reviewed: 01/28/2013 Elsevier Interactive Patient Education [employer].

## 2015-08-22 NOTE — ED Notes (Signed)
States she developed some problems with urination and having a bowel movement for them past 1 week unable to void for the past 2 days denies any nausea or vomiting

## 2015-08-22 NOTE — ED Notes (Signed)
Pt to ed with c/o constipation x 1 week,  Reports unable to void x 2 days.  Pt states she also feels sob when she tries to take a deep breath.

## 2015-08-22 NOTE — ED Provider Notes (Signed)
[facility] Emergency Department Provider Note     Time seen: ----------------------------------------- 11:15 AM on 08/22/2015 -----------------------------------------    I have reviewed the triage vital signs and the nursing notes.   HISTORY  Chief Complaint Constipation    HPI Rhonda Hurley is a 81 y.o. female who presents ER for trouble making urine and feeling constipated for the last week. She's been unable to void for 2 days denies any fevers, chills, chest pain, shortness of breath, nausea vomiting or diarrhea. Patient states she's never had trouble with urinary retention before.   Past Medical History  Diagnosis Date  . Type II or unspecified type diabetes mellitus without mention of complication, not stated as uncontrolled   . Mitral valve disorders     a. echo 03/2007: nl LVEF, mod MR, mmild TR, DD; b. echo 10/2012: EF 50-55%, possible HK of anterior & anteroseptal wall, mild to mod LVH, nl RVSP  . Essential hypertension, benign   . PAF (paroxysmal atrial fibrillation) (HCC)     a. on Eliquis; b. CHADSVASc at least 7 (HTN, DM, age x 2, stroke, female)  . Other and unspecified hyperlipidemia   . Unspecified hypothyroidism   . 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 Acute cerebrovascular accident [facility])     a. 10/2012: MRI concern for posible small acute infarct along the left parietal cortex  . TIA (transient ischemic attack) 2014    Patient Active Problem List   Diagnosis Date Noted  . Acute on chronic diastolic CHF (congestive heart failure) ([place]) 06/19/2015  . Persistent atrial fibrillation ([place])   . SOB (shortness of breath) 06/09/2015  . PAF (paroxysmal atrial fibrillation) ([place])   . Cough 08/14/2011  . Hyperlipidemia 02/05/2011  . HTN (hypertension) 02/05/2011  . Atrial fibrillation ([place]) 02/05/2011  . Diabetes mellitus ([place]) 02/05/2011    Past Surgical History  Procedure Laterality Date  . Splenectomy    .  Breast lumpectomy  12/1996    left   . Radioactive ablation   1999    thyroid glan  . Cataract extraction  2000    left  . Cataract extraction  11/2010    right  . Electrophysiologic study N/A 06/13/2015    Procedure: Cardioversion;  Surgeon: Minna Merritts, MD;  Location: [facility] ORS;  Service: Cardiovascular;  Laterality: N/A;    Allergies Review of patient's allergies indicates no known allergies.  Social History Social History  Substance Use Topics  . Smoking status: Never Smoker   . Smokeless tobacco: Never Used  . Alcohol Use: No    Review of Systems Constitutional: Negative for fever. Eyes: Negative for visual changes. ENT: Negative for sore throat. Cardiovascular: Negative for chest pain. Respiratory: Negative for shortness of breath. Gastrointestinal: Negative for abdominal pain, vomiting and diarrhea, positive for constipation Genitourinary: Positive urinary hesitancy  Musculoskeletal: Negative for back pain. Skin: Negative for rash. Neurological: Negative for headaches, focal weakness or numbness.  10-point ROS otherwise negative.  ____________________________________________   PHYSICAL EXAM:  VITAL SIGNS: ED Triage Vitals  Enc Vitals Group     BP 08/22/15 1053 220/84 mmHg     Pulse Rate 08/22/15 1053 63     Resp 08/22/15 1053 20     Temp 08/22/15 1053 98.4 F (36.9 C)     Temp Source 08/22/15 1053 Oral     SpO2 08/22/15 1053 97 %     Weight 08/22/15 1053 163 lb (73.936 kg)     Height 08/22/15 1053  5\' 5"  (1.651 m)     Head Cir --      Peak Flow --      Pain Score 08/22/15 1054 0     Pain Loc --      Pain Edu? --      Excl. in [place]? --     Constitutional: Alert and oriented. Well appearing and in no distress. Eyes: Conjunctivae are normal. PERRL. Normal extraocular movements. ENT   Head: Normocephalic and atraumatic.   Nose: No congestion/rhinnorhea.   Mouth/Throat: Mucous membranes are moist.   Neck: No stridor. Cardiovascular:  Normal rate, regular rhythm. Normal and symmetric distal pulses are present in all extremities. No murmurs, rubs, or gallops. Respiratory: Normal respiratory effort without tachypnea nor retractions. Breath sounds are clear and equal bilaterally. No wheezes/rales/rhonchi. Gastrointestinal: Soft and nontender. No distention. No abdominal bruits.  Musculoskeletal: Nontender with normal range of motion in all extremities. No joint effusions.  No lower extremity tenderness nor edema. Neurologic:  Normal speech and language. No gross focal neurologic deficits are appreciated. Speech is normal. No gait instability. Skin:  Skin is warm, dry and intact. No rash noted. Psychiatric: Mood and affect are normal. Speech and behavior are normal. Patient exhibits appropriate insight and judgment. ____________________________________________  EKG: Interpreted by me. Sinus bradycardia with rate of 54 bpm, left bundle branch block, left anterior fascicular block. Left axis deviation.  ____________________________________________  ED COURSE:  Pertinent labs & imaging results that were available during my care of the patient were reviewed by me and considered in my medical decision making (see chart for details). Patient is in no acute distress, will check basic labs and imaging. ____________________________________________    LABS (pertinent positives/negatives)  Labs Reviewed  COMPREHENSIVE METABOLIC PANEL - Abnormal; Notable for the following:    Glucose, Bld 143 (*)    BUN 23 (*)    ALT 13 (*)    All other components within normal limits  CBC - Abnormal; Notable for the following:    MCHC 31.7 (*)    RDW 17.3 (*)    All other components within normal limits  URINALYSIS COMPLETEWITH MICROSCOPIC ([facility] ONLY) - Abnormal; Notable for the following:    Color, Urine YELLOW (*)    APPearance CLEAR (*)    Squamous Epithelial / LPF 0-5 (*)    All other components within normal limits  BRAIN NATRIURETIC  PEPTIDE - Abnormal; Notable for the following:    B Natriuretic Peptide 197.0 (*)    All other components within normal limits  LIPASE, BLOOD  TROPONIN I    RADIOLOGY Images were viewed by me  IMPRESSION: 1. Cardiomegaly and lung hyperexpansion without acute cardiopulmonary disease. 2. Moderate to large colonic stool burden without evidence of enteric obstruction. 3. Improved aeration along suggests resolved edema and/or infiltrate. 4. Suspected calcified uterine leiomyoma.  ____________________________________________  FINAL ASSESSMENT AND PLAN  Constipation, urinary retention  Plan: Patient with labs and imaging as dictated above. Patient be discharged with MiraLAX and encouraged to have close follow-up with urology for reevaluation. She did have a Foley catheter placed for urinary retention, is stable for discharge.   Earleen Newport, MD   Earleen Newport, MD 08/22/15 [phone]

## 2015-08-29 ENCOUNTER — Ambulatory Visit: Payer: Self-pay | Admitting: Urology

## 2015-08-31 ENCOUNTER — Ambulatory Visit (INDEPENDENT_AMBULATORY_CARE_PROVIDER_SITE_OTHER): Payer: PPO | Admitting: Urology

## 2015-08-31 ENCOUNTER — Encounter: Payer: Self-pay | Admitting: Urology

## 2015-08-31 VITALS — BP 175/74 | HR 64 | Ht 65.0 in | Wt 157.1 lb

## 2015-08-31 DIAGNOSIS — R339 Retention of urine, unspecified: Secondary | ICD-10-CM

## 2015-08-31 NOTE — Progress Notes (Signed)
08/31/2015 10:16 AM   Rhonda Hurley 05-11-1927 [id number]  Referring provider: Juluis Pitch, MD [place], [place] [address]  Chief Complaint  Patient presents with  . New Patient (Initial Visit)    urinary retention, foley cath x 9 days     HPI: Rhonda Hurley is a 81yo seen in consultation for urinary retention. She had a fall in Oct 2016 and since then she has noted progressive difficulty emptying her bladder. She was seen in the ER 1 week ago for urinary retention and constipation. She was prescribed miralax and is now having a BM daily.  She denies any SUI or urge incontinence. She does have urgency.     PMH: Past Medical History  Diagnosis Date  . Type II or unspecified type diabetes mellitus without mention of complication, not stated as uncontrolled   . Mitral valve disorders     a. echo 03/2007: nl LVEF, mod MR, mmild TR, DD; b. echo 10/2012: EF 50-55%, possible HK of anterior & anteroseptal wall, mild to mod LVH, nl RVSP  . Essential hypertension, benign   . PAF (paroxysmal atrial fibrillation) (HCC)     a. on Eliquis; b. CHADSVASc at least 7 (HTN, DM, age x 2, stroke, female)  . Other and unspecified hyperlipidemia   . Unspecified hypothyroidism   . 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 Acute cerebrovascular accident [facility])     a. 10/2012: MRI concern for posible small acute infarct along the left parietal cortex  . TIA (transient ischemic attack) 2014  . Acute on chronic diastolic CHF (congestive heart failure) ([place]) 06/19/2015  . Atrial fibrillation ([place]) 02/05/2011  . BP (high blood pressure) 04/08/2014  . HTN (hypertension) 02/05/2011    Surgical History: Past Surgical History  Procedure Laterality Date  . Splenectomy    . Breast lumpectomy  12/1996    left   . Radioactive ablation   1999    thyroid glan  . Cataract extraction  2000    left  . Cataract extraction  11/2010   right  . Electrophysiologic study N/A 06/13/2015    Procedure: Cardioversion;  Surgeon: Minna Merritts, MD;  Location: [facility] ORS;  Service: Cardiovascular;  Laterality: N/A;    Home Medications:    Medication List       This list is accurate as of: 08/31/15 10:16 AM.  Always use your most recent med list.               ciprofloxacin 250 MG tablet  Commonly known as:  CIPRO  Take 1 tablet (250 mg total) by mouth 2 (two) times daily.     ELIQUIS 5 MG Tabs tablet  Generic drug:  apixaban  TAKE 1 TABLET (5 MG TOTAL) BY MOUTH 2 (TWO) TIMES DAILY.     flecainide 100 MG tablet  Commonly known as:  TAMBOCOR  Take 1 tablet (100 mg total) by mouth 2 (two) times daily.     furosemide 20 MG tablet  Commonly known as:  LASIX  Take 1 tablet (20 mg total) by mouth 2 (two) times daily as needed.     levothyroxine 88 MCG tablet  Commonly known as:  SYNTHROID, LEVOTHROID  Take 88 mcg by mouth daily before breakfast.     metFORMIN 500 MG tablet  Commonly known as:  GLUCOPHAGE  Take 1,000 mg by mouth 2 (two) times daily.     metoprolol tartrate  25 MG tablet  Commonly known as:  LOPRESSOR  Take 25 mg by mouth 2 (two) times daily.     polyethylene glycol packet  Commonly known as:  MIRALAX / GLYCOLAX  Take 17 g by mouth daily.     potassium chloride 10 MEQ tablet  Commonly known as:  K-DUR,KLOR-CON  Take 1 tablet (10 mEq total) by mouth daily.     ramipril 10 MG capsule  Commonly known as:  ALTACE  Take 1 capsule (10 mg total) by mouth daily.     simvastatin 20 MG tablet  Commonly known as:  ZOCOR  Take 20 mg by mouth at bedtime.     timolol 0.5 % ophthalmic solution  Commonly known as:  TIMOPTIC  1 drop daily.     vitamin B-12 1000 MCG tablet  Commonly known as:  CYANOCOBALAMIN  Take 1,000 mcg by mouth daily.     Vitamin D3 1000 units Caps  Take 1,000 Units by mouth daily.        Allergies: No Known Allergies  Family History: Family History  Problem Relation  Age of Onset  . Bladder Cancer Neg Hx   . Kidney cancer Neg Hx     Social History:  reports that she has never smoked. She has never used smokeless tobacco. She reports that she does not drink alcohol or use illicit drugs.  ROS: UROLOGY Frequent Urination?: No Hard to postpone urination?: No Burning/pain with urination?: Yes Get up at night to urinate?: Yes Leakage of urine?: No Urine stream starts and stops?: No Trouble starting stream?: Yes Do you have to strain to urinate?: Yes Blood in urine?: No Urinary tract infection?: Yes Sexually transmitted disease?: No Injury to kidneys or bladder?: No Painful intercourse?: No Weak stream?: No Currently pregnant?: No Vaginal bleeding?: No Last menstrual period?: N/A  Gastrointestinal Nausea?: No Vomiting?: No Indigestion/heartburn?: No Diarrhea?: No Constipation?: No  Constitutional Fever: No Night sweats?: No Weight loss?: No Fatigue?: No  Skin Skin rash/lesions?: No Itching?: No  Eyes Blurred vision?: No Double vision?: No  Ears/Nose/Throat Sore throat?: No Sinus problems?: No  Hematologic/Lymphatic Swollen glands?: No Easy bruising?: No  Cardiovascular Leg swelling?: No Chest pain?: No  Respiratory Cough?: No Shortness of breath?: Yes  Endocrine Excessive thirst?: No  Musculoskeletal Back pain?: No Joint pain?: No  Neurological Headaches?: No Dizziness?: No  Psychologic Depression?: No  Physical Exam: BP 175/74 mmHg  Pulse 64  Ht 5\' 5"  (1.651 m)  Wt 71.26 kg (157 lb 1.6 oz)  BMI 26.14 kg/m2  Constitutional:  Alert and oriented, No acute distress. HEENT: [place] AT, moist mucus membranes.  Trachea midline, no masses. Cardiovascular: No clubbing, cyanosis, or edema. Respiratory: Normal respiratory effort, no increased work of breathing. GI: Abdomen is soft, nontender, nondistended, no abdominal masses small umbilical hernia GU: No CVA tenderness.  Skin: No rashes, bruises or suspicious  lesions. Lymph: No cervical or inguinal adenopathy. Neurologic: Grossly intact, no focal deficits, moving all 4 extremities. Psychiatric: Normal mood and affect.  Laboratory Data: Lab Results  Component Value Date   WBC 11.0 08/22/2015   HGB 12.5 08/22/2015   HCT 39.5 08/22/2015   MCV 88.5 08/22/2015   PLT 306 08/22/2015    Lab Results  Component Value Date   CREATININE 0.72 08/22/2015    No results found for: PSA  No results found for: TESTOSTERONE  Lab Results  Component Value Date   HGBA1C 7.7* 11/14/2012    Urinalysis    Component Value Date/Time  COLORURINE YELLOW* 08/22/2015 1230   COLORURINE Straw 11/14/2012 1046   APPEARANCEUR CLEAR* 08/22/2015 1230   APPEARANCEUR Clear 11/14/2012 1046   LABSPEC 1.024 08/22/2015 1230   LABSPEC 1.003 11/14/2012 1046   PHURINE 6.0 08/22/2015 1230   PHURINE 7.0 11/14/2012 1046   GLUCOSEU NEGATIVE 08/22/2015 1230   GLUCOSEU Negative 11/14/2012 1046   HGBUR NEGATIVE 08/22/2015 1230   HGBUR Negative 11/14/2012 [place] 08/22/2015 1230   BILIRUBINUR Negative 11/14/2012 [place] 08/22/2015 1230   KETONESUR Negative 11/14/2012 [place] 08/22/2015 1230   PROTEINUR Negative 11/14/2012 1046   NITRITE NEGATIVE 08/22/2015 1230   NITRITE Negative 11/14/2012 1046   LEUKOCYTESUR NEGATIVE 08/22/2015 1230   LEUKOCYTESUR 2+ 11/14/2012 1046    Pertinent Imaging: none  Assessment & Plan:    1. Urinary retention -voiding trial today. Pt able to empty bladder. She was instructed to drink plenty of fluids and timed voiding RTC 1 month  2. Constipation -continue miralax 17g daily  No Follow-up on file.  Cleon Gustin, [facility] [address] [place], [place] [address] [phone]

## 2015-09-12 ENCOUNTER — Other Ambulatory Visit: Payer: Self-pay | Admitting: Cardiovascular Disease

## 2015-09-29 ENCOUNTER — Ambulatory Visit (INDEPENDENT_AMBULATORY_CARE_PROVIDER_SITE_OTHER): Payer: PPO | Admitting: Urology

## 2015-09-29 ENCOUNTER — Encounter: Payer: Self-pay | Admitting: Urology

## 2015-09-29 VITALS — Ht 64.0 in | Wt 154.1 lb

## 2015-09-29 DIAGNOSIS — K5900 Constipation, unspecified: Secondary | ICD-10-CM

## 2015-09-29 DIAGNOSIS — R339 Retention of urine, unspecified: Secondary | ICD-10-CM | POA: Diagnosis not present

## 2015-09-29 DIAGNOSIS — Z87448 Personal history of other diseases of urinary system: Secondary | ICD-10-CM | POA: Diagnosis not present

## 2015-09-29 DIAGNOSIS — R34 Anuria and oliguria: Secondary | ICD-10-CM | POA: Diagnosis not present

## 2015-09-29 DIAGNOSIS — Z87898 Personal history of other specified conditions: Secondary | ICD-10-CM

## 2015-09-29 LAB — BLADDER SCAN AMB NON-IMAGING

## 2015-09-29 MED ORDER — DOCUSATE SODIUM 100 MG PO CAPS: 100.0000 mg | ORAL_CAPSULE | Freq: Two times a day (BID) | ORAL | Status: DC | PRN

## 2015-09-29 MED ORDER — POLYETHYLENE GLYCOL 3350 17 G PO PACK: 17.0000 g | PACK | Freq: Every day | ORAL | Status: DC

## 2015-09-29 NOTE — Progress Notes (Signed)
10:55 AM  09/29/2015   Rhonda Hurley [birth year] [id number]  Referring provider: Juluis Pitch, MD [place], [place] [address]  Chief Complaint  Patient presents with  . Urinary Retention    48month    HPI: 82 year old female who presents today for reevaluation of possible urinary retention. She has a history of urinary retention associated with severe constipation approximately 1 month ago. She called our office this morning after concerned that she wasn't voiding enough.  She last voided yesterday night at 11 PM. She has not voided since today. She is not drinking hardly any water or fluids due to concerns for developing urinary retention.  She is not uncomfortable and does not have the sensation of needing to urinate.  Bladder scan today shows minimal fluid in her bladder.  She stopped taking her MiraLAX and has not had a bowel movement in over 3 days.   PMH: Past Medical History  Diagnosis Date  . Type II or unspecified type diabetes mellitus without mention of complication, not stated as uncontrolled   . Mitral valve disorders     a. echo 03/2007: nl LVEF, mod MR, mmild TR, DD; b. echo 10/2012: EF 50-55%, possible HK of anterior & anteroseptal wall, mild to mod LVH, nl RVSP  . Essential hypertension, benign   . PAF (paroxysmal atrial fibrillation) (HCC)     a. on Eliquis; b. CHADSVASc at least 7 (HTN, DM, age x 2, stroke, female)  . Other and unspecified hyperlipidemia   . Unspecified hypothyroidism   . 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 Acute cerebrovascular accident [facility])     a. 10/2012: MRI concern for posible small acute infarct along the left parietal cortex  . TIA (transient ischemic attack) 2014  . Acute on chronic diastolic CHF (congestive heart failure) ([place]) 06/19/2015  . Atrial fibrillation ([place]) 02/05/2011  . BP (high blood pressure) 04/08/2014  . HTN (hypertension)  02/05/2011    Surgical History: Past Surgical History  Procedure Laterality Date  . Splenectomy    . Breast lumpectomy  12/1996    left   . Radioactive ablation   1999    thyroid glan  . Cataract extraction  2000    left  . Cataract extraction  11/2010    right  . Electrophysiologic study N/A 06/13/2015    Procedure: Cardioversion;  Surgeon: Minna Merritts, MD;  Location: [facility] ORS;  Service: Cardiovascular;  Laterality: N/A;    Home Medications:    Medication List       This list is accurate as of: 09/29/15 10:55 AM.  Always use your most recent med list.               ELIQUIS 5 MG Tabs tablet  Generic drug:  apixaban  TAKE 1 TABLET (5 MG TOTAL) BY MOUTH 2 (TWO) TIMES DAILY.     flecainide 100 MG tablet  Commonly known as:  TAMBOCOR  Take 1 tablet (100 mg total) by mouth 2 (two) times daily.     furosemide 20 MG tablet  Commonly known as:  LASIX  Take 1 tablet (20 mg total) by mouth 2 (two) times daily as needed.     levothyroxine 88 MCG tablet  Commonly known as:  SYNTHROID, LEVOTHROID  Take 88 mcg by mouth daily before breakfast.     metFORMIN 500 MG tablet  Commonly known as:  GLUCOPHAGE  Take 1,000 mg  by mouth 2 (two) times daily.     metoprolol tartrate 25 MG tablet  Commonly known as:  LOPRESSOR  Take 25 mg by mouth 2 (two) times daily.     polyethylene glycol packet  Commonly known as:  MIRALAX / GLYCOLAX  Take 17 g by mouth daily.     potassium chloride 10 MEQ tablet  Commonly known as:  K-DUR,KLOR-CON  Take 1 tablet (10 mEq total) by mouth daily.     ramipril 10 MG capsule  Commonly known as:  ALTACE  Take 1 capsule (10 mg total) by mouth daily.     simvastatin 20 MG tablet  Commonly known as:  ZOCOR  Take 20 mg by mouth at bedtime.     timolol 0.5 % ophthalmic solution  Commonly known as:  TIMOPTIC  1 drop daily.     vitamin B-12 1000 MCG tablet  Commonly known as:  CYANOCOBALAMIN  Take 1,000 mcg by mouth daily.     Vitamin D3 1000  units Caps  Take 1,000 Units by mouth daily.        Allergies: No Known Allergies  Family History: Family History  Problem Relation Age of Onset  . Bladder Cancer Neg Hx   . Kidney cancer Neg Hx     Social History:  reports that she has never smoked. She has never used smokeless tobacco. She reports that she does not drink alcohol or use illicit drugs.  ROS: UROLOGY Frequent Urination?: No Hard to postpone urination?: No Burning/pain with urination?: No Get up at night to urinate?: No Leakage of urine?: No Urine stream starts and stops?: No Trouble starting stream?: Yes Do you have to strain to urinate?: Yes Blood in urine?: No Urinary tract infection?: No Sexually transmitted disease?: No Injury to kidneys or bladder?: No Painful intercourse?: No Weak stream?: No Currently pregnant?: No Vaginal bleeding?: No Last menstrual period?: n  Gastrointestinal Nausea?: No Vomiting?: No Indigestion/heartburn?: No Diarrhea?: No Constipation?: Yes  Constitutional Fever: No Night sweats?: No Weight loss?: Yes Fatigue?: No  Skin Skin rash/lesions?: No Itching?: No  Eyes Blurred vision?: No Double vision?: No  Ears/Nose/Throat Sore throat?: No Sinus problems?: No  Hematologic/Lymphatic Swollen glands?: No Easy bruising?: No  Cardiovascular Leg swelling?: No Chest pain?: No  Respiratory Cough?: No Shortness of breath?: No  Endocrine Excessive thirst?: No  Musculoskeletal Back pain?: No Joint pain?: No  Neurological Headaches?: No Dizziness?: No  Psychologic Depression?: No Anxiety?: No  Physical Exam: Ht 5\' 4"  (1.626 m)  Wt 154 lb 1.6 oz (69.899 kg)  BMI 26.44 kg/m2  Constitutional:  Alert and oriented, No acute distress. HEENT: [place] AT, moist mucus membranes.  Trachea midline, no masses. Cardiovascular: No clubbing, cyanosis, or edema. Respiratory: Normal respiratory effort, no increased work of breathing. GI: Abdomen is soft,  nontender, nondistended, no abdominal masses small umbilical hernia.  No suprapubic fullness or tenderness. GU: No CVA tenderness.  Skin: No rashes, bruises or suspicious lesions. Neurologic: Grossly intact, no focal deficits, moving all 4 extremities. Psychiatric: Normal mood and affect.  Laboratory Data: Lab Results  Component Value Date   WBC 11.0 08/22/2015   HGB 12.5 08/22/2015   HCT 39.5 08/22/2015   MCV 88.5 08/22/2015   PLT 306 08/22/2015    Lab Results  Component Value Date   CREATININE 0.72 08/22/2015    Lab Results  Component Value Date   HGBA1C 7.7* 11/14/2012    Urinalysis Unable to void today  Pertinent Imaging: Results for orders placed  or performed in visit on 09/29/15  BLADDER SCAN AMB NON-IMAGING  Result Value Ref Range   Scan Result 9ml     Assessment & Plan:    1. [place] Minimal urine output since yesterday but not drinking much. I suspect she is somewhat dehydrated. No evidence of retention today. - BLADDER SCAN AMB NON-IMAGING  2. History of urinary retention No evidence of retention today, likely related to underlying constipation in the past  3. Constipation, unspecified constipation type Recommend resuming daily MiraLAX along with Colace twice a day for constipation   Hollice Espy, MD  [place] [address] [place], [place] [address] [phone]

## 2015-10-10 ENCOUNTER — Ambulatory Visit: Payer: PPO

## 2015-10-18 DIAGNOSIS — E785 Hyperlipidemia, unspecified: Secondary | ICD-10-CM | POA: Diagnosis not present

## 2015-10-18 DIAGNOSIS — E039 Hypothyroidism, unspecified: Secondary | ICD-10-CM | POA: Diagnosis not present

## 2015-10-18 DIAGNOSIS — I480 Paroxysmal atrial fibrillation: Secondary | ICD-10-CM | POA: Diagnosis not present

## 2015-10-18 DIAGNOSIS — E119 Type 2 diabetes mellitus without complications: Secondary | ICD-10-CM | POA: Diagnosis not present

## 2015-10-18 DIAGNOSIS — I10 Essential (primary) hypertension: Secondary | ICD-10-CM | POA: Diagnosis not present

## 2015-10-25 DIAGNOSIS — E039 Hypothyroidism, unspecified: Secondary | ICD-10-CM | POA: Diagnosis not present

## 2015-10-25 DIAGNOSIS — E119 Type 2 diabetes mellitus without complications: Secondary | ICD-10-CM | POA: Diagnosis not present

## 2015-10-25 DIAGNOSIS — E785 Hyperlipidemia, unspecified: Secondary | ICD-10-CM | POA: Diagnosis not present

## 2015-10-25 DIAGNOSIS — I10 Essential (primary) hypertension: Secondary | ICD-10-CM | POA: Diagnosis not present

## 2015-11-15 ENCOUNTER — Other Ambulatory Visit: Payer: Self-pay

## 2015-11-15 MED ORDER — FLECAINIDE ACETATE 100 MG PO TABS: 100.0000 mg | ORAL_TABLET | Freq: Two times a day (BID) | ORAL | Status: DC

## 2015-11-20 ENCOUNTER — Other Ambulatory Visit: Payer: Self-pay

## 2015-11-20 MED ORDER — RAMIPRIL 10 MG PO CAPS: 10.0000 mg | ORAL_CAPSULE | Freq: Every day | ORAL | Status: DC

## 2015-11-20 MED ORDER — FLECAINIDE ACETATE 100 MG PO TABS: 100.0000 mg | ORAL_TABLET | Freq: Two times a day (BID) | ORAL | Status: DC

## 2015-11-20 NOTE — Telephone Encounter (Signed)
Refill sent for Ramipril 10 mg

## 2015-11-20 NOTE — Telephone Encounter (Signed)
Refill sent for Flecainide.

## 2015-11-21 ENCOUNTER — Other Ambulatory Visit: Payer: Self-pay

## 2015-11-21 MED ORDER — FLECAINIDE ACETATE 100 MG PO TABS: 100.0000 mg | ORAL_TABLET | Freq: Two times a day (BID) | ORAL | Status: DC

## 2015-11-21 NOTE — Telephone Encounter (Signed)
Refill sent for Flecainide acetate 100 mg one tablet bid

## 2015-12-14 ENCOUNTER — Encounter: Payer: Self-pay | Admitting: Cardiovascular Disease

## 2015-12-14 ENCOUNTER — Ambulatory Visit (INDEPENDENT_AMBULATORY_CARE_PROVIDER_SITE_OTHER): Payer: PPO | Admitting: Cardiovascular Disease

## 2015-12-14 VITALS — BP 170/80 | HR 54 | Ht 64.0 in | Wt 163.0 lb

## 2015-12-14 DIAGNOSIS — I10 Essential (primary) hypertension: Secondary | ICD-10-CM | POA: Diagnosis not present

## 2015-12-14 DIAGNOSIS — I5033 Acute on chronic diastolic (congestive) heart failure: Secondary | ICD-10-CM | POA: Diagnosis not present

## 2015-12-14 DIAGNOSIS — I480 Paroxysmal atrial fibrillation: Secondary | ICD-10-CM

## 2015-12-14 DIAGNOSIS — E1159 Type 2 diabetes mellitus with other circulatory complications: Secondary | ICD-10-CM

## 2015-12-14 DIAGNOSIS — E785 Hyperlipidemia, unspecified: Secondary | ICD-10-CM

## 2015-12-14 MED ORDER — AMLODIPINE BESYLATE 5 MG PO TABS: 5.0000 mg | ORAL_TABLET | Freq: Every day | ORAL | Status: DC

## 2015-12-14 NOTE — Assessment & Plan Note (Signed)
We have encouraged continued exercise, careful diet management   

## 2015-12-14 NOTE — Assessment & Plan Note (Signed)
Cholesterol is at goal on the current lipid regimen. No changes to the medications were made.  

## 2015-12-14 NOTE — Assessment & Plan Note (Signed)
She is not taking Lasix, denies any weight gain, shortness of breath Recommended she continue to monitor her weight

## 2015-12-14 NOTE — Progress Notes (Signed)
Patient ID: Rhonda Hurley, female    DOB: 1926-09-17, 82 y.o.   MRN: [id number]  HPI Comments: Rhonda Hurley is a very pleasant 82 year old woman with a history of paroxysmal atrial fibrillation, prior TIA in March 2014,  diabetes, hypertension, hypothyroidism, echocardiogram read by outside cardiologist suggesting moderate mitral valve regurgitation and diastolic dysfunction who presents for routine followup of her atrial fibrillation She had successful cardioversion, following cardioversion needed Lasix for shortness of breath Previous TIA March 2014  In follow-up today, she denies any symptoms concerning for atrial fibrillation She does not take Lasix Reports blood pressure has been running high, typically 140 up to 160 No regular exercise program, otherwise no complaints Denies any significant leg edema  EKG on today's visit shows normal sinus rhythm with rate 54 bpm, left bundle branch block   lab work reviewed with her today showing total cholesterol 166, LDL 92  Other past medical history  having episode of atrial fibrillation 10 days ago.  TIA episode with hospital admission 11/14/2012. She recovered well. Head scans did not show large stroke. She was in atrial fibrillation at that time. She had refused anticoagulation in the past as she had bleeding of her gums. Now she takes anticoagulation. No recent bleeding Previous lab work Hemoglobin A1c 7.4. Total cholesterol 159, LDL 83  Previous  Echocardiogram done in the hospital showed ejection fraction 50-55%, possible hypokinesis of anterior and anteroseptal wall, mild to moderate LVH, normal right ventricular systolic pressures  Remote cardiac catheterization several years ago when she first developed atrial fibrillation with no significant disease by report Echocardiogram dated July 2008 showing normal systolic function, moderate MR, mild TR, diastolic dysfunction.    No Known Allergies  Outpatient Encounter Prescriptions as  of 12/14/2015  Medication Sig  . Cholecalciferol (VITAMIN D3) 1000 UNITS CAPS Take 1,000 Units by mouth daily.   Marland Kitchen docusate sodium (COLACE) 100 MG capsule Take 1 capsule (100 mg total) by mouth 2 (two) times daily as needed (take to keep stool soft.).  Marland Kitchen ELIQUIS 5 MG TABS tablet TAKE 1 TABLET (5 MG TOTAL) BY MOUTH 2 (TWO) TIMES DAILY.  . flecainide (TAMBOCOR) 100 MG tablet Take 1 tablet (100 mg total) by mouth 2 (two) times daily.  Marland Kitchen levothyroxine (SYNTHROID, LEVOTHROID) 100 MCG tablet Take 1 tablet (100 mcg total) by mouth daily before breakfast.  . metFORMIN (GLUCOPHAGE) 500 MG tablet Take 1,000 mg by mouth 2 (two) times daily.  . metoprolol tartrate (LOPRESSOR) 25 MG tablet Take 25 mg by mouth 2 (two) times daily.  . potassium chloride SA (K-DUR,KLOR-CON) 10 MEQ tablet Take 1 tablet (10 mEq total) by mouth daily.  . ramipril (ALTACE) 10 MG capsule Take 1 capsule (10 mg total) by mouth daily.  . simvastatin (ZOCOR) 20 MG tablet Take 20 mg by mouth at bedtime.    . timolol (TIMOPTIC) 0.5 % ophthalmic solution 1 drop daily.   . vitamin B-12 (CYANOCOBALAMIN) 1000 MCG tablet Take 1,000 mcg by mouth daily.  . [DISCONTINUED] furosemide (LASIX) 20 MG tablet Take 1 tablet (20 mg total) by mouth 2 (two) times daily as needed.  . [DISCONTINUED] levothyroxine (SYNTHROID, LEVOTHROID) 88 MCG tablet Take 100 mcg by mouth daily before breakfast.  . [DISCONTINUED] polyethylene glycol (MIRALAX / GLYCOLAX) packet Take 17 g by mouth daily.  Marland Kitchen amLODipine (NORVASC) 5 MG tablet Take 1 tablet (5 mg total) by mouth daily.   No facility-administered encounter medications on file as of 12/14/2015.    Past Medical History  Diagnosis Date  . Type II or unspecified type diabetes mellitus without mention of complication, not stated as uncontrolled   . Mitral valve disorders     a. echo 03/2007: nl LVEF, mod MR, mmild TR, DD; b. echo 10/2012: EF 50-55%, possible HK of anterior & anteroseptal wall, mild to mod LVH, nl RVSP   . Essential hypertension, benign   . PAF (paroxysmal atrial fibrillation) (HCC)     a. on Eliquis; b. CHADSVASc at least 7 (HTN, DM, age x 2, stroke, female)  . Other and unspecified hyperlipidemia   . Unspecified hypothyroidism   . 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 Acute cerebrovascular accident [facility])     a. 10/2012: MRI concern for posible small acute infarct along the left parietal cortex  . TIA (transient ischemic attack) 2014  . Acute on chronic diastolic CHF (congestive heart failure) ([place]) 06/19/2015  . Atrial fibrillation ([place]) 02/05/2011  . BP (high blood pressure) 04/08/2014  . HTN (hypertension) 02/05/2011    Past Surgical History  Procedure Laterality Date  . Splenectomy    . Breast lumpectomy  12/1996    left   . Radioactive ablation   1999    thyroid glan  . Cataract extraction  2000    left  . Cataract extraction  11/2010    right  . Electrophysiologic study N/A 06/13/2015    Procedure: Cardioversion;  Surgeon: Minna Merritts, MD;  Location: [facility] ORS;  Service: Cardiovascular;  Laterality: N/A;    Social History  reports that she has never smoked. She has never used smokeless tobacco. She reports that she does not drink alcohol or use illicit drugs.  Family History family history is negative for Bladder Cancer and Kidney cancer.  Review of Systems  Constitutional: Negative.   Respiratory: Negative.   Cardiovascular: Negative.   Gastrointestinal: Negative.   Endocrine: Negative.   Musculoskeletal: Negative.   Neurological: Negative.   Hematological: Negative.   Psychiatric/Behavioral: Negative.   All other systems reviewed and are negative.   BP 170/80 mmHg  Pulse 54  Ht 5\' 4"  (1.626 m)  Wt 163 lb (73.936 kg)  BMI 27.97 kg/m2  Physical Exam  Constitutional: She is oriented to person, place, and time. She appears well-developed and well-nourished.  HENT:  Head: Normocephalic.  Nose: Nose normal.  Mouth/Throat: Oropharynx is clear  and moist.  Eyes: Conjunctivae are normal. Pupils are equal, round, and reactive to light.  Neck: Normal range of motion. Neck supple. No JVD present.  Cardiovascular: Normal rate, S1 normal, S2 normal and intact distal pulses.  An irregularly irregular rhythm present. Exam reveals no gallop and no friction rub.   Murmur heard.  Crescendo systolic murmur is present with a grade of 2/6  Pulmonary/Chest: Effort normal and breath sounds normal. No respiratory distress. She has no wheezes. She has no rales. She exhibits no tenderness.  Abdominal: Soft. Bowel sounds are normal. She exhibits no distension. There is no tenderness.  Musculoskeletal: Normal range of motion. She exhibits no edema or tenderness.  Lymphadenopathy:    She has no cervical adenopathy.  Neurological: She is alert and oriented to person, place, and time. Coordination normal.  Skin: Skin is warm and dry. No rash noted. No erythema.  Psychiatric: She has a normal mood and affect. Her behavior is normal. Judgment and thought content normal.    Assessment and Plan  Nursing note and vitals reviewed.

## 2015-12-14 NOTE — Patient Instructions (Signed)
You are doing well.  Please start amlodipine one a day for blood pressure Please continue to monitor your blood pressure  Please call us if you have new issues that need to be addressed before your next appt.  Your physician wants you to follow-up in: 6 months.  You will receive a reminder letter in the mail two months in advance. If you don't receive a letter, please call our office to schedule the follow-up appointment.

## 2015-12-14 NOTE — Assessment & Plan Note (Signed)
No recent symptoms concerning for atrial fibrillation Tolerating anticoagulation We'll continue current medications

## 2015-12-14 NOTE — Assessment & Plan Note (Signed)
Blood pressure elevated today and at home, recommended she start amlodipine 5 mg daily We could increase the dose if blood pressure continues to run high

## 2016-01-02 DIAGNOSIS — R946 Abnormal results of thyroid function studies: Secondary | ICD-10-CM | POA: Diagnosis not present

## 2016-01-09 DIAGNOSIS — H401131 Primary open-angle glaucoma, bilateral, mild stage: Secondary | ICD-10-CM | POA: Diagnosis not present

## 2016-03-10 ENCOUNTER — Other Ambulatory Visit: Payer: Self-pay | Admitting: Cardiovascular Disease

## 2016-04-24 DIAGNOSIS — E785 Hyperlipidemia, unspecified: Secondary | ICD-10-CM | POA: Diagnosis not present

## 2016-04-24 DIAGNOSIS — I10 Essential (primary) hypertension: Secondary | ICD-10-CM | POA: Diagnosis not present

## 2016-04-24 DIAGNOSIS — E039 Hypothyroidism, unspecified: Secondary | ICD-10-CM | POA: Diagnosis not present

## 2016-04-24 DIAGNOSIS — Z23 Encounter for immunization: Secondary | ICD-10-CM | POA: Diagnosis not present

## 2016-04-24 DIAGNOSIS — I480 Paroxysmal atrial fibrillation: Secondary | ICD-10-CM | POA: Diagnosis not present

## 2016-04-24 DIAGNOSIS — E119 Type 2 diabetes mellitus without complications: Secondary | ICD-10-CM | POA: Diagnosis not present

## 2016-06-18 ENCOUNTER — Ambulatory Visit: Payer: PPO | Admitting: Cardiovascular Disease

## 2016-07-09 DIAGNOSIS — R946 Abnormal results of thyroid function studies: Secondary | ICD-10-CM | POA: Diagnosis not present

## 2016-07-11 ENCOUNTER — Ambulatory Visit (INDEPENDENT_AMBULATORY_CARE_PROVIDER_SITE_OTHER): Payer: PPO | Admitting: Cardiovascular Disease

## 2016-07-11 ENCOUNTER — Encounter: Payer: Self-pay | Admitting: Cardiovascular Disease

## 2016-07-11 VITALS — BP 160/62 | HR 55 | Ht 64.0 in | Wt 169.5 lb

## 2016-07-11 DIAGNOSIS — E7800 Pure hypercholesterolemia, unspecified: Secondary | ICD-10-CM

## 2016-07-11 DIAGNOSIS — I10 Essential (primary) hypertension: Secondary | ICD-10-CM | POA: Diagnosis not present

## 2016-07-11 DIAGNOSIS — I5033 Acute on chronic diastolic (congestive) heart failure: Secondary | ICD-10-CM

## 2016-07-11 DIAGNOSIS — I480 Paroxysmal atrial fibrillation: Secondary | ICD-10-CM | POA: Diagnosis not present

## 2016-07-11 DIAGNOSIS — R0602 Shortness of breath: Secondary | ICD-10-CM

## 2016-07-11 DIAGNOSIS — E119 Type 2 diabetes mellitus without complications: Secondary | ICD-10-CM

## 2016-07-11 MED ORDER — AMLODIPINE BESYLATE 10 MG PO TABS: 10.0000 mg | ORAL_TABLET | Freq: Every day | ORAL | 3 refills | Status: DC

## 2016-07-11 NOTE — Progress Notes (Signed)
Cardiology Office Note  Date:  07/11/2016   ID:  Rhonda Hurley, DOB 1927-01-23, MRN [id number]  PCP:  Rhonda Pitch, MD   Chief Complaint  Patient presents with  . other    6 month follow up. Meds reviewed by the pt. verbally. "doing well."     HPI:  [name] is a very pleasant 82 year old woman with a history of paroxysmal atrial fibrillation, prior TIA in March 2014,  diabetes, hypertension, hypothyroidism, echocardiogram read by outside cardiologist suggesting moderate mitral valve regurgitation and diastolic dysfunction who presents for routine followup of her atrial fibrillation She had successful cardioversion, following cardioversion needed Lasix for shortness of breath Previous TIA March 2014  In follow-up today she reports that she is doing well except for her blood thinners which are very expensive In doughnut hole, eliquis expensive, $150 Denies any episodes of bleeding  she denies any symptoms concerning for atrial fibrillation Denies any shortness of breath or leg swelling She does not take Lasix  blood pressure has been running high, typically 140 up to 160 No regular exercise program, otherwise no complaints  Previous lab work Hemoglobin A1c 7.4. Total cholesterol 159, LDL 83  EKG on today's visit shows normal sinus rhythm with rate 55 bpm, left bundle branch block Other past medical history reviewed  TIA episode with hospital admission 11/14/2012. Head scans did not show large stroke. She was in atrial fibrillation at that time. She had refused warfarin in the past as she had bleeding of her gums.   Previous  Echocardiogram done in the hospital showed ejection fraction 50-55%, possible hypokinesis of anterior and anteroseptal wall, mild to moderate LVH, normal right ventricular systolic pressures  Remote cardiac catheterization several years ago when she first developed atrial fibrillation with no significant disease by report Echocardiogram dated  July 2008 showing normal systolic function, moderate MR, mild TR, diastolic dysfunction.  PMH:   has a past medical history of Acute cerebrovascular accident [facility]); Acute on chronic diastolic CHF (congestive heart failure) ([place]) (06/19/2015); Atrial fibrillation ([place]) (02/05/2011); BP (high blood pressure) (04/08/2014); Derangement of posterior horn of medial meniscus; Essential hypertension, benign; HTN (hypertension) (02/05/2011); Mitral valve disorders(424.0); Other and unspecified hyperlipidemia; PAF (paroxysmal atrial fibrillation) ([place]); TIA (transient ischemic attack) (2014); Type II or unspecified type diabetes mellitus without mention of complication, not stated as uncontrolled; and Unspecified hypothyroidism.  PSH:    Past Surgical History:  Procedure Laterality Date  . BREAST LUMPECTOMY  12/1996   left   . CATARACT EXTRACTION  2000   left  . CATARACT EXTRACTION  11/2010   right  . ELECTROPHYSIOLOGIC STUDY N/A 06/13/2015   Procedure: Cardioversion;  Surgeon: Minna Merritts, MD;  Location: [facility] ORS;  Service: Cardiovascular;  Laterality: N/A;  . radioactive ablation   1999   thyroid glan  . SPLENECTOMY      Current Outpatient Prescriptions  Medication Sig Dispense Refill  . amLODipine (NORVASC) 5 MG tablet Take 1 tablet (5 mg total) by mouth daily. 90 tablet 3  . Cholecalciferol (VITAMIN D3) 1000 UNITS CAPS Take 1,000 Units by mouth daily.     Marland Kitchen ELIQUIS 5 MG TABS tablet TAKE 1 TABLET (5 MG TOTAL) BY MOUTH 2 (TWO) TIMES DAILY. 60 tablet 3  . flecainide (TAMBOCOR) 100 MG tablet Take 1 tablet (100 mg total) by mouth 2 (two) times daily. 180 tablet 3  . levothyroxine (SYNTHROID, LEVOTHROID) 100 MCG tablet Take 1 tablet (100 mcg total) by mouth daily before breakfast.    .  metFORMIN (GLUCOPHAGE) 500 MG tablet Take 1,000 mg by mouth 2 (two) times daily.  0  . metoprolol tartrate (LOPRESSOR) 25 MG tablet Take 25 mg by mouth 2 (two) times daily.    . ramipril (ALTACE) 10 MG capsule Take 1  capsule (10 mg total) by mouth daily. 90 capsule 3  . simvastatin (ZOCOR) 20 MG tablet Take 20 mg by mouth at bedtime.      . timolol (TIMOPTIC) 0.5 % ophthalmic solution 1 drop daily.     . vitamin B-12 (CYANOCOBALAMIN) 1000 MCG tablet Take 1,000 mcg by mouth daily.     No current facility-administered medications for this visit.      Allergies:   Patient has no known allergies.   Social History:  The patient  reports that she has never smoked. She has never used smokeless tobacco. She reports that she does not drink alcohol or use drugs.   Family History:   family history is not on file.    Review of Systems: Review of Systems  Constitutional: Negative.   Respiratory: Negative.   Cardiovascular: Negative.   Gastrointestinal: Negative.   Musculoskeletal: Negative.   Neurological: Negative.   Psychiatric/Behavioral: Negative.   All other systems reviewed and are negative.    PHYSICAL EXAM: VS:  BP (!) 160/62 (BP Location: Left Arm, Patient Position: Sitting, Cuff Size: Normal)   Pulse (!) 55   Ht 5\' 4"  (1.626 m)   Wt 169 lb 8 oz (76.9 kg)   BMI 29.09 kg/m  , BMI Body mass index is 29.09 kg/m. GEN: Well nourished, well developed, in no acute distress  HEENT: normal  Neck: no JVD, carotid bruits, or masses Cardiac: RRR; no murmurs, rubs, or gallops,no edema  Respiratory:  clear to auscultation bilaterally, normal work of breathing GI: soft, nontender, nondistended, + BS MS: no deformity or atrophy  Skin: warm and dry, no rash Neuro:  Strength and sensation are intact Psych: euthymic mood, full affect    Recent Labs: 08/22/2015: ALT 13; B Natriuretic Peptide 197.0; BUN 23; Creatinine, Ser 0.72; Hemoglobin 12.5; Platelets 306; Potassium 3.9; Sodium 141    Lipid Panel No results found for: CHOL, HDL, LDLCALC, TRIG    Wt Readings from Last 3 Encounters:  07/11/16 169 lb 8 oz (76.9 kg)  12/14/15 163 lb (73.9 kg)  09/29/15 154 lb 1.6 oz (69.9 kg)        ASSESSMENT AND PLAN:  Pure hypercholesterolemia Cholesterol is at goal on the current lipid regimen. No changes to the medications were made.  Essential hypertension - Plan: EKG 12-Lead Blood pressure elevated, recommended she increase amlodipine up to 10 mg daily  Paroxysmal atrial fibrillation (HCC) - Plan: EKG 12-Lead On anticoagulation Samples provided Denies symptoms of palpitations concerning for arrhythmia  Acute on chronic diastolic CHF (congestive heart failure) ([place]) - Plan: EKG 12-Lead Appears relatively euvolemic on today's visit Currently not taking diuretics  Type 2 diabetes mellitus without complication, without long-term current use of insulin ([place]) We have encouraged continued exercise, careful diet management in an effort to lose weight. Previous hemoglobin A1c elevated  SOB (shortness of breath) - Plan: EKG 12-Lead Recommended regular exercise program for conditioning    Total encounter time more than 25 minutes  Greater than 50% was spent in counseling and coordination of care with the patient   Disposition:   F/U  6 months   Orders Placed This Encounter  Procedures  . EKG 12-Lead     Signed, Esmond Plants, M.D., Ph.D.  07/11/2016  [place], [place]

## 2016-07-11 NOTE — Patient Instructions (Addendum)
Medication Instructions:   Please increase the amlodipine (norvasc) up to 10 mg daily (use up your 5 mg pills)  Labwork:  No new labs needed  Testing/Procedures:  No further testing at this time   Follow-Up: It was a pleasure seeing you in the office today. Please call us if you have new issues that need to be addressed before your next appt.  [phone]  Your physician wants you to follow-up in: 12 months.  You will receive a reminder letter in the mail two months in advance. If you don't receive a letter, please call our office to schedule the follow-up appointment.  If you need a refill on your cardiac medications before your next appointment, please call your pharmacy.

## 2016-08-06 DIAGNOSIS — H401132 Primary open-angle glaucoma, bilateral, moderate stage: Secondary | ICD-10-CM | POA: Diagnosis not present

## 2016-08-12 DIAGNOSIS — H401131 Primary open-angle glaucoma, bilateral, mild stage: Secondary | ICD-10-CM | POA: Diagnosis not present

## 2016-09-02 ENCOUNTER — Other Ambulatory Visit: Payer: Self-pay | Admitting: Cardiovascular Disease

## 2016-09-16 DIAGNOSIS — R946 Abnormal results of thyroid function studies: Secondary | ICD-10-CM | POA: Diagnosis not present

## 2016-11-08 DIAGNOSIS — E785 Hyperlipidemia, unspecified: Secondary | ICD-10-CM | POA: Diagnosis not present

## 2016-11-08 DIAGNOSIS — R200 Anesthesia of skin: Secondary | ICD-10-CM | POA: Diagnosis not present

## 2016-11-08 DIAGNOSIS — E039 Hypothyroidism, unspecified: Secondary | ICD-10-CM | POA: Diagnosis not present

## 2016-11-08 DIAGNOSIS — I10 Essential (primary) hypertension: Secondary | ICD-10-CM | POA: Diagnosis not present

## 2016-11-08 DIAGNOSIS — E119 Type 2 diabetes mellitus without complications: Secondary | ICD-10-CM | POA: Diagnosis not present

## 2016-11-13 DIAGNOSIS — E785 Hyperlipidemia, unspecified: Secondary | ICD-10-CM | POA: Diagnosis not present

## 2016-11-13 DIAGNOSIS — R200 Anesthesia of skin: Secondary | ICD-10-CM | POA: Diagnosis not present

## 2016-11-13 DIAGNOSIS — E119 Type 2 diabetes mellitus without complications: Secondary | ICD-10-CM | POA: Diagnosis not present

## 2016-11-13 DIAGNOSIS — E039 Hypothyroidism, unspecified: Secondary | ICD-10-CM | POA: Diagnosis not present

## 2016-11-13 DIAGNOSIS — I10 Essential (primary) hypertension: Secondary | ICD-10-CM | POA: Diagnosis not present

## 2016-12-08 ENCOUNTER — Other Ambulatory Visit: Payer: Self-pay | Admitting: Cardiovascular Disease

## 2016-12-22 ENCOUNTER — Other Ambulatory Visit: Payer: Self-pay | Admitting: Cardiovascular Disease

## 2016-12-24 ENCOUNTER — Other Ambulatory Visit: Payer: Self-pay

## 2016-12-24 MED ORDER — APIXABAN 5 MG PO TABS: ORAL_TABLET | ORAL | 6 refills | Status: DC

## 2016-12-24 NOTE — Telephone Encounter (Signed)
Refill for Eliquis 5 mg sent

## 2017-01-26 ENCOUNTER — Other Ambulatory Visit: Payer: Self-pay | Admitting: Cardiovascular Disease

## 2017-04-04 ENCOUNTER — Emergency Department
Admission: EM | Admit: 2017-04-04 | Discharge: 2017-04-05 | Disposition: A | Discharge status: Home / Self Care | Payer: PPO | Attending: Emergency Medicine | Admitting: Emergency Medicine

## 2017-04-04 ENCOUNTER — Emergency Department: Payer: PPO

## 2017-04-04 DIAGNOSIS — R05 Cough: Secondary | ICD-10-CM | POA: Diagnosis not present

## 2017-04-04 DIAGNOSIS — Z7901 Long term (current) use of anticoagulants: Secondary | ICD-10-CM | POA: Insufficient documentation

## 2017-04-04 DIAGNOSIS — Z79899 Other long term (current) drug therapy: Secondary | ICD-10-CM | POA: Diagnosis not present

## 2017-04-04 DIAGNOSIS — R1084 Generalized abdominal pain: Secondary | ICD-10-CM | POA: Insufficient documentation

## 2017-04-04 DIAGNOSIS — E039 Hypothyroidism, unspecified: Secondary | ICD-10-CM | POA: Diagnosis not present

## 2017-04-04 DIAGNOSIS — K5730 Diverticulosis of large intestine without perforation or abscess without bleeding: Secondary | ICD-10-CM | POA: Diagnosis not present

## 2017-04-04 DIAGNOSIS — E119 Type 2 diabetes mellitus without complications: Secondary | ICD-10-CM | POA: Diagnosis not present

## 2017-04-04 DIAGNOSIS — R109 Unspecified abdominal pain: Secondary | ICD-10-CM | POA: Diagnosis present

## 2017-04-04 DIAGNOSIS — Z7984 Long term (current) use of oral hypoglycemic drugs: Secondary | ICD-10-CM | POA: Diagnosis not present

## 2017-04-04 DIAGNOSIS — I10 Essential (primary) hypertension: Secondary | ICD-10-CM | POA: Insufficient documentation

## 2017-04-04 LAB — CBC WITH DIFFERENTIAL/PLATELET
BASOS ABS: 0.2 10*3/uL — AB (ref 0–0.1)
BASOS PCT: 2 %
EOS ABS: 0.1 10*3/uL (ref 0–0.7)
Eosinophils Relative: 1 %
HCT: 34.5 % — ABNORMAL LOW (ref 35.0–47.0)
HEMOGLOBIN: 11.7 g/dL — AB (ref 12.0–16.0)
Lymphocytes Relative: 17 %
Lymphs Abs: 2 10*3/uL (ref 1.0–3.6)
MCH: 29.7 pg (ref 26.0–34.0)
MCHC: 34 g/dL (ref 32.0–36.0)
MCV: 87.5 fL (ref 80.0–100.0)
MONO ABS: 1.6 10*3/uL — AB (ref 0.2–0.9)
MONOS PCT: 14 %
NEUTROS ABS: 7.6 10*3/uL — AB (ref 1.4–6.5)
NEUTROS PCT: 66 %
Platelets: 328 10*3/uL (ref 150–440)
RBC: 3.94 MIL/uL (ref 3.80–5.20)
RDW: 16 % — ABNORMAL HIGH (ref 11.5–14.5)
WBC: 11.6 10*3/uL — ABNORMAL HIGH (ref 3.6–11.0)

## 2017-04-04 MED ORDER — MORPHINE SULFATE (PF) 2 MG/ML IV SOLN
2.0000 mg | Freq: Once | INTRAVENOUS | Status: AC
  Administered 2017-04-05: 2 mg via INTRAVENOUS
  Filled 2017-04-04: qty 1

## 2017-04-04 MED ORDER — ONDANSETRON HCL 4 MG/2ML IJ SOLN
4.0000 mg | Freq: Once | INTRAMUSCULAR | Status: AC
  Administered 2017-04-05: 4 mg via INTRAVENOUS
  Filled 2017-04-04: qty 2

## 2017-04-04 NOTE — ED Provider Notes (Signed)
[facility] Emergency Department Provider Note  ____________________________________________   First MD Initiated Contact with Patient 04/04/17 2338     (approximate)  I have reviewed the triage vital signs and the nursing notes.   HISTORY  Chief Complaint Abdominal Pain   HPI Rhonda Hurley is a 83 y.o. female who comes to the emergency department with 3 days of upper abdominal discomfort. She says she does not have any "pain" but does feel like it "hurts inside". Nothing seems to make it better or worse. It's in her upper abdomen more than it is in her lower chest. It is nonexertional. Not associated with shortness of breath. She has a past medical history of intermittent atrial fibrillation, hypertension, and previous stroke. She says she has not had atrial fibrillation for several months after beginning flecainide. She's had no leg swelling. Her pain is moderate to severe. Associated with nausea but no vomiting. It does not radiate and it does not go straight to her back it is not ripping or tearing.   Past Medical History:  Diagnosis Date  . Acute cerebrovascular accident [facility])    a. 10/2012: MRI concern for posible small acute infarct along the left parietal cortex  . Acute on chronic diastolic CHF (congestive heart failure) ([place]) 06/19/2015  . Atrial fibrillation ([place]) 02/05/2011  . BP (high blood pressure) 04/08/2014  . 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 Essential hypertension, benign   . HTN (hypertension) 02/05/2011  . Mitral valve disorders(424.0)    a. echo 03/2007: nl LVEF, mod MR, mmild TR, DD; b. echo 10/2012: EF 50-55%, possible HK of anterior & anteroseptal wall, mild to mod LVH, nl RVSP  . Other and unspecified hyperlipidemia   . PAF (paroxysmal atrial fibrillation) (HCC)    a. on Eliquis; b. CHADSVASc at least 7 (HTN, DM, age x 2, stroke, female)  . TIA (transient ischemic attack) 2014  . Type II or unspecified type  diabetes mellitus without mention of complication, not stated as uncontrolled   . Unspecified hypothyroidism     Patient Active Problem List   Diagnosis Date Noted  . Acute on chronic diastolic CHF (congestive heart failure) ([place]) 06/19/2015  . SOB (shortness of breath) 06/09/2015  . PAF (paroxysmal atrial fibrillation) ([place])   . Derangement of medial meniscus, posterior horn 12/12/2014  . Knee strain 12/02/2014  . Arthritis of knee, degenerative 12/02/2014  . Acquired hypothyroidism 10/14/2014  . Type 2 diabetes mellitus ([place]) 04/08/2014  . Personal history of malignant neoplasm of breast 04/08/2014  . BP (high blood pressure) 04/08/2014  . Paroxysmal atrial fibrillation ([place]) 04/08/2014  . Cough 08/14/2011  . Hyperlipidemia 02/05/2011  . HTN (hypertension) 02/05/2011  . Atrial fibrillation ([place]) 02/05/2011  . Diabetes mellitus ([place]) 02/05/2011    Past Surgical History:  Procedure Laterality Date  . BREAST LUMPECTOMY  12/1996   left   . CATARACT EXTRACTION  2000   left  . CATARACT EXTRACTION  11/2010   right  . ELECTROPHYSIOLOGIC STUDY N/A 06/13/2015   Procedure: Cardioversion;  Surgeon: Minna Merritts, MD;  Location: [facility] ORS;  Service: Cardiovascular;  Laterality: N/A;  . radioactive ablation   1999   thyroid glan  . SPLENECTOMY      Prior to Admission medications   Medication Sig Start Date End Date Taking? Authorizing Provider  amLODipine (NORVASC) 10 MG tablet Take 1 tablet (10 mg total) by mouth daily. 07/11/16  Yes Minna Merritts, MD  Cholecalciferol (VITAMIN  D3) 1000 UNITS CAPS Take 1,000 Units by mouth daily.    Yes [provider]  ELIQUIS 5 MG TABS tablet TAKE 1 TABLET (5 MG TOTAL) BY MOUTH 2 (TWO) TIMES DAILY. 01/28/17  Yes Gollan, Kathlene November, MD  flecainide (TAMBOCOR) 100 MG tablet TAKE 1 TABLET (100 MG TOTAL) BY MOUTH 2 (TWO) TIMES DAILY. 12/23/16  Yes Gollan, Kathlene November, MD  levothyroxine (SYNTHROID, LEVOTHROID) 112 MCG tablet Take 112 mcg by mouth  every morning. 01/07/17  Yes [provider]  metFORMIN (GLUCOPHAGE) 500 MG tablet Take 1,000 mg by mouth 2 (two) times daily. 07/11/15  Yes [provider]  metoprolol tartrate (LOPRESSOR) 25 MG tablet Take 25 mg by mouth 2 (two) times daily.   Yes [provider]  ramipril (ALTACE) 10 MG capsule TAKE 1 CAPSULE (10 MG TOTAL) BY MOUTH DAILY. 12/09/16  Yes Gollan, Kathlene November, MD  simvastatin (ZOCOR) 20 MG tablet Take 20 mg by mouth at bedtime.     Yes [provider]  timolol (TIMOPTIC) 0.5 % ophthalmic solution 1 drop daily.    Yes [provider]  vitamin B-12 (CYANOCOBALAMIN) 1000 MCG tablet Take 1,000 mcg by mouth daily.   Yes [provider]    Allergies Patient has no known allergies.  Family History  Problem Relation Age of Onset  . Bladder Cancer Neg Hx   . Kidney cancer Neg Hx     Social History Social History  Substance Use Topics  . Smoking status: Never Smoker  . Smokeless tobacco: Never Used  . Alcohol use No    Review of Systems Constitutional: No fever/chills Eyes: No visual changes. ENT: No sore throat. Cardiovascular: Positive chest pain. Respiratory: Denies shortness of breath. Gastrointestinal: Positive abdominal pain.  Positive nausea, no vomiting.  No diarrhea.  No constipation. Genitourinary: Negative for dysuria. Musculoskeletal: Negative for back pain. Skin: Negative for rash. Neurological: Negative for headaches, focal weakness or numbness.   ____________________________________________   PHYSICAL EXAM:  VITAL SIGNS: ED Triage Vitals  Enc Vitals Group     BP 04/04/17 2326 (!) 152/77     Pulse Rate 04/04/17 2326 62     Resp 04/04/17 2326 17     Temp 04/04/17 2327 98.6 F (37 C)     Temp Source 04/04/17 2327 Oral     SpO2 04/04/17 2325 97 %     Weight 04/04/17 2326 170 lb (77.1 kg)     Height 04/04/17 2326 5\' 5"  (1.651 m)     Head Circumference --      Peak Flow --      Pain Score 04/04/17  2326 4     Pain Loc --      Pain Edu? --      Excl. in [place]? --     Constitutional: Alert and oriented 4 appears uncomfortable holding her upper abdomen with her right hand when I walk into the room Eyes: PERRL EOMI. Head: Atraumatic. Nose: No congestion/rhinnorhea. Mouth/Throat: No trismus Neck: No stridor.   Cardiovascular: Normal rate, regular rhythm. Grossly normal heart sounds.  Good peripheral circulation. Respiratory: Normal respiratory effort.  No retractions. Lungs CTAB and moving good air Gastrointestinal: Soft abdomen diffuse mild tenderness with no focality no rebound no guarding no frank peritonitis. 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 She's had normal bowel movements and flatus. Musculoskeletal: No lower extremity edema   Neurologic:  Normal speech and language. No gross focal neurologic  deficits are appreciated. Skin:  Skin is warm, dry and intact. No rash noted. Psychiatric: Mood and affect are normal. Speech and behavior are normal.    ____________________________________________   DIFFERENTIAL includes but not limited to  Urinary tract infection, sepsis, pneumonia, mesenteric ischemia, appendicitis, diverticulitis, cholecystitis, volvulus ____________________________________________   LABS (all labs ordered are listed, but only abnormal results are displayed)  Labs Reviewed  LACTIC ACID, PLASMA - Abnormal; Notable for the following:       Result Value   Lactic Acid, Venous 2.8 (*)    All other components within normal limits  LACTIC ACID, PLASMA - Abnormal; Notable for the following:    Lactic Acid, Venous 2.2 (*)    All other components within normal limits  CBC WITH DIFFERENTIAL/PLATELET - Abnormal; Notable for the following:    WBC 11.6 (*)    Hemoglobin 11.7 (*)    HCT 34.5 (*)    RDW 16.0 (*)    Neutro Abs 7.6 (*)    Monocytes Absolute 1.6 (*)    Basophils Absolute 0.2 (*)     All other components within normal limits  URINALYSIS, COMPLETE (UACMP) WITH MICROSCOPIC - Abnormal; Notable for the following:    Color, Urine YELLOW (*)    APPearance CLEAR (*)    Leukocytes, UA SMALL (*)    Squamous Epithelial / LPF 0-5 (*)    All other components within normal limits  BASIC METABOLIC PANEL - Abnormal; Notable for the following:    Glucose, Bld 111 (*)    All other components within normal limits  HEPATIC FUNCTION PANEL - Abnormal; Notable for the following:    ALT 12 (*)    Bilirubin, Direct <0.1 (*)    All other components within normal limits  LIPASE, BLOOD - Abnormal; Notable for the following:    Lipase 59 (*)    All other components within normal limits  TROPONIN I  TROPONIN I    Down trending lactate elevated white count of unclear clinical significance no signs of acute ischemia 2 __________________________________________  EKG  ED ECG REPORT I, Darel Hong, the attending physician, personally viewed and interpreted this ECG.  Date: 04/05/2017 EKG Time:  Rate: 62 Rhythm: normal sinus rhythm QRS Axis: Leftward Intervals: normal ST/T Wave abnormalities: normal Narrative Interpretation: Left bundle branch block with normal repolarization abnormalities abnormal EKG  ____________________________________________  RADIOLOGY  CT angiogram with no acute disease ____________________________________________   PROCEDURES  Procedure(s) performed: no  Procedures  Critical Care performed: no  Observation: no ____________________________________________   INITIAL IMPRESSION / ASSESSMENT AND PLAN / ED COURSE  Pertinent labs & imaging results that were available during my care of the patient were reviewed by me and considered in my medical decision making (see chart for details).  On arrival the patient is somewhat uncomfortable appearing. Differentials extremely broad for an elderly patient with diabetes hypertension and atrial  fibrillation but I'm primarily concerned about mesenteric ischemia or other abdominal surgical catastrophe. Pain medication labs and CT scan are pending.    ----------------------------------------- 12:30 AM on 04/05/2017 ----------------------------------------- The patient's lactate just came back elevated at 2.8. As she may very well be demonstrating pain out of proportion of her clinical exam and has a history of intermittent atrial fibrillation I am switching her CT abdomen pelvis to CT angiogram abdomen and pelvis.   _----------------------------------------- 2:38 AM on 04/05/2017 -----------------------------------------  The patient's discomfort is gone. CT NGO shows chronic changes but no acute disease. I appreciate her slightly elevated lactate however  it may be a false positive as she has no infectious symptoms and no SIRS criteria.  Repeat troponin and lactate now and if they are negative she will be stable for discharge. ___________________________________________   Troponin negative 2 and lactate is down trending. I still do not believe the patient has an infectious etiology. She'll be discharged home with follow-up on Monday.  FINAL CLINICAL IMPRESSION(S) / ED DIAGNOSES  Final diagnoses:  Generalized abdominal pain      NEW MEDICATIONS STARTED DURING THIS VISIT:  Discharge Medication List as of 04/05/2017  3:39 AM       Note:  This document was prepared using Dragon voice recognition software and may include unintentional dictation errors.     Darel Hong, MD 04/05/17 [phone]

## 2017-04-04 NOTE — ED Triage Notes (Signed)
Patient c/o epigastric pain, anorexia, malaise, weakness, nausea and diarrhea X 3 days. Pt reports dry cough for over 1 month

## 2017-04-05 ENCOUNTER — Emergency Department: Payer: PPO

## 2017-04-05 DIAGNOSIS — R05 Cough: Secondary | ICD-10-CM | POA: Diagnosis not present

## 2017-04-05 DIAGNOSIS — K5730 Diverticulosis of large intestine without perforation or abscess without bleeding: Secondary | ICD-10-CM | POA: Diagnosis not present

## 2017-04-05 LAB — LACTIC ACID, PLASMA
LACTIC ACID, VENOUS: 2.2 mmol/L — AB (ref 0.5–1.9)
Lactic Acid, Venous: 2.8 mmol/L (ref 0.5–1.9)

## 2017-04-05 LAB — HEPATIC FUNCTION PANEL
ALBUMIN: 3.5 g/dL (ref 3.5–5.0)
ALK PHOS: 80 U/L (ref 38–126)
ALT: 12 U/L — AB (ref 14–54)
AST: 22 U/L (ref 15–41)
Bilirubin, Direct: <0.1 mg/dL — ABNORMAL LOW (ref 0.1–0.5)
TOTAL PROTEIN: 6.6 g/dL (ref 6.5–8.1)
Total Bilirubin: 0.6 mg/dL (ref 0.3–1.2)

## 2017-04-05 LAB — BASIC METABOLIC PANEL
ANION GAP: 9 (ref 5–15)
BUN: 17 mg/dL (ref 6–20)
CHLORIDE: 108 mmol/L (ref 101–111)
CO2: 23 mmol/L (ref 22–32)
Calcium: 9.1 mg/dL (ref 8.9–10.3)
Creatinine, Ser: 0.68 mg/dL (ref 0.44–1.00)
Glucose, Bld: 111 mg/dL — ABNORMAL HIGH (ref 65–99)
POTASSIUM: 3.5 mmol/L (ref 3.5–5.1)
SODIUM: 140 mmol/L (ref 135–145)

## 2017-04-05 LAB — URINALYSIS, COMPLETE (UACMP) WITH MICROSCOPIC
Bacteria, UA: NONE SEEN
Bilirubin Urine: NEGATIVE
Glucose, UA: NEGATIVE mg/dL
Hgb urine dipstick: NEGATIVE
Ketones, ur: NEGATIVE mg/dL
Nitrite: NEGATIVE
Protein, ur: NEGATIVE mg/dL
Specific Gravity, Urine: 1.015 (ref 1.005–1.030)
pH: 5 (ref 5.0–8.0)

## 2017-04-05 LAB — TROPONIN I: Troponin I: <0.03 ng/mL (ref <0.03)

## 2017-04-05 LAB — LIPASE, BLOOD: Lipase: 59 U/L — ABNORMAL HIGH (ref 11–51)

## 2017-04-05 MED ORDER — IOPAMIDOL (ISOVUE-370) INJECTION 76%
100.0000 mL | Freq: Once | INTRAVENOUS | Status: AC | PRN
  Administered 2017-04-05: 100 mL via INTRAVENOUS

## 2017-04-05 NOTE — ED Notes (Signed)
EMS at bedside to transport pt home.

## 2017-04-05 NOTE — Discharge Instructions (Signed)
Fortunately today your CT scan was negative and your blood work was reassuring. Please make an appointment to follow-up with her primary care physician on Monday for recheck and return to the emergency department sooner for any new or worsening symptoms such as worsening pain, fevers, chills, or for any other concerns whatsoever.  It was a pleasure to take care of you today, and thank you for coming to our emergency department.  If you have any questions or concerns before leaving please ask the nurse to grab me and I'm more than happy to go through your aftercare instructions again.  If you were prescribed any opioid pain medication today such as Norco, Vicodin, Percocet, morphine, hydrocodone, or oxycodone please make sure you do not drive when you are taking this medication as it can alter your ability to drive safely.  If you have any concerns once you are home that you are not improving or are in fact getting worse before you can make it to your follow-up appointment, please do not hesitate to call 911 and come back for further evaluation.  Darel Hong, MD  Results for orders placed or performed during the hospital encounter of 04/04/17  Lactic acid, plasma  Result Value Ref Range   Lactic Acid, Venous 2.8 (HH) 0.5 - 1.9 mmol/L  Lactic acid, plasma  Result Value Ref Range   Lactic Acid, Venous 2.2 (HH) 0.5 - 1.9 mmol/L  CBC with Differential  Result Value Ref Range   WBC 11.6 (H) 3.6 - 11.0 K/uL   RBC 3.94 3.80 - 5.20 MIL/uL   Hemoglobin 11.7 (L) 12.0 - 16.0 g/dL   HCT 34.5 (L) 35.0 - 47.0 %   MCV 87.5 80.0 - 100.0 fL   MCH 29.7 26.0 - 34.0 pg   MCHC 34.0 32.0 - 36.0 g/dL   RDW 16.0 (H) 11.5 - 14.5 %   Platelets 328 150 - 440 K/uL   Neutrophils Relative % 66 %   Neutro Abs 7.6 (H) 1.4 - 6.5 K/uL   Lymphocytes Relative 17 %   Lymphs Abs 2.0 1.0 - 3.6 K/uL   Monocytes Relative 14 %   Monocytes Absolute 1.6 (H) 0.2 - 0.9 K/uL   Eosinophils Relative 1 %   Eosinophils Absolute  0.1 0 - 0.7 K/uL   Basophils Relative 2 %   Basophils Absolute 0.2 (H) 0 - 0.1 K/uL  Urinalysis, Complete w Microscopic  Result Value Ref Range   Color, Urine YELLOW (A) YELLOW   APPearance CLEAR (A) CLEAR   Specific Gravity, Urine 1.015 1.005 - 1.030   pH 5.0 5.0 - 8.0   Glucose, UA NEGATIVE NEGATIVE mg/dL   Hgb urine dipstick NEGATIVE NEGATIVE   Bilirubin Urine NEGATIVE NEGATIVE   Ketones, ur NEGATIVE NEGATIVE mg/dL   Protein, ur NEGATIVE NEGATIVE mg/dL   Nitrite NEGATIVE NEGATIVE   Leukocytes, UA SMALL (A) NEGATIVE   RBC / HPF 0-5 0 - 5 RBC/hpf   WBC, UA 6-30 0 - 5 WBC/hpf   Bacteria, UA NONE SEEN NONE SEEN   Squamous Epithelial / LPF 0-5 (A) NONE SEEN   Mucous PRESENT   Basic metabolic panel  Result Value Ref Range   Sodium 140 135 - 145 mmol/L   Potassium 3.5 3.5 - 5.1 mmol/L   Chloride 108 101 - 111 mmol/L   CO2 23 22 - 32 mmol/L   Glucose, Bld 111 (H) 65 - 99 mg/dL   BUN 17 6 - 20 mg/dL   Creatinine, Ser 0.68 0.44 -  1.00 mg/dL   Calcium 9.1 8.9 - 10.3 mg/dL   GFR calc non Af Amer >60 >60 mL/min   GFR calc Af Amer >60 >60 mL/min   Anion gap 9 5 - 15  Hepatic function panel  Result Value Ref Range   Total Protein 6.6 6.5 - 8.1 g/dL   Albumin 3.5 3.5 - 5.0 g/dL   AST 22 15 - 41 U/L   ALT 12 (L) 14 - 54 U/L   Alkaline Phosphatase 80 38 - 126 U/L   Total Bilirubin 0.6 0.3 - 1.2 mg/dL   Bilirubin, Direct <0.1 (L) 0.1 - 0.5 mg/dL   Indirect Bilirubin NOT CALCULATED 0.3 - 0.9 mg/dL  Lipase, blood  Result Value Ref Range   Lipase 59 (H) 11 - 51 U/L  Troponin I  Result Value Ref Range   Troponin I <0.03 <0.03 ng/mL  Troponin I  Result Value Ref Range   Troponin I <0.03 <0.03 ng/mL   Dg Chest Port 1 View  Result Date: 04/05/2017 CLINICAL DATA:  Acute onset of epigastric abdominal pain and anorexia. Malaise and generalized weakness. Nausea and diarrhea. Chronic dry cough. Initial encounter. EXAM: PORTABLE CHEST 1 VIEW COMPARISON:  Chest radiograph performed  08/22/2015 FINDINGS: The lungs are well-aerated. Mild bibasilar atelectasis is noted. There is no evidence of pleural effusion or pneumothorax. The cardiomediastinal silhouette is borderline enlarged. No acute osseous abnormalities are seen. IMPRESSION: Borderline cardiomegaly.  Mild bibasilar atelectasis noted. Electronically Signed   By: Garald Balding M.D.   On: 04/05/2017 00:40   Ct Angio Abd/pel W And/or Wo Contrast  Result Date: 04/05/2017 CLINICAL DATA:  83 year old female with epigastric pain. EXAM: CT ABDOMEN AND PELVIS WITH CONTRAST TECHNIQUE: Multidetector CT imaging of the abdomen and pelvis was performed using the standard protocol following bolus administration of intravenous contrast. CONTRAST:  100 cc Isovue 370 COMPARISON:  Abdominal CT dated 02/22/2009 FINDINGS: Lower chest: Bibasilar subpleural scarring. The visualized lung bases are otherwise clear. There is mild cardiomegaly. Coronary vascular calcification primarily involving the LAD. No intra-abdominal free air.  No free fluid. Hepatobiliary: Apparent diffuse fatty infiltration of the liver. No intrahepatic biliary ductal dilatation. The gallbladder is unremarkable. Pancreas: Unremarkable. No pancreatic ductal dilatation or surrounding inflammatory changes. Spleen: Splenectomy. A 15 x 15 mm nodular density in the left upper abdomen the similar to prior CT most likely a a splenium or residual splenic tissue. Adrenals/Urinary Tract: The adrenal glands, kidneys, visualized ureters, and urinary bladder appear unremarkable. There is symmetric enhancement of contrast by both kidneys. Stomach/Bowel: There is extensive colonic diverticulosis without active inflammatory changes. There is a small hiatal hernia. No bowel obstruction. Fluid is noted within the cecum. The appendix is normal. Vascular/Lymphatic: There is moderate atherosclerotic calcification of the abdominal aorta. No aneurysmal dilatation or evidence of dissection. The origins of the  celiac axis, SMA, IMA and the renal arteries are patent. The SMV, and main portal vein are patent. No portal venous gas identified. There is no adenopathy. Reproductive: Large calcified uterine fibroids noted. There is a 1.6 cm left ovarian follicle. Ultrasound may provide better evaluation of the pelvic structures if clinically indicated. Other: Small fat containing umbilical hernia. No inflammatory changes. Musculoskeletal: There is osteopenia with degenerative changes of the spine. No acute fracture. IMPRESSION: 1. Extensive colonic diverticulosis without active inflammatory changes. No bowel obstruction. Normal appendix. 2. Moderate aortoiliac atherosclerotic disease. No portal venous gas or CT evidence of bowel ischemia. Aortic Atherosclerosis (ICD10-I70.0). 3. Prior splenectomy. 4. Calcified uterine fibroid 5.  Cardiomegaly. Electronically Signed   By: Anner Crete M.D.   On: 04/05/2017 02:28

## 2017-04-05 NOTE — ED Notes (Signed)
Reviewed d/c instructions, follow-up care with patient. Pt verbalized understanding.  

## 2017-04-11 DIAGNOSIS — F411 Generalized anxiety disorder: Secondary | ICD-10-CM | POA: Diagnosis not present

## 2017-04-15 ENCOUNTER — Inpatient Hospital Stay (HOSPITAL_COMMUNITY)
Admit: 2017-04-15 | Discharge: 2017-04-15 | Disposition: A | Discharge status: Home / Self Care | Payer: PPO | Attending: Internal Medicine | Admitting: Internal Medicine

## 2017-04-15 ENCOUNTER — Inpatient Hospital Stay
Admission: EM | Admit: 2017-04-15 | Discharge: 2017-04-17 | DRG: 309 | Disposition: A | Discharge status: Home / Self Care | Payer: PPO | Attending: Internal Medicine | Admitting: Internal Medicine

## 2017-04-15 ENCOUNTER — Emergency Department: Payer: PPO

## 2017-04-15 ENCOUNTER — Encounter: Payer: Self-pay | Admitting: Emergency Medicine

## 2017-04-15 DIAGNOSIS — I340 Nonrheumatic mitral (valve) insufficiency: Secondary | ICD-10-CM | POA: Diagnosis not present

## 2017-04-15 DIAGNOSIS — Z9081 Acquired absence of spleen: Secondary | ICD-10-CM

## 2017-04-15 DIAGNOSIS — N179 Acute kidney failure, unspecified: Secondary | ICD-10-CM | POA: Diagnosis not present

## 2017-04-15 DIAGNOSIS — D72829 Elevated white blood cell count, unspecified: Secondary | ICD-10-CM | POA: Diagnosis present

## 2017-04-15 DIAGNOSIS — E872 Acidosis, unspecified: Secondary | ICD-10-CM

## 2017-04-15 DIAGNOSIS — N289 Disorder of kidney and ureter, unspecified: Secondary | ICD-10-CM | POA: Diagnosis not present

## 2017-04-15 DIAGNOSIS — Z9842 Cataract extraction status, left eye: Secondary | ICD-10-CM

## 2017-04-15 DIAGNOSIS — Z7901 Long term (current) use of anticoagulants: Secondary | ICD-10-CM

## 2017-04-15 DIAGNOSIS — Z9841 Cataract extraction status, right eye: Secondary | ICD-10-CM | POA: Diagnosis not present

## 2017-04-15 DIAGNOSIS — R5383 Other fatigue: Secondary | ICD-10-CM | POA: Diagnosis not present

## 2017-04-15 DIAGNOSIS — Z8673 Personal history of transient ischemic attack (TIA), and cerebral infarction without residual deficits: Secondary | ICD-10-CM | POA: Diagnosis not present

## 2017-04-15 DIAGNOSIS — R001 Bradycardia, unspecified: Secondary | ICD-10-CM | POA: Diagnosis not present

## 2017-04-15 DIAGNOSIS — I10 Essential (primary) hypertension: Secondary | ICD-10-CM | POA: Diagnosis not present

## 2017-04-15 DIAGNOSIS — I480 Paroxysmal atrial fibrillation: Secondary | ICD-10-CM | POA: Diagnosis not present

## 2017-04-15 DIAGNOSIS — E039 Hypothyroidism, unspecified: Secondary | ICD-10-CM | POA: Diagnosis not present

## 2017-04-15 DIAGNOSIS — I498 Other specified cardiac arrhythmias: Secondary | ICD-10-CM | POA: Diagnosis not present

## 2017-04-15 DIAGNOSIS — I110 Hypertensive heart disease with heart failure: Secondary | ICD-10-CM | POA: Diagnosis present

## 2017-04-15 DIAGNOSIS — Z853 Personal history of malignant neoplasm of breast: Secondary | ICD-10-CM | POA: Diagnosis not present

## 2017-04-15 DIAGNOSIS — I5032 Chronic diastolic (congestive) heart failure: Secondary | ICD-10-CM | POA: Diagnosis not present

## 2017-04-15 DIAGNOSIS — Z79899 Other long term (current) drug therapy: Secondary | ICD-10-CM | POA: Diagnosis not present

## 2017-04-15 DIAGNOSIS — Z7984 Long term (current) use of oral hypoglycemic drugs: Secondary | ICD-10-CM | POA: Diagnosis not present

## 2017-04-15 DIAGNOSIS — R197 Diarrhea, unspecified: Secondary | ICD-10-CM | POA: Diagnosis present

## 2017-04-15 DIAGNOSIS — F329 Major depressive disorder, single episode, unspecified: Secondary | ICD-10-CM | POA: Diagnosis present

## 2017-04-15 DIAGNOSIS — H409 Unspecified glaucoma: Secondary | ICD-10-CM | POA: Diagnosis present

## 2017-04-15 DIAGNOSIS — D693 Immune thrombocytopenic purpura: Secondary | ICD-10-CM | POA: Diagnosis not present

## 2017-04-15 DIAGNOSIS — I447 Left bundle-branch block, unspecified: Secondary | ICD-10-CM | POA: Diagnosis present

## 2017-04-15 DIAGNOSIS — Z66 Do not resuscitate: Secondary | ICD-10-CM | POA: Diagnosis present

## 2017-04-15 DIAGNOSIS — R531 Weakness: Secondary | ICD-10-CM | POA: Diagnosis not present

## 2017-04-15 DIAGNOSIS — R5381 Other malaise: Secondary | ICD-10-CM | POA: Diagnosis not present

## 2017-04-15 DIAGNOSIS — E785 Hyperlipidemia, unspecified: Secondary | ICD-10-CM | POA: Diagnosis not present

## 2017-04-15 DIAGNOSIS — K30 Functional dyspepsia: Secondary | ICD-10-CM | POA: Diagnosis present

## 2017-04-15 DIAGNOSIS — E1165 Type 2 diabetes mellitus with hyperglycemia: Secondary | ICD-10-CM | POA: Diagnosis present

## 2017-04-15 DIAGNOSIS — R739 Hyperglycemia, unspecified: Secondary | ICD-10-CM

## 2017-04-15 DIAGNOSIS — R0789 Other chest pain: Secondary | ICD-10-CM | POA: Diagnosis not present

## 2017-04-15 HISTORY — DX: Left bundle-branch block, unspecified: I44.7

## 2017-04-15 HISTORY — DX: Paroxysmal atrial fibrillation: I48.0

## 2017-04-15 HISTORY — DX: Chronic diastolic (congestive) heart failure: I50.32

## 2017-04-15 LAB — CBC
HCT: 34.3 % — ABNORMAL LOW (ref 35.0–47.0)
HEMOGLOBIN: 11.2 g/dL — AB (ref 12.0–16.0)
MCH: 28.5 pg (ref 26.0–34.0)
MCHC: 32.7 g/dL (ref 32.0–36.0)
MCV: 87.3 fL (ref 80.0–100.0)
Platelets: 357 10*3/uL (ref 150–440)
RBC: 3.93 MIL/uL (ref 3.80–5.20)
RDW: 16.3 % — AB (ref 11.5–14.5)
WBC: 12.7 10*3/uL — ABNORMAL HIGH (ref 3.6–11.0)

## 2017-04-15 LAB — LACTIC ACID, PLASMA
LACTIC ACID, VENOUS: 4.5 mmol/L — AB (ref 0.5–1.9)
Lactic Acid, Venous: 2.6 mmol/L (ref 0.5–1.9)
Lactic Acid, Venous: 3.4 mmol/L (ref 0.5–1.9)

## 2017-04-15 LAB — URINALYSIS, COMPLETE (UACMP) WITH MICROSCOPIC
BILIRUBIN URINE: NEGATIVE
Bacteria, UA: NONE SEEN
GLUCOSE, UA: NEGATIVE mg/dL
HGB URINE DIPSTICK: NEGATIVE
Ketones, ur: NEGATIVE mg/dL
Nitrite: NEGATIVE
PROTEIN: 30 mg/dL — AB
Specific Gravity, Urine: 1.018 (ref 1.005–1.030)
pH: 5 (ref 5.0–8.0)

## 2017-04-15 LAB — BASIC METABOLIC PANEL
ANION GAP: 11 (ref 5–15)
BUN: 29 mg/dL — AB (ref 6–20)
CALCIUM: 9.3 mg/dL (ref 8.9–10.3)
CO2: 21 mmol/L — AB (ref 22–32)
Chloride: 105 mmol/L (ref 101–111)
Creatinine, Ser: 1.31 mg/dL — ABNORMAL HIGH (ref 0.44–1.00)
GFR calc Af Amer: 41 mL/min — ABNORMAL LOW (ref >60)
GFR, EST NON AFRICAN AMERICAN: 35 mL/min — AB (ref >60)
GLUCOSE: 151 mg/dL — AB (ref 65–99)
Potassium: 4.4 mmol/L (ref 3.5–5.1)
Sodium: 137 mmol/L (ref 135–145)

## 2017-04-15 LAB — MAGNESIUM: Magnesium: 1.6 mg/dL — ABNORMAL LOW (ref 1.7–2.4)

## 2017-04-15 LAB — APTT: APTT: 36 s (ref 24–36)

## 2017-04-15 LAB — HEPARIN LEVEL (UNFRACTIONATED)

## 2017-04-15 LAB — DIGOXIN LEVEL: Digoxin Level: <0.2 ng/mL — ABNORMAL LOW (ref 0.8–2.0)

## 2017-04-15 LAB — TROPONIN I: Troponin I: <0.03 ng/mL (ref <0.03)

## 2017-04-15 LAB — PROTIME-INR
INR: 1.67
PROTHROMBIN TIME: 19.9 s — AB (ref 11.4–15.2)

## 2017-04-15 LAB — TSH: TSH: 2.828 u[IU]/mL (ref 0.350–4.500)

## 2017-04-15 MED ORDER — SODIUM CHLORIDE 0.9 % IV SOLN
INTRAVENOUS | Status: DC
  Administered 2017-04-15 (×2): via INTRAVENOUS

## 2017-04-15 MED ORDER — SIMVASTATIN 20 MG PO TABS
20.0000 mg | ORAL_TABLET | Freq: Every day | ORAL | Status: DC
  Administered 2017-04-15 – 2017-04-16 (×2): 20 mg via ORAL
  Filled 2017-04-15: qty 2
  Filled 2017-04-15 (×2): qty 1

## 2017-04-15 MED ORDER — HEPARIN (PORCINE) IN NACL 100-0.45 UNIT/ML-% IJ SOLN
1000.0000 [IU]/h | INTRAMUSCULAR | Status: DC
  Administered 2017-04-15: 1000 [IU]/h via INTRAVENOUS
  Filled 2017-04-15: qty 250

## 2017-04-15 MED ORDER — ONDANSETRON HCL 4 MG/2ML IJ SOLN: 4.0000 mg | Freq: Four times a day (QID) | INTRAMUSCULAR | Status: DC | PRN

## 2017-04-15 MED ORDER — SODIUM CHLORIDE 0.9 % IV BOLUS (SEPSIS): 1000.0000 mL | Freq: Once | INTRAVENOUS | Status: DC

## 2017-04-15 MED ORDER — SODIUM CHLORIDE 0.9 % IV BOLUS (SEPSIS)
1000.0000 mL | Freq: Once | INTRAVENOUS | Status: AC
  Administered 2017-04-15: 1000 mL via INTRAVENOUS

## 2017-04-15 MED ORDER — ONDANSETRON HCL 4 MG PO TABS: 4.0000 mg | ORAL_TABLET | Freq: Four times a day (QID) | ORAL | Status: DC | PRN

## 2017-04-15 MED ORDER — SODIUM CHLORIDE 0.9 % IV SOLN: 250.0000 mL | INTRAVENOUS | Status: DC | PRN

## 2017-04-15 MED ORDER — DOCUSATE SODIUM 100 MG PO CAPS
100.0000 mg | ORAL_CAPSULE | Freq: Two times a day (BID) | ORAL | Status: DC
  Administered 2017-04-15 – 2017-04-17 (×3): 100 mg via ORAL
  Filled 2017-04-15 (×4): qty 1

## 2017-04-15 MED ORDER — APIXABAN 5 MG PO TABS: 5.0000 mg | ORAL_TABLET | Freq: Two times a day (BID) | ORAL | Status: DC

## 2017-04-15 MED ORDER — SODIUM CHLORIDE 0.9% FLUSH
3.0000 mL | Freq: Two times a day (BID) | INTRAVENOUS | Status: DC
  Administered 2017-04-15 – 2017-04-17 (×3): 3 mL via INTRAVENOUS

## 2017-04-15 MED ORDER — ACETAMINOPHEN 325 MG PO TABS: 650.0000 mg | ORAL_TABLET | Freq: Four times a day (QID) | ORAL | Status: DC | PRN

## 2017-04-15 MED ORDER — SODIUM CHLORIDE 0.9% FLUSH
3.0000 mL | INTRAVENOUS | Status: DC | PRN
  Administered 2017-04-17: 3 mL via INTRAVENOUS
  Filled 2017-04-15: qty 3

## 2017-04-15 MED ORDER — ACETAMINOPHEN 650 MG RE SUPP: 650.0000 mg | Freq: Four times a day (QID) | RECTAL | Status: DC | PRN

## 2017-04-15 MED ORDER — ZOLPIDEM TARTRATE 5 MG PO TABS: 5.0000 mg | ORAL_TABLET | Freq: Every evening | ORAL | Status: DC | PRN

## 2017-04-15 MED ORDER — LEVOTHYROXINE SODIUM 112 MCG PO TABS
112.0000 ug | ORAL_TABLET | Freq: Every day | ORAL | Status: DC
  Administered 2017-04-16 – 2017-04-17 (×2): 112 ug via ORAL
  Filled 2017-04-15 (×2): qty 1

## 2017-04-15 MED ORDER — MAGNESIUM SULFATE 2 GM/50ML IV SOLN
2.0000 g | Freq: Once | INTRAVENOUS | Status: AC
  Administered 2017-04-15: 2 g via INTRAVENOUS
  Filled 2017-04-15: qty 50

## 2017-04-15 MED ORDER — VITAMIN B-12 1000 MCG PO TABS
1000.0000 ug | ORAL_TABLET | Freq: Every day | ORAL | Status: DC
  Administered 2017-04-16 – 2017-04-17 (×2): 1000 ug via ORAL
  Filled 2017-04-15 (×2): qty 1

## 2017-04-15 MED ORDER — TIMOLOL MALEATE 0.5 % OP SOLN
1.0000 [drp] | Freq: Every day | OPHTHALMIC | Status: DC
  Filled 2017-04-15: qty 5

## 2017-04-15 NOTE — H&P (Signed)
[facility] [place] NAME: Rhonda Hurley    MR#:  [id number]  DATE OF BIRTH:  1926/12/25  DATE OF ADMISSION:  04/15/2017  PRIMARY CARE PHYSICIAN: Juluis Pitch, MD   REQUESTING/REFERRING PHYSICIAN:   CHIEF COMPLAINT:   Chief Complaint  Patient presents with  . Bradycardia    HISTORY OF PRESENT ILLNESS: Rhonda Hurley  is a 83 y.o. female with a known history of Diabetes mellitus, glaucoma, breast cancer, ITP, hyperlipidemia, hypertension, hypothyroidism, paroxysmal atrial fibrillation, recent diagnosis of depression, who presents to the hospital with complaints of lightheadedness, weakness, just not feeling well, which happened today after she took antidepressant Lexapro for the first time. She admitted to feeling chest tightness, which was described as intermittent midsternal chest discomfort, improved with walking, she also felt short of breath, nauseated, lightheaded and weak. She presented emergency room where she was noted to have a heart rate from 33-38, junctional bradycardia on EKG. Hospitalist services was contacted for admission. The patient was admitted to emergency room a few days ago with epigastric abdominal pain, diarrhea. Today she was noted to have mild renal insufficiency. Patient's daughter tells me that the patient's oral intake did not improve yet.   PAST MEDICAL HISTORY:   Past Medical History:  Diagnosis Date  . Acute cerebrovascular accident [facility])    a. 10/2012: MRI concern for posible small acute infarct along the left parietal cortex  . BP (high blood pressure) 04/08/2014  . Chronic diastolic CHF (congestive heart failure) ([facility])    a. 03/2007 Echo: nl EF, mod MR,mild TR, Diast dysfxn; b. echo 10/2012: EF 50-55%, possible HK of anterior & anteroseptal wall, mild to mod LVH, nl RVSP.  Marland Kitchen Derangement of posterior horn of medial meniscus   . Essential hypertension, benign   . Mitral valve disorders(424.0)    a. echo 03/2007: nl LVEF, mod MR, mmild TR, DD; b. echo 10/2012: EF 50-55%, possible HK of anterior & anteroseptal wall, mild to mod LVH, nl RVSP  . Other and unspecified hyperlipidemia   . Paroxysmal atrial fibrillation ([facility])    a. CHA2DS2VASc = 8-->eliquis;  b. Managed w/ flecainide and BB therapy.  Marland Kitchen TIA (transient ischemic attack) 2014  . Type II or unspecified type diabetes mellitus without mention of complication, not stated as uncontrolled   . Unspecified hypothyroidism     PAST SURGICAL HISTORY: Past Surgical History:  Procedure Laterality Date  . BREAST LUMPECTOMY  12/1996   left   . CATARACT EXTRACTION  2000   left  . CATARACT EXTRACTION  11/2010   right  . ELECTROPHYSIOLOGIC STUDY N/A 06/13/2015   Procedure: Cardioversion;  Surgeon: Minna Merritts, MD;  Location: [facility] ORS;  Service: Cardiovascular;  Laterality: N/A;  . radioactive ablation   1999   thyroid glan  . SPLENECTOMY      SOCIAL HISTORY:  Social History  Substance Use Topics  . Smoking status: Never Smoker  . Smokeless tobacco: Never Used  . Alcohol use No    FAMILY HISTORY:  Family History  Problem Relation Age of Onset  . Bladder Cancer Neg Hx   . Kidney cancer Neg Hx     DRUG ALLERGIES: No Known Allergies  Review of Systems  Constitutional: Positive for malaise/fatigue. Negative for chills, fever and weight loss.  HENT: Negative for congestion.   Eyes: Positive for blurred vision. Negative for double vision.  Respiratory: Positive for shortness of breath. Negative for cough, sputum production and wheezing.  Cardiovascular: Positive for chest pain and leg swelling. Negative for palpitations, orthopnea and PND.  Gastrointestinal: Positive for abdominal pain, diarrhea and nausea. Negative for blood in stool, constipation and vomiting.  Genitourinary: Negative for dysuria, frequency, hematuria and urgency.  Musculoskeletal: Negative for falls.  Neurological: Positive for dizziness and weakness.  Negative for tremors, focal weakness and headaches.  Endo/Heme/Allergies: Does not bruise/bleed easily.  Psychiatric/Behavioral: Negative for depression. The patient does not have insomnia.     MEDICATIONS AT HOME:  Prior to Admission medications   Medication Sig Start Date End Date Taking? Authorizing Provider  amLODipine (NORVASC) 10 MG tablet Take 1 tablet (10 mg total) by mouth daily. 07/11/16  Yes Minna Merritts, MD  Cholecalciferol (VITAMIN D3) 1000 UNITS CAPS Take 1,000 Units by mouth daily.    Yes [provider]  ELIQUIS 5 MG TABS tablet TAKE 1 TABLET (5 MG TOTAL) BY MOUTH 2 (TWO) TIMES DAILY. 01/28/17  Yes Gollan, Kathlene November, MD  escitalopram (LEXAPRO) 5 MG tablet Take 5 mg by mouth daily. 04/14/17  Yes [provider]  flecainide (TAMBOCOR) 100 MG tablet TAKE 1 TABLET (100 MG TOTAL) BY MOUTH 2 (TWO) TIMES DAILY. 12/23/16  Yes Gollan, Kathlene November, MD  levothyroxine (SYNTHROID, LEVOTHROID) 112 MCG tablet Take 112 mcg by mouth every morning. 01/07/17  Yes [provider]  metFORMIN (GLUCOPHAGE) 500 MG tablet Take 1,000 mg by mouth 2 (two) times daily. 07/11/15  Yes [provider]  metoprolol tartrate (LOPRESSOR) 25 MG tablet Take 25 mg by mouth 2 (two) times daily.   Yes [provider]  ramipril (ALTACE) 10 MG capsule TAKE 1 CAPSULE (10 MG TOTAL) BY MOUTH DAILY. 12/09/16  Yes Gollan, Kathlene November, MD  simvastatin (ZOCOR) 20 MG tablet Take 20 mg by mouth at bedtime.     Yes [provider]  vitamin B-12 (CYANOCOBALAMIN) 1000 MCG tablet Take 1,000 mcg by mouth daily.   Yes [provider]  zolpidem (AMBIEN) 5 MG tablet Take 2.5-5 mg by mouth at bedtime as needed for sleep. 04/11/17  Yes [provider]  timolol (TIMOPTIC) 0.5 % ophthalmic solution 1 drop daily.     [provider]      PHYSICAL EXAMINATION:   VITAL SIGNS: Blood pressure 110/62, pulse (!) 38, temperature 98.5 F (36.9 C), temperature source Oral,  resp. rate (!) 21, height 5\' 5"  (1.651 m), weight 77.1 kg (170 lb), SpO2 93 %.  GENERAL:  83 y.o.-year-old patient lying in the bed with no acute distress.  EYES: Pupils equal, round, reactive to light and accommodation. No scleral icterus. Extraocular muscles intact.  HEENT: Head atraumatic, normocephalic. Oropharynx and nasopharynx clear.  NECK:  Supple, 7 cm jugular venous distention. No thyroid enlargement, no tenderness.  LUNGS: Normal breath sounds bilaterally, no wheezing, rales,rhonchi or crepitation. No use of accessory muscles of respiration.  CARDIOVASCULAR: S1, S2 , rhythm is regular, bradycardic. No murmurs, rubs, or gallops.  ABDOMEN: Soft, nontender, nondistended. Bowel sounds present. No organomegaly or mass.  EXTREMITIES: No pedal edema, cyanosis, or clubbing.  NEUROLOGIC: Cranial nerves II through XII are intact. Muscle strength 5/5 in all extremities. Sensation intact. Gait not checked.  PSYCHIATRIC: The patient is alert and oriented x 3.  SKIN: No obvious rash, lesion, or ulcer.   LABORATORY PANEL:   CBC  Recent Labs Lab 04/15/17 1320  WBC 12.7*  HGB 11.2*  HCT 34.3*  PLT 357  MCV 87.3  MCH 28.5  MCHC 32.7  RDW 16.3*   ------------------------------------------------------------------------------------------------------------------  Chemistries   Recent Labs Lab 04/15/17 1320  NA 137  K 4.4  CL 105  CO2 21*  GLUCOSE 151*  BUN 29*  CREATININE 1.31*  CALCIUM 9.3  MG 1.6*   ------------------------------------------------------------------------------------------------------------------  Cardiac Enzymes  Recent Labs Lab 04/15/17 1320  TROPONINI <0.03   ------------------------------------------------------------------------------------------------------------------  RADIOLOGY: Dg Chest 2 View  Result Date: 04/15/2017 CLINICAL DATA:  Weakness and chest tightness EXAM: CHEST  2 VIEW COMPARISON:  04/04/2017 FINDINGS: Cardiac shadow is again  enlarged. Aortic calcifications are seen. The lungs are well aerated bilaterally. No focal infiltrate or sizable effusion is seen. Degenerative changes of the thoracic spine are noted. IMPRESSION: No acute abnormality noted. Electronically Signed   By: Inez Catalina M.D.   On: 04/15/2017 14:09    EKG: Orders placed or performed during the hospital encounter of 04/15/17  . EKG 12-Lead  . EKG 12-Lead  . ED EKG within 10 minutes  . ED EKG within 10 minutes  . EKG 12-Lead  . EKG 12-Lead  EKG in emergency room showed junctional rhythm at the rate of 39 bpm, left bundle branch block  IMPRESSION AND PLAN:  Active Problems:   AV junctional bradycardia   Generalized weakness   Acute renal insufficiency   Hyperglycemia   Lactic acidosis  #1. AV junctional bradycardia. Get  cardiologist consultation, stop Flecainide and Toprol, follow closely clinically, check TSH #2. Generalized weakness, likely due to junctional bradycardia, getting physical therapist involved for further recommendations #3. Acute renal insufficiency, likely due to recent diarrhea, check urinalysis, initiate patient on low rate IV fluids, reassess creatinine in the morning, watch for fluid overload #4. Lactic acidosis, continue IV fluids, follow lactic acid level, likely due to poor peripheral perfusion, recent diarrhea/volume depletion #5. Leukocytosis, check urinalysis #6. History of essential hypertension, patient's blood pressure is low, holding all blood pressure medications, low rate IV fluids, watch for fluid overload in the setting of severe bradycardia  All the records are reviewed and case discussed with ED provider. Management plans discussed with the patient, family and they are in agreement.  CODE STATUS: Code Status History    This patient does not have a recorded code status. Please follow your organizational policy for patients in this situation.    Advance Directive Documentation     Most Recent Value    Type of Advance Directive  Healthcare Power of Attorney, Living will, Out of facility DNR (pink MOST or yellow form)  Pre-existing out of facility DNR order (yellow form or pink MOST form)  Yellow form placed in chart (order not valid for inpatient use)  "MOST" Form in Place?  -       TOTAL TIME TAKING CARE OF THIS PATIENT: 60 minutes.    Theodoro Grist M.D on 04/15/2017 at 2:45 PM  Between 7am to 6pm - Pager - [phone] After 6pm go to [email or web address] - password [email or web address] [facility]  Office  [phone]  CC: Primary care physician; Juluis Pitch, MD

## 2017-04-15 NOTE — Consult Note (Signed)
Cardiology Consult    Patient ID: [name] MRN: [id number], DOB/AGE: [birth year]   Admit date: 04/15/2017 Date of Consult: 04/15/2017  Primary Physician: Juluis Pitch, MD Primary Cardiologist: Johnny Bridge, MD  Requesting Provider: C. Alfred Levins     Patient Profile    Rhonda Hurley is a 83 y.o. female with a history of PAF, HTN, HL, hypothyroidism, TIA/CVA, and valvular heart dzs, who is being seen today for the evaluation of bradycardia and chest pain at the request of Dr. Alfred Levins.  Past Medical History   Past Medical History:  Diagnosis Date  . Acute cerebrovascular accident [facility])    a. 10/2012: MRI concern for posible small acute infarct along the left parietal cortex  . BP (high blood pressure) 04/08/2014  . Chronic diastolic CHF (congestive heart failure) ([place])    a. 03/2007 Echo: nl EF, mod MR,mild TR, Diast dysfxn; b. echo 10/2012: EF 50-55%, possible HK of anterior & anteroseptal wall, mild to mod LVH, nl RVSP.  Marland Kitchen Derangement of posterior horn of medial meniscus   . Essential hypertension, benign   . Mitral valve disorders(424.0)    a. echo 03/2007: nl LVEF, mod MR, mmild TR, DD; b. echo 10/2012: EF 50-55%, possible HK of anterior & anteroseptal wall, mild to mod LVH, nl RVSP  . Other and unspecified hyperlipidemia   . Paroxysmal atrial fibrillation (HCC)    a. CHA2DS2VASc = 8-->eliquis;  b. Managed w/ flecainide and BB therapy.  Marland Kitchen TIA (transient ischemic attack) 2014  . Type II or unspecified type diabetes mellitus without mention of complication, not stated as uncontrolled   . Unspecified hypothyroidism     Past Surgical History:  Procedure Laterality Date  . BREAST LUMPECTOMY  12/1996   left   . CATARACT EXTRACTION  2000   left  . CATARACT EXTRACTION  11/2010   right  . ELECTROPHYSIOLOGIC STUDY N/A 06/13/2015   Procedure: Cardioversion;  Surgeon: Minna Merritts, MD;  Location: [facility] ORS;  Service: Cardiovascular;  Laterality: N/A;  . radioactive ablation    1999   thyroid glan  . SPLENECTOMY       Allergies  No Known Allergies  History of Present Illness   83 y.o. female with a history of PAF, HTN, HL, hypothyroidism, TIA/CVA, and valvular heart dzs.  She is s/p prior DCCV and has maintained sinus rhythm on flecainide and  blocker therapy.  She is chronically anticoagulated with eliquis.  Earlier this month, she was seen in the ED with abd discomfort.  CT was negative for acute changes and troponin was nl.  She was subsequently d/c'd home.  Since then, she says that she has done well.  She lives in independent living and is compliant with her meds.  This AM, after taking her meds and eating bfast, she felt strange with retrosternal chest tightness and wkns.  She took a medication that Dr. Rockey Situ apparently gave her many years ago for palpitations.  She keeps it in a separate bottle and has never taken it before but decided to take it today b/c she felt strange.  The medication starts with "AM."  She doesn't think it is her amlodipine.  Regardless, she continued to have chest tightness and after about 5 hrs, she alerted staff and EMS was called.  She was found to be bradycardic and taken to the [facility] ED for eval.  Here, she was found to be in junctional bradycardia.  Hemodynamically stable.  She currently denies chest pain.  Home Medications  Prior to Admission medications   Medication Sig Start Date End Date Taking? Authorizing Provider  amLODipine (NORVASC) 10 MG tablet Take 1 tablet (10 mg total) by mouth daily. 07/11/16  Yes Minna Merritts, MD  Cholecalciferol (VITAMIN D3) 1000 UNITS CAPS Take 1,000 Units by mouth daily.    Yes [provider]  ELIQUIS 5 MG TABS tablet TAKE 1 TABLET (5 MG TOTAL) BY MOUTH 2 (TWO) TIMES DAILY. 01/28/17  Yes Gollan, Kathlene November, MD  escitalopram (LEXAPRO) 5 MG tablet Take 5 mg by mouth daily. 04/14/17  Yes [provider]  flecainide (TAMBOCOR) 100 MG tablet TAKE 1 TABLET (100 MG TOTAL) BY  MOUTH 2 (TWO) TIMES DAILY. 12/23/16  Yes Gollan, Kathlene November, MD  levothyroxine (SYNTHROID, LEVOTHROID) 112 MCG tablet Take 112 mcg by mouth every morning. 01/07/17  Yes [provider]  metFORMIN (GLUCOPHAGE) 500 MG tablet Take 1,000 mg by mouth 2 (two) times daily. 07/11/15  Yes [provider]  metoprolol tartrate (LOPRESSOR) 25 MG tablet Take 25 mg by mouth 2 (two) times daily.   Yes [provider]  ramipril (ALTACE) 10 MG capsule TAKE 1 CAPSULE (10 MG TOTAL) BY MOUTH DAILY. 12/09/16  Yes Gollan, Kathlene November, MD  simvastatin (ZOCOR) 20 MG tablet Take 20 mg by mouth at bedtime.     Yes [provider]  vitamin B-12 (CYANOCOBALAMIN) 1000 MCG tablet Take 1,000 mcg by mouth daily.   Yes [provider]  zolpidem (AMBIEN) 5 MG tablet Take 2.5-5 mg by mouth at bedtime as needed for sleep. 04/11/17  Yes [provider]  timolol (TIMOPTIC) 0.5 % ophthalmic solution 1 drop daily.     [provider]    Family History    Family History  Problem Relation Age of Onset  . Bladder Cancer Neg Hx   . Kidney cancer Neg Hx     Social History    Social History   Social History  . Marital status: Married    Spouse name: N/A  . Number of children: N/A  . Years of education: N/A   Occupational History  . Not on file.   Social History Main Topics  . Smoking status: Never Smoker  . Smokeless tobacco: Never Used  . Alcohol use No  . Drug use: No  . Sexual activity: Not on file   Other Topics Concern  . Not on file   Social History Narrative  . No narrative on file     Review of Systems    General:  No chills, fever, night sweats or weight changes.  Cardiovascular:  +++ chest pain/tightness.  No dyspnea on exertion, edema, orthopnea, palpitations, paroxysmal nocturnal dyspnea. Dermatological: No rash, lesions/masses Respiratory: No cough, dyspnea Urologic: No hematuria, dysuria Abdominal:   No nausea, vomiting, diarrhea, bright red  blood per rectum, melena, or hematemesis Neurologic:  No visual changes, +++ wkns, no changes in mental status. All other systems reviewed and are otherwise negative except as noted above.  Physical Exam    Blood pressure (!) 113/58, pulse (!) 36, temperature 98.5 F (36.9 C), temperature source Oral, resp. rate 14, height 5\' 5"  (1.651 m), weight 170 lb (77.1 kg), SpO2 97 %.  General: Pleasant, NAD Psych: Normal affect. Neuro: Alert and oriented X 3. Moves all extremities spontaneously. HEENT: Normal  Neck: Supple without bruits or JVD. Lungs:  Resp regular and unlabored, CTA. Heart: RRR, brady, no s3, s4, or murmurs. Abdomen: Soft, non-tender, non-distended, BS + x 4.  Extremities:  No clubbing, cyanosis or edema. DP/PT/Radials 2+ and equal bilaterally.  Labs     Recent Labs  04/15/17 1320  TROPONINI <0.03   Lab Results  Component Value Date   WBC 12.7 (H) 04/15/2017   HGB 11.2 (L) 04/15/2017   HCT 34.3 (L) 04/15/2017   MCV 87.3 04/15/2017   PLT 357 04/15/2017    Recent Labs Lab 04/15/17 1320  NA 137  K 4.4  CL 105  CO2 21*  BUN 29*  CREATININE 1.31*  CALCIUM 9.3  GLUCOSE 151*    Radiology Studies    Dg Chest 2 View  Result Date: 04/15/2017 CLINICAL DATA:  Weakness and chest tightness EXAM: CHEST  2 VIEW COMPARISON:  04/04/2017 FINDINGS: Cardiac shadow is again enlarged. Aortic calcifications are seen. The lungs are well aerated bilaterally. No focal infiltrate or sizable effusion is seen. Degenerative changes of the thoracic spine are noted. IMPRESSION: No acute abnormality noted. Electronically Signed   By: Inez Catalina M.D.   On: 04/15/2017 14:09   Dg Chest Port 1 View  Result Date: 04/05/2017 CLINICAL DATA:  Acute onset of epigastric abdominal pain and anorexia. Malaise and generalized weakness. Nausea and diarrhea. Chronic dry cough. Initial encounter. EXAM: PORTABLE CHEST 1 VIEW COMPARISON:  Chest radiograph performed 08/22/2015 FINDINGS: The lungs are  well-aerated. Mild bibasilar atelectasis is noted. There is no evidence of pleural effusion or pneumothorax. The cardiomediastinal silhouette is borderline enlarged. No acute osseous abnormalities are seen. IMPRESSION: Borderline cardiomegaly.  Mild bibasilar atelectasis noted. Electronically Signed   By: Garald Balding M.D.   On: 04/05/2017 00:40   Ct Angio Abd/pel W And/or Wo Contrast  Result Date: 04/05/2017 CLINICAL DATA:  83 year old female with epigastric pain. EXAM: CT ABDOMEN AND PELVIS WITH CONTRAST TECHNIQUE: Multidetector CT imaging of the abdomen and pelvis was performed using the standard protocol following bolus administration of intravenous contrast. CONTRAST:  100 cc Isovue 370 COMPARISON:  Abdominal CT dated 02/22/2009 FINDINGS: Lower chest: Bibasilar subpleural scarring. The visualized lung bases are otherwise clear. There is mild cardiomegaly. Coronary vascular calcification primarily involving the LAD. No intra-abdominal free air.  No free fluid. Hepatobiliary: Apparent diffuse fatty infiltration of the liver. No intrahepatic biliary ductal dilatation. The gallbladder is unremarkable. Pancreas: Unremarkable. No pancreatic ductal dilatation or surrounding inflammatory changes. Spleen: Splenectomy. A 15 x 15 mm nodular density in the left upper abdomen the similar to prior CT most likely a a splenium or residual splenic tissue. Adrenals/Urinary Tract: The adrenal glands, kidneys, visualized ureters, and urinary bladder appear unremarkable. There is symmetric enhancement of contrast by both kidneys. Stomach/Bowel: There is extensive colonic diverticulosis without active inflammatory changes. There is a small hiatal hernia. No bowel obstruction. Fluid is noted within the cecum. The appendix is normal. Vascular/Lymphatic: There is moderate atherosclerotic calcification of the abdominal aorta. No aneurysmal dilatation or evidence of dissection. The origins of the celiac axis, SMA, IMA and the renal  arteries are patent. The SMV, and main portal vein are patent. No portal venous gas identified. There is no adenopathy. Reproductive: Large calcified uterine fibroids noted. There is a 1.6 cm left ovarian follicle. Ultrasound may provide better evaluation of the pelvic structures if clinically indicated. Other: Small fat containing umbilical hernia. No inflammatory changes. Musculoskeletal: There is osteopenia with degenerative changes of the spine. No acute fracture. IMPRESSION: 1. Extensive colonic diverticulosis without active inflammatory changes. No bowel obstruction. Normal appendix. 2. Moderate aortoiliac atherosclerotic disease. No portal venous gas or CT evidence of bowel ischemia. Aortic Atherosclerosis (  ICD10-I70.0). 3. Prior splenectomy. 4. Calcified uterine fibroid 5. Cardiomegaly. Electronically Signed   By: Anner Crete M.D.   On: 04/05/2017 02:28    ECG & Cardiac Imaging    Jxnl rhythm, 39, LBBB, no acute ST/T changes.  Assessment & Plan    1.  Junctional bradycardia:  Hold flecainide, oral  blocker, and timolol gtts.  She thinks it's possible that she took a medicine that was prev Rx for palpitations that starts with AM.  I have reviewed her past Rx and can see that she is on amlodipine but that is all.  I fear that perhaps she has an old bottle of amiodarone.  Her dtr is going to find the bottle and call the nsg staff with the name.  If HRs do not recover with holding of meds and she does not show chronotropic competence w/in the next 24-48 hrs, we will need to have EP eval for pacer.  Check echo and TSH.  2.  PAF:  Will have to watch for PAF while AAD and  blocker on hold.  Cont eliquis.  With chronic LBBB and evidence of LAD Ca2+, flecainide is likely not the best drug for her going forward.  3.  Essential HTN:  Stable.   blocker, ACEI, and Amlodipine on hold for now.  Follow.  4.  HL:  Cont statin rx.  5.  Hypothyroidism:  Check tsh in setting of bradycardia.  6.  Chest  pain:  No h/o CAD.  Recent CT abd shoed incidental coronary Ca2+, primarily involving the LAD.  Trop nl so far.  Will need ischemic eval - may be able to be done as outpt if r/o and HRs recover.  Signed, Murray Hodgkins, NP 04/15/2017, 2:22 PM

## 2017-04-15 NOTE — ED Notes (Signed)
Admitting at bedside 

## 2017-04-15 NOTE — Progress Notes (Signed)
md vaickute was notified of lactic acis 2.6.

## 2017-04-15 NOTE — ED Notes (Signed)
Pt returned from xray

## 2017-04-15 NOTE — Progress Notes (Signed)
Notified MD of lactic acid of 4.2. Orders placed. Will continue to monitor and assess.

## 2017-04-15 NOTE — Progress Notes (Addendum)
ANTICOAGULATION CONSULT NOTE - Initial Consult  Pharmacy Consult for Heparin Indication: atrial fibrillation  No Known Allergies  Patient Measurements: Height: 5\' 5"  (165.1 cm) Weight: 170 lb (77.1 kg) IBW/kg (Calculated) : 57 Heparin Dosing Weight: 73 kg  Vital Signs: Temp: 98.1 F (36.7 C) (08/14 1608) Temp Source: Oral (08/14 1316) BP: 118/45 (08/14 1608) Pulse Rate: 37 (08/14 1608)  Labs:  Recent Labs  04/15/17 1320 04/15/17 1606  HGB 11.2*  --   HCT 34.3*  --   PLT 357  --   LABPROT 19.9*  --   INR 1.67  --   CREATININE 1.31*  --   TROPONINI <0.03 <0.03    Estimated Creatinine Clearance: 30.5 mL/min (A) (by C-G formula based on SCr of 1.31 mg/dL (H)).   Medical History: Past Medical History:  Diagnosis Date  . Acute cerebrovascular accident [facility])    a. 10/2012: MRI concern for posible small acute infarct along the left parietal cortex  . BP (high blood pressure) 04/08/2014  . Chronic diastolic CHF (congestive heart failure) ([place])    a. 03/2007 Echo: nl EF, mod MR,mild TR, Diast dysfxn; b. echo 10/2012: EF 50-55%, possible HK of anterior & anteroseptal wall, mild to mod LVH, nl RVSP.  Marland Kitchen Derangement of posterior horn of medial meniscus   . Essential hypertension, benign   . LBBB (left bundle branch block)   . Mitral valve disorders(424.0)    a. echo 03/2007: nl LVEF, mod MR, mmild TR, DD; b. echo 10/2012: EF 50-55%, possible HK of anterior & anteroseptal wall, mild to mod LVH, nl RVSP  . Other and unspecified hyperlipidemia   . Paroxysmal atrial fibrillation (HCC)    a. CHA2DS2VASc = 8-->eliquis;  b. Managed w/ flecainide and BB therapy.  Marland Kitchen TIA (transient ischemic attack) 2014  . Type II or unspecified type diabetes mellitus without mention of complication, not stated as uncontrolled   . Unspecified hypothyroidism     Assessment: 83 yo female on apixaban PTA to transition to heparin drip. Called pt who stated last dose of Eliquis was between 6-7 am this  morning.  Cardiologist order states to begin [heparin] in place of apixaban (next dose would be due at 2200). Text paged MD FYI about starting heparin drip at 1900 (~12h since last apixaban dose).  Hgb 11.2, Plt 357, INR 1.67  Goal of Therapy:  Heparin level 0.3-0.7 units/ml Monitor platelets by anticoagulation protocol: Yes   Plan:  STAT APTT and heparin level ordered as baseline labs Start heparin drip with no bolus at 1000 units/hr (=10 ml/hr) at 1900. Informed RN of plan to get labs before starting heparin drip and starting heparin drip at 1900. Stated she would inform the night shift RN.  APTT level and CBC for tomorrow ordered  Pharmacy will continue to follow.   Rocky Morel 04/15/2017,6:08 PM

## 2017-04-15 NOTE — ED Triage Notes (Signed)
Pt to ED via EMS from [place] c/o bradycardia and chest tightness.  Patient states felt fine waking up this morning and took morning meds including two new medications, escitalopram and zolpidem tartrate, and felt weak tight chest.  Denies pain or SOB.  Presents A&Ox4, speaking in complete and coherent sentences, chest rise even and unlabored, skin warm and dry.

## 2017-04-16 DIAGNOSIS — E872 Acidosis: Secondary | ICD-10-CM

## 2017-04-16 DIAGNOSIS — R0789 Other chest pain: Secondary | ICD-10-CM

## 2017-04-16 LAB — ECHOCARDIOGRAM COMPLETE
Height: 65 in
WEIGHTICAEL: 2720 [oz_av]

## 2017-04-16 LAB — BASIC METABOLIC PANEL
ANION GAP: 8 (ref 5–15)
BUN: 28 mg/dL — ABNORMAL HIGH (ref 6–20)
CALCIUM: 8.9 mg/dL (ref 8.9–10.3)
CO2: 21 mmol/L — ABNORMAL LOW (ref 22–32)
CREATININE: 1.01 mg/dL — AB (ref 0.44–1.00)
Chloride: 107 mmol/L (ref 101–111)
GFR, EST AFRICAN AMERICAN: 56 mL/min — AB (ref >60)
GFR, EST NON AFRICAN AMERICAN: 48 mL/min — AB (ref >60)
Glucose, Bld: 159 mg/dL — ABNORMAL HIGH (ref 65–99)
Potassium: 4.1 mmol/L (ref 3.5–5.1)
SODIUM: 136 mmol/L (ref 135–145)

## 2017-04-16 LAB — CBC
HCT: 33.2 % — ABNORMAL LOW (ref 35.0–47.0)
HEMOGLOBIN: 10.8 g/dL — AB (ref 12.0–16.0)
MCH: 28.4 pg (ref 26.0–34.0)
MCHC: 32.7 g/dL (ref 32.0–36.0)
MCV: 87.1 fL (ref 80.0–100.0)
PLATELETS: 342 10*3/uL (ref 150–440)
RBC: 3.81 MIL/uL (ref 3.80–5.20)
RDW: 16.2 % — ABNORMAL HIGH (ref 11.5–14.5)
WBC: 14.1 10*3/uL — ABNORMAL HIGH (ref 3.6–11.0)

## 2017-04-16 LAB — GLUCOSE, CAPILLARY
GLUCOSE-CAPILLARY: 161 mg/dL — AB (ref 65–99)
Glucose-Capillary: 133 mg/dL — ABNORMAL HIGH (ref 65–99)
Glucose-Capillary: 215 mg/dL — ABNORMAL HIGH (ref 65–99)

## 2017-04-16 LAB — TROPONIN I

## 2017-04-16 LAB — MAGNESIUM: MAGNESIUM: 2 mg/dL (ref 1.7–2.4)

## 2017-04-16 LAB — LACTIC ACID, PLASMA
LACTIC ACID, VENOUS: 2.5 mmol/L — AB (ref 0.5–1.9)
Lactic Acid, Venous: 2.7 mmol/L (ref 0.5–1.9)
Lactic Acid, Venous: 2.2 mmol/L (ref 0.5–1.9)

## 2017-04-16 LAB — HEPARIN LEVEL (UNFRACTIONATED): HEPARIN UNFRACTIONATED: 3.26 [IU]/mL — AB (ref 0.30–0.70)

## 2017-04-16 LAB — APTT: APTT: 78 s — AB (ref 24–36)

## 2017-04-16 MED ORDER — SODIUM CHLORIDE 0.9 % IV SOLN
INTRAVENOUS | Status: AC
  Administered 2017-04-16: 01:00:00 via INTRAVENOUS

## 2017-04-16 MED ORDER — INSULIN ASPART 100 UNIT/ML ~~LOC~~ SOLN
0.0000 [IU] | Freq: Three times a day (TID) | SUBCUTANEOUS | Status: DC
  Administered 2017-04-17 (×2): 2 [IU] via SUBCUTANEOUS
  Filled 2017-04-16 (×2): qty 1

## 2017-04-16 MED ORDER — APIXABAN 5 MG PO TABS
5.0000 mg | ORAL_TABLET | Freq: Two times a day (BID) | ORAL | Status: DC
  Administered 2017-04-16 – 2017-04-17 (×3): 5 mg via ORAL
  Filled 2017-04-16 (×3): qty 1

## 2017-04-16 NOTE — Progress Notes (Signed)
Rhonda Hurley at [place] NAME: Rhonda Hurley    MR#:  [id number]  DATE OF BIRTH:  1927-01-28  SUBJECTIVE:  CHIEF COMPLAINT:   Chief Complaint  Patient presents with  . Bradycardia    Came feeling weak, dizzi, some pressure in chest.   Found to have bradyucardia. Was on multiple rate controlling meds for A fib, stopped, HR stable today, and feels better. Seen by Cardio.  REVIEW OF SYSTEMS:  CONSTITUTIONAL: No fever,positive for fatigue or weakness.  EYES: No blurred or double vision.  EARS, NOSE, AND THROAT: No tinnitus or ear pain.  RESPIRATORY: No cough, shortness of breath, wheezing or hemoptysis.  CARDIOVASCULAR: No chest pain, orthopnea, edema.  GASTROINTESTINAL: No nausea, vomiting, diarrhea or abdominal pain.  GENITOURINARY: No dysuria, hematuria.  ENDOCRINE: No polyuria, nocturia,  HEMATOLOGY: No anemia, easy bruising or bleeding SKIN: No rash or lesion. MUSCULOSKELETAL: No joint pain or arthritis.   NEUROLOGIC: No tingling, numbness, weakness.  PSYCHIATRY: No anxiety or depression.   ROS  DRUG ALLERGIES:  No Known Allergies  VITALS:  Blood pressure (!) 131/53, pulse 60, temperature 98.2 F (36.8 C), temperature source Oral, resp. rate 18, height 5\' 5"  (1.651 m), weight 74.8 kg (164 lb 14.5 oz), SpO2 90 %.  PHYSICAL EXAMINATION:  GENERAL:  83 y.o.-year-old patient lying in the bed with no acute distress.  EYES: Pupils equal, round, reactive to light and accommodation. No scleral icterus. Extraocular muscles intact.  HEENT: Head atraumatic, normocephalic. Oropharynx and nasopharynx clear.  NECK:  Supple, no jugular venous distention. No thyroid enlargement, no tenderness.  LUNGS: Normal breath sounds bilaterally, no wheezing, rales,rhonchi or crepitation. No use of accessory muscles of respiration.  CARDIOVASCULAR: S1, S2 normal. No murmurs, rubs, or gallops.  ABDOMEN: Soft, nontender, nondistended. Bowel sounds present. No  organomegaly or mass.  EXTREMITIES: No pedal edema, cyanosis, or clubbing.  NEUROLOGIC: Cranial nerves II through XII are intact. Muscle strength 5/5 in all extremities. Sensation intact. Gait not checked.  PSYCHIATRIC: The patient is alert and oriented x 3.  SKIN: No obvious rash, lesion, or ulcer.   Physical Exam LABORATORY PANEL:   CBC  Recent Labs Lab 04/16/17 0352  WBC 14.1*  HGB 10.8*  HCT 33.2*  PLT 342   ------------------------------------------------------------------------------------------------------------------  Chemistries   Recent Labs Lab 04/16/17 0352  NA 136  K 4.1  CL 107  CO2 21*  GLUCOSE 159*  BUN 28*  CREATININE 1.01*  CALCIUM 8.9  MG 2.0   ------------------------------------------------------------------------------------------------------------------  Cardiac Enzymes  Recent Labs Lab 04/15/17 2158 04/16/17 0352  TROPONINI <0.03 <0.03   ------------------------------------------------------------------------------------------------------------------  RADIOLOGY:  Dg Chest 2 View  Result Date: 04/15/2017 CLINICAL DATA:  Weakness and chest tightness EXAM: CHEST  2 VIEW COMPARISON:  04/04/2017 FINDINGS: Cardiac shadow is again enlarged. Aortic calcifications are seen. The lungs are well aerated bilaterally. No focal infiltrate or sizable effusion is seen. Degenerative changes of the thoracic spine are noted. IMPRESSION: No acute abnormality noted. Electronically Signed   By: Rhonda Hurley M.D.   On: 04/15/2017 14:09    ASSESSMENT AND PLAN:   Active Problems:   AV junctional bradycardia   Generalized weakness   Acute renal insufficiency   Hyperglycemia   Lactic acidosis   #1. AV junctional bradycardia.  Appreciated  cardiologist consultation, stop Flecainide and Toprol, follow closely clinically, checked TSH   Cardio suggested to monitor off rate controlling meds, and if have tachy, may resume small dose of metoprolol. #2.  Generalized  weakness, likely due to junctional bradycardia, getting physical therapist involved for further recommendations, suggest Out pt PT. #3. Acute renal insufficiency, likely due to recent diarrhea, checked urinalysis, initiate patient on low rate IV fluids, reassess creatinine in the morning, Improved now. Her UA have 6-30 WBCs but pt does not have any symptoms, so no need to treat. #4. Lactic acidosis, continue IV fluids, follow lactic acid level, likely due to poor peripheral perfusion, recent diarrhea/volume depletion   Still high, but coming down, monitor. #5. Leukocytosis, checked urinalysis, no clear source so far, monitor. #6. History of essential hypertension, patient's blood pressure is low, holding all blood pressure medications, low rate IV fluids, watch for fluid overload in the setting of severe bradycardia   As euvolemic, stopped IV fluids now.   All the records are reviewed and case discussed with Care Management/Social Workerr. Management plans discussed with the patient, family and they are in agreement.  CODE STATUS: DNR  TOTAL TIME TAKING CARE OF THIS PATIENT: 35 minutes.     POSSIBLE D/C IN 1-2 DAYS, DEPENDING ON CLINICAL CONDITION.   Rhonda Hurley M.D on 04/16/2017   Between 7am to 6pm - Pager - [phone]  After 6pm go to [email or web address] - password [email or web address] [facility]  Office  [phone]  CC: Primary care physician; Rhonda Pitch, MD  Note: This dictation was prepared with Dragon dictation along with smaller phrase technology. Any transcriptional errors that result from this process are unintentional.

## 2017-04-16 NOTE — Plan of Care (Signed)
Problem: Safety: Goal: Ability to remain free from injury will improve Bed alarm turned on at all times Bed alarm turned on at all times

## 2017-04-16 NOTE — Care Management (Signed)
Presents from [place] - independent living. Physical therapy has recommended outpatient therapy follow up.  Left message at [facility] to review criteria for  [facility] outpatient physical therapy order

## 2017-04-16 NOTE — Progress Notes (Signed)
ANTICOAGULATION CONSULT NOTE - Initial Consult  Pharmacy Consult for Heparin Indication: atrial fibrillation  No Known Allergies  Patient Measurements: Height: 5\' 5"  (165.1 cm) Weight: 164 lb 14.5 oz (74.8 kg) IBW/kg (Calculated) : 57 Heparin Dosing Weight: 73 kg  Vital Signs: Temp: 98.2 F (36.8 C) (08/15 0406) Temp Source: Oral (08/15 0406) BP: 131/53 (08/15 0406) Pulse Rate: 60 (08/15 0406)  Labs:  Recent Labs  04/15/17 1320 04/15/17 1606 04/15/17 1943 04/15/17 2158 04/16/17 0352  HGB 11.2*  --   --   --  10.8*  HCT 34.3*  --   --   --  33.2*  PLT 357  --   --   --  342  APTT  --   --  36  --  78*  LABPROT 19.9*  --   --   --   --   INR 1.67  --   --   --   --   HEPARINUNFRC  --   --  >3.60*  --   --   CREATININE 1.31*  --   --   --  1.01*  TROPONINI <0.03 <0.03  --  <0.03 <0.03    Estimated Creatinine Clearance: 39 mL/min (A) (by C-G formula based on SCr of 1.01 mg/dL (H)).   Medical History: Past Medical History:  Diagnosis Date  . Acute cerebrovascular accident [facility])    a. 10/2012: MRI concern for posible small acute infarct along the left parietal cortex  . BP (high blood pressure) 04/08/2014  . Chronic diastolic CHF (congestive heart failure) ([place])    a. 03/2007 Echo: nl EF, mod MR,mild TR, Diast dysfxn; b. echo 10/2012: EF 50-55%, possible HK of anterior & anteroseptal wall, mild to mod LVH, nl RVSP.  Marland Kitchen Derangement of posterior horn of medial meniscus   . Essential hypertension, benign   . LBBB (left bundle branch block)   . Mitral valve disorders(424.0)    a. echo 03/2007: nl LVEF, mod MR, mmild TR, DD; b. echo 10/2012: EF 50-55%, possible HK of anterior & anteroseptal wall, mild to mod LVH, nl RVSP  . Other and unspecified hyperlipidemia   . Paroxysmal atrial fibrillation (HCC)    a. CHA2DS2VASc = 8-->eliquis;  b. Managed w/ flecainide and BB therapy.  Marland Kitchen TIA (transient ischemic attack) 2014  . Type II or unspecified type diabetes mellitus without  mention of complication, not stated as uncontrolled   . Unspecified hypothyroidism     Assessment: 83 yo female on apixaban PTA to transition to heparin drip. Called pt who stated last dose of Eliquis was between 6-7 am this morning.  Cardiologist order states to begin [heparin] in place of apixaban (next dose would be due at 2200). Text paged MD FYI about starting heparin drip at 1900 (~12h since last apixaban dose).  Hgb 11.2, Plt 357, INR 1.67  Goal of Therapy:  APTT 66 - 109 sec Heparin level 0.3-0.7 units/ml Monitor platelets by anticoagulation protocol: Yes   Plan:  STAT APTT and heparin level ordered as baseline labs Start heparin drip with no bolus at 1000 units/hr (=10 ml/hr) at 1900. Informed RN of plan to get labs before starting heparin drip and starting heparin drip at 1900. Stated she would inform the night shift RN.  APTT level and CBC for tomorrow ordered  8/15 @ 0400 aPTT 78 therapeutic. Will continue current rate and will recheck HL/aPTT @ 1200. Will continue to dose off of aPTT until HL correlates.  Pharmacy will continue to follow.  Tobie Lords, PharmD, BCPS Clinical Pharmacist 04/16/2017

## 2017-04-16 NOTE — Progress Notes (Signed)
Patient Name: Rhonda Hurley Date of Encounter: 04/16/2017  Primary Cardiologist: [facility] Problem List     Active Problems:   AV junctional bradycardia   Generalized weakness   Acute renal insufficiency   Hyperglycemia   Lactic acidosis     Subjective   Heart rate improved, now 60s to 70s bpm in NSR. Notes epigastric pain. Has had some diarrhea leading up to this admission, now improved. Recently started Lexapro per her report.   Inpatient Medications    Scheduled Meds: . docusate sodium  100 mg Oral BID  . levothyroxine  112 mcg Oral QAC breakfast  . simvastatin  20 mg Oral QHS  . sodium chloride flush  3 mL Intravenous Q12H  . vitamin B-12  1,000 mcg Oral Daily   Continuous Infusions: . sodium chloride    . sodium chloride 125 mL/hr at 04/16/17 0043  . heparin 1,000 Units/hr (04/15/17 2026)   PRN Meds: sodium chloride, acetaminophen **OR** acetaminophen, ondansetron **OR** ondansetron (ZOFRAN) IV, sodium chloride flush, zolpidem   Vital Signs    Vitals:   04/15/17 1608 04/15/17 1938 04/15/17 2339 04/16/17 0406  BP: (!) 118/45 98/69 (!) 119/58 (!) 131/53  Pulse: (!) 37 (!) 45 (!) 44 60  Resp: 18 17  18   Temp: 98.1 F (36.7 C) 97.6 F (36.4 C)  98.2 F (36.8 C)  TempSrc:  Oral  Oral  SpO2: 91% 95%  90%  Weight:    164 lb 14.5 oz (74.8 kg)  Height:        Intake/Output Summary (Last 24 hours) at 04/16/17 0743 Last data filed at 04/16/17 0446  Gross per 24 hour  Intake           305.67 ml  Output              300 ml  Net             5.67 ml   Filed Weights   04/15/17 1318 04/16/17 0406  Weight: 170 lb (77.1 kg) 164 lb 14.5 oz (74.8 kg)    Physical Exam    GEN: Well nourished, well developed, in no acute distress.  HEENT: Grossly normal.  Neck: Supple, no JVD, carotid bruits, or masses. Cardiac: RRR, no murmurs, rubs, or gallops. No clubbing, cyanosis, edema.  Radials/DP/PT 2+ and equal bilaterally.  Respiratory:  Respirations  regular and unlabored, clear to auscultation bilaterally. GI: Soft, nontender, nondistended, BS + x 4. MS: no deformity or atrophy. Skin: warm and dry, no rash. Neuro:  Strength and sensation are intact. Psych: AAOx3.  Normal affect.  Labs    CBC  Recent Labs  04/15/17 1320 04/16/17 0352  WBC 12.7* 14.1*  HGB 11.2* 10.8*  HCT 34.3* 33.2*  MCV 87.3 87.1  PLT 357 073   Basic Metabolic Panel  Recent Labs  04/15/17 1320 04/16/17 0352  NA 137 136  K 4.4 4.1  CL 105 107  CO2 21* 21*  GLUCOSE 151* 159*  BUN 29* 28*  CREATININE 1.31* 1.01*  CALCIUM 9.3 8.9  MG 1.6*  --    Liver Function Tests No results for input(s): AST, ALT, ALKPHOS, BILITOT, PROT, ALBUMIN in the last 72 hours. No results for input(s): LIPASE, AMYLASE in the last 72 hours. Cardiac Enzymes  Recent Labs  04/15/17 1606 04/15/17 2158 04/16/17 0352  TROPONINI <0.03 <0.03 <0.03   BNP Invalid input(s): POCBNP D-Dimer No results for input(s): DDIMER in the last 72 hours. Hemoglobin A1C No results  for input(s): HGBA1C in the last 72 hours. Fasting Lipid Panel No results for input(s): CHOL, HDL, LDLCALC, TRIG, CHOLHDL, LDLDIRECT in the last 72 hours. Thyroid Function Tests  Recent Labs  04/15/17 1606  TSH 2.828    Telemetry    NSR, 60s bpm, LBBB, 1st degree AV block - Personally Reviewed  ECG    n/a - Personally Reviewed  Radiology    Dg Chest 2 View  Result Date: 04/15/2017 IMPRESSION: No acute abnormality noted. Electronically Signed   By: Inez Catalina M.D.   On: 04/15/2017 14:09    Cardiac Studies   TTE pending  Patient Profile     83 y.o. female with history of PAF, HTN, HL, hypothyroidism, TIA/CVA, and valvular heart dzs, who is being seen today for the evaluation of bradycardia and chest pain.   Assessment & Plan    1. Junctional bradycardia: -Improved with holding of metoprolol, timilol, and flecainide  -Ambulate -If needed, consider adding back low-dose metoprolol  for her Afib -No indication for temp wire or PPM at this time -Consider outpatient cardiac monitoring -Digoxin level <0.2, TSH normal -Potassium at goal -Check magnesium level s/p repletion   2. PAF: -Maintaining sinus rhythm -Metoprolol and flecainide on hold given #1 -Stop heparin gtt, restart Elqiuis -If TTE shows stable EF, could transition back to Eliquis -Monitor for controled ventricular rates off rate-limiting medications -CHADS2VASc 6   3. Chest pain: -Troponin negative x 5 -Heparin gtt as above -Consider outpatient ischemic evaluation -Will be on Eliquis in place of ASA  4. HTN: -Controlled  5. HLD: -Statin  6. Hypothyroidism: -TSH normal  7. Diarrhea/abdominal pain: -Per IM  Signed, Christell Faith, PA-C [place] Pager: [phone] 04/16/2017, 7:43 AM   Attending Note Patient seen and examined, agree with detailed note above,  Patient presentation and plan discussed on rounds.   EKG lab work, chest x-ray, echocardiogram reviewed independently by myself  Long discussion with her this morning, reports that she took her first dose of Lexapro yesterday morning, shortly after felt incredibly exhausted, felt strange, chest tightness, weakness Presented to the emergency room found to be in junctional rhythm, Rate 36 bpm  Telemetry reviewed person by myself, converted back to normal sinus rhythm rate in the 70s,  Rhythm changed around 1:45 AM Feels somewhat better this morning, still with GI problems Reports having chronic GI upset Still feels weak  On physical exam she appears well, sitting up, lungs clear to auscultation, no JVP, heart sounds regular with normal S1-S2 no murmurs appreciated, abdomen soft nontender, no significant lower extremity edema  Lab work reviewed showing normal troponin, creatinine 1.0, No TSH available  --- Junctional rhythm Converted back to normal sinus rhythm one in the morning Would continue to hold beta blockers,  flecainide  --- Atrial fibrillation, paroxysmal High risk of recurrent arrhythmia given moderate LVH, dilated left atrium If recurrent arrhythmia would likely benefit from evaluation from EP  She might require pacemaker, and then amiodarone  --- Diarrhea/abdominal pain Etiology unclear She may benefit from abdominal ultrasound She still has gallbladder  Greater than 50% was spent in counseling and coordination of care with patient Total encounter time 35 minutes or more   Signed: Esmond Plants  M.D., Ph.D. [facility]

## 2017-04-16 NOTE — Evaluation (Signed)
Physical Therapy Evaluation Patient Details Name: Rhonda Hurley MRN: [id number] DOB: [birth year] Today's Date: 04/16/2017   History of Present Illness  Pt is a 83 y.o. female with a known history of Diabetes mellitus, glaucoma, breast cancer, ITP, hyperlipidemia, hypertension, hypothyroidism, paroxysmal atrial fibrillation, recent diagnosis of depression, who presents to the hospital with complaints of lightheadedness, weakness, just not feeling well, which happened after she took antidepressant Lexapro for the first time. Pt reported intermittent chest pain and was admitted for AV juntional bradycardia. Pt denied falls in last six months, and lives with husband in [place] living at [facility]. Pt reported she recently began using rollator at all times 2/2 dizziness.   Clinical Impression  Pt required incr. Time during all mobility 2/2 lightheadedness. Pt performed bed mobility at MOD I level, as HOB was elevated to approx. 30 degrees 2/2 dizziness. Pt reported hx N/T (constant) in B toes. Pt's BLE strength grossly 3+/5 to 4/5, pt's strength allows pt to amb. With rollator but is limited during amb. 2/2 lightheadedness and decr. Endurance. Pt required S to min guard during txfs and amb. With rollator to ensure safety. PT recommending OPPT to address dizziness and balance deficits, and would benefit from skilled PT to address above deficits and promote optimal return to PLOF. Pt agreeable to O[facility] at [facility].     Follow Up Recommendations Outpatient PT;Supervision for mobility/OOB    Equipment Recommendations  None recommended by PT    Recommendations for Other Services Other (comment) (OP PT for dizziness and balance)     Precautions / Restrictions Precautions Precautions: Fall;Other (comment) (lightheadedness) Restrictions Weight Bearing Restrictions: No      Mobility  Bed Mobility Overal bed mobility: Modified Independent             General bed mobility comments: Incr. time  and HOB at approx. 30 degrees, 2/2 dizziness.  Transfers Overall transfer level: Modified independent Equipment used: 4-wheeled walker             General transfer comment: Pt performed STS txfs with S to ensure safety and required seated and standing rest breaks to allow lightheadedness to subside prior to amb.   Ambulation/Gait Ambulation/Gait assistance: Min guard Ambulation Distance (Feet): 200 Feet Assistive device: 4-wheeled walker Gait Pattern/deviations: Decreased stride length;Step-through pattern;Decreased dorsiflexion - left;Decreased dorsiflexion - right     General Gait Details: Cues to improve upright posture and stride length during amb. Pt required one standing rest break 2/2 fatigue and lightheadedness.  Stairs            Wheelchair Mobility    Modified Rankin (Stroke Patients Only)       Balance Overall balance assessment: Needs assistance Sitting-balance support: Feet supported Sitting balance-Leahy Scale: Good Sitting balance - Comments: Pt able to maintain balance during MMT. Postural control: Posterior lean Standing balance support: Bilateral upper extremity supported (on rollator) Standing balance-Leahy Scale: Fair Standing balance comment: Pt required rollator to maintain balance                             Pertinent Vitals/Pain Pain Assessment: No/denies pain    Home Living Family/patient expects to be discharged to:: Other (Comment)                 Additional Comments: [facility]    Prior Function Level of Independence: Independent with assistive device(s)  Hand Dominance        Extremity/Trunk Assessment   Upper Extremity Assessment Upper Extremity Assessment: Generalized weakness    Lower Extremity Assessment Lower Extremity Assessment: Generalized weakness (Pt reported constant N/T in B toes, otherwise sensation WNL.)    Cervical / Trunk Assessment Cervical / Trunk  Assessment:  [facility])  Communication   Communication: No difficulties  Cognition Arousal/Alertness: Awake/alert Behavior During Therapy: WFL for tasks assessed/performed Overall Cognitive Status: Within Functional Limits for tasks assessed                                        General Comments      Exercises     Assessment/Plan    PT Assessment Patient needs continued PT services  PT Problem List Decreased strength;Decreased activity tolerance;Decreased balance;Decreased mobility;Decreased knowledge of use of DME;Impaired sensation       PT Treatment Interventions DME instruction;Gait training;Functional mobility training;Therapeutic activities;Therapeutic exercise;Balance training;Neuromuscular re-education;Patient/family education;Manual techniques    PT Goals (Current goals can be found in the Care Plan section)  Acute Rehab PT Goals Patient Stated Goal: To make this dizziness stop. PT Goal Formulation: With patient Time For Goal Achievement: 04/30/17 Potential to Achieve Goals: Good    Frequency Min 2X/week   Barriers to discharge  (none)      Co-evaluation               AM-PAC PT "6 Clicks" Daily Activity  Outcome Measure Difficulty turning over in bed (including adjusting bedclothes, sheets and blankets)?: None Difficulty moving from lying on back to sitting on the side of the bed? : None Difficulty sitting down on and standing up from a chair with arms (e.g., wheelchair, bedside commode, etc,.)?: A Little Help needed moving to and from a bed to chair (including a wheelchair)?: A Little Help needed walking in hospital room?: A Little Help needed climbing 3-5 steps with a railing? : A Lot 6 Click Score: 19    End of Session Equipment Utilized During Treatment: Gait belt;Other (comment) (rollator) Activity Tolerance: Patient tolerated treatment well Patient left: in bed;with call bell/phone within reach;with bed alarm set;with family/visitor  present Nurse Communication: Other (comment) (PT spoke with care management re: OP PT) PT Visit Diagnosis: Unsteadiness on feet (R26.81);Other abnormalities of gait and mobility (R26.89);Dizziness and giddiness (R42)    Time: 2103-1281 PT Time Calculation (min) (ACUTE ONLY): 21 min   Charges:   PT Evaluation $PT Eval Low Complexity: 1 Low PT Treatments $Gait Training: 8-22 mins   PT G CodesGeoffry Paradise, PT,DPT 04/16/17 11:12 AM Phone: [phone] Fax: [phone]    Rhonda Hurley 04/16/2017, 11:08 AM

## 2017-04-16 NOTE — Progress Notes (Signed)
CRITICAL VALUE ALERT  Critical Value: Lactic Acid lab  Date & Time Notied: 08/15 + 12:55 PM  Provider Notified: N/A  Orders Received/Actions taken: Trending down.   Harlene Ramus

## 2017-04-17 ENCOUNTER — Telehealth: Payer: Self-pay | Admitting: Cardiovascular Disease

## 2017-04-17 DIAGNOSIS — R5381 Other malaise: Secondary | ICD-10-CM

## 2017-04-17 DIAGNOSIS — R5383 Other fatigue: Secondary | ICD-10-CM

## 2017-04-17 DIAGNOSIS — I498 Other specified cardiac arrhythmias: Secondary | ICD-10-CM

## 2017-04-17 DIAGNOSIS — I10 Essential (primary) hypertension: Secondary | ICD-10-CM

## 2017-04-17 LAB — CBC
HCT: 33.9 % — ABNORMAL LOW (ref 35.0–47.0)
Hemoglobin: 11.4 g/dL — ABNORMAL LOW (ref 12.0–16.0)
MCH: 29.2 pg (ref 26.0–34.0)
MCHC: 33.6 g/dL (ref 32.0–36.0)
MCV: 86.9 fL (ref 80.0–100.0)
PLATELETS: 343 10*3/uL (ref 150–440)
RBC: 3.9 MIL/uL (ref 3.80–5.20)
RDW: 16.1 % — ABNORMAL HIGH (ref 11.5–14.5)
WBC: 13.1 10*3/uL — ABNORMAL HIGH (ref 3.6–11.0)

## 2017-04-17 LAB — BASIC METABOLIC PANEL
Anion gap: 8 (ref 5–15)
BUN: 13 mg/dL (ref 6–20)
CALCIUM: 9 mg/dL (ref 8.9–10.3)
CO2: 24 mmol/L (ref 22–32)
CREATININE: 0.66 mg/dL (ref 0.44–1.00)
Chloride: 108 mmol/L (ref 101–111)
GFR calc Af Amer: >60 mL/min (ref >60)
GFR calc non Af Amer: >60 mL/min (ref >60)
GLUCOSE: 147 mg/dL — AB (ref 65–99)
Potassium: 3.6 mmol/L (ref 3.5–5.1)
Sodium: 140 mmol/L (ref 135–145)

## 2017-04-17 LAB — GLUCOSE, CAPILLARY
GLUCOSE-CAPILLARY: 162 mg/dL — AB (ref 65–99)
Glucose-Capillary: 169 mg/dL — ABNORMAL HIGH (ref 65–99)

## 2017-04-17 MED ORDER — DILTIAZEM HCL ER COATED BEADS 120 MG PO CP24
120.0000 mg | ORAL_CAPSULE | Freq: Every day | ORAL | 1 refills | Status: AC
Start: 2017-04-17 — End: 2018-04-17

## 2017-04-17 MED ORDER — ATORVASTATIN CALCIUM 10 MG PO TABS: 10.0000 mg | ORAL_TABLET | Freq: Every day | ORAL | 1 refills | Status: AC

## 2017-04-17 MED ORDER — DILTIAZEM HCL 30 MG PO TABS
ORAL_TABLET | ORAL | Status: AC
  Filled 2017-04-17: qty 1

## 2017-04-17 MED ORDER — RAMIPRIL 10 MG PO CAPS
10.0000 mg | ORAL_CAPSULE | Freq: Every day | ORAL | Status: DC
  Administered 2017-04-17: 10 mg via ORAL
  Filled 2017-04-17: qty 1

## 2017-04-17 MED ORDER — DILTIAZEM HCL 30 MG PO TABS
30.0000 mg | ORAL_TABLET | Freq: Four times a day (QID) | ORAL | Status: DC
  Administered 2017-04-17 (×2): 30 mg via ORAL
  Filled 2017-04-17: qty 1

## 2017-04-17 NOTE — Care Management (Signed)
Have requested order for outpatient physical therapy order and walker from attending.  Manually faxed physical therapy notes to [facility].  [facility] to provide walker

## 2017-04-17 NOTE — Telephone Encounter (Signed)
TCM ph [facility] for Bradycardia needs 1 week fu  Scheduled with Christell Faith 8/22

## 2017-04-17 NOTE — Discharge Instructions (Signed)

## 2017-04-17 NOTE — Progress Notes (Signed)
Patient Name: Rhonda Hurley Date of Encounter: 04/17/2017  Primary Cardiologist: [facility] Problem List     Active Problems:   AV junctional bradycardia   Generalized weakness   Acute renal insufficiency   Hyperglycemia   Lactic acidosis     Subjective   No further episodes of junctional rhythm. Brief episode of tachycardic rate into the 120s bpm. Currently in NSR with heart rates in the 90s bpm. No further abdominal pain. No further diarrhea. Asking about breakfast.   Inpatient Medications    Scheduled Meds: . apixaban  5 mg Oral BID  . docusate sodium  100 mg Oral BID  . insulin aspart  0-9 Units Subcutaneous TID WC  . levothyroxine  112 mcg Oral QAC breakfast  . simvastatin  20 mg Oral QHS  . sodium chloride flush  3 mL Intravenous Q12H  . vitamin B-12  1,000 mcg Oral Daily   Continuous Infusions: . sodium chloride     PRN Meds: sodium chloride, acetaminophen **OR** acetaminophen, ondansetron **OR** ondansetron (ZOFRAN) IV, sodium chloride flush, zolpidem   Vital Signs    Vitals:   04/16/17 0406 04/16/17 1230 04/16/17 1943 04/17/17 0418  BP: (!) 131/53 (!) 151/56 (!) 150/50 (!) 167/61  Pulse: 60 66 77 90  Resp: 18 18 18 18   Temp: 98.2 F (36.8 C) 98.3 F (36.8 C) 99 F (37.2 C) 98.6 F (37 C)  TempSrc: Oral Oral Oral Oral  SpO2: 90% 95% 95% 98%  Weight: 164 lb 14.5 oz (74.8 kg)   155 lb 1.6 oz (70.4 kg)  Height:        Intake/Output Summary (Last 24 hours) at 04/17/17 0750 Last data filed at 04/17/17 0559  Gross per 24 hour  Intake              480 ml  Output             3400 ml  Net            -2920 ml   Filed Weights   04/15/17 1318 04/16/17 0406 04/17/17 0418  Weight: 170 lb (77.1 kg) 164 lb 14.5 oz (74.8 kg) 155 lb 1.6 oz (70.4 kg)    Physical Exam    GEN: Well nourished, well developed, in no acute distress.  HEENT: Grossly normal.  Neck: Supple, no JVD, carotid bruits, or masses. Cardiac: RRR, II/VI systolic murmur, no  rubs, or gallops. No clubbing, cyanosis, edema.  Radials/DP/PT 2+ and equal bilaterally.  Respiratory:  Respirations regular and unlabored, clear to auscultation bilaterally. GI: Soft, nontender, nondistended, BS + x 4. MS: no deformity or atrophy. Skin: warm and dry, no rash. Neuro:  Strength and sensation are intact. Psych: AAOx3.  Normal affect.  Labs    CBC  Recent Labs  04/16/17 0352 04/17/17 0517  WBC 14.1* 13.1*  HGB 10.8* 11.4*  HCT 33.2* 33.9*  MCV 87.1 86.9  PLT 342 774   Basic Metabolic Panel  Recent Labs  04/15/17 1320 04/16/17 0352 04/17/17 0517  NA 137 136 140  K 4.4 4.1 3.6  CL 105 107 108  CO2 21* 21* 24  GLUCOSE 151* 159* 147*  BUN 29* 28* 13  CREATININE 1.31* 1.01* 0.66  CALCIUM 9.3 8.9 9.0  MG 1.6* 2.0  --    Liver Function Tests No results for input(s): AST, ALT, ALKPHOS, BILITOT, PROT, ALBUMIN in the last 72 hours. No results for input(s): LIPASE, AMYLASE in the last 72 hours. Cardiac Enzymes  Recent Labs  04/15/17 1606 04/15/17 2158 04/16/17 0352  TROPONINI <0.03 <0.03 <0.03   BNP Invalid input(s): POCBNP D-Dimer No results for input(s): DDIMER in the last 72 hours. Hemoglobin A1C No results for input(s): HGBA1C in the last 72 hours. Fasting Lipid Panel No results for input(s): CHOL, HDL, LDLCALC, TRIG, CHOLHDL, LDLDIRECT in the last 72 hours. Thyroid Function Tests  Recent Labs  04/15/17 1606  TSH 2.828    Telemetry    NSR, 90s bpm - Personally Reviewed  ECG    n/a - Personally Reviewed  Radiology    Dg Chest 2 View  Result Date: 04/15/2017 IMPRESSION: No acute abnormality noted. Electronically Signed   By: Inez Catalina M.D.   On: 04/15/2017 14:09    Cardiac Studies   TTE 04/15/17: Study Conclusions  - Left ventricle: The cavity size was normal. Wall thickness was   increased in a pattern of moderate LVH. There was moderate   concentric hypertrophy. Systolic function was normal. The   estimated ejection  fraction was in the range of 60% to 65%. Wall   motion was normal; there were no regional wall motion   abnormalities. The study is not technically sufficient to allow   evaluation of LV diastolic function. - Mitral valve: There was mild regurgitation. - Left atrium: The atrium was moderately dilated. - Right ventricle: Systolic function was normal. - Pulmonary arteries: Systolic pressure was mildly elevated. PA   peak pressure: 36 mm Hg (S).  Impressions:  - Junctional rhythm, rate 40 bpm.  Patient Profile     83 y.o. female with history of PAF, HTN, HL, hypothyroidism, TIA/CVA, and valvular heart dzs, who is being seen today for the evaluation of bradycardia and chest pain.  Assessment & Plan    1. Junctional bradycardia: -Improved with holding of metoprolol, timilol, and flecainide  -Ambulate -Continue to hold beta blocker and flecainide -If she has recurrent arrhythmia or tachycardic heart rate, she may require EP evaluation for possible PPM and amiodarone therapy -Perhaps she could try metoprolol 12.5 mg bid if needed -No indication for temp wire or PPM at this time -Consider outpatient cardiac monitoring -Digoxin level <0.2, TSH normal -Potassium at goal -Magnesium repleted  2. PAF: -Maintaining sinus rhythm -Metoprolol and flecainide on hold given #1 -Continue Eliquis -TTE as above -Monitor for controled ventricular rates off rate-limiting medications -CHADS2VASc 6  3. Chest pain: -Troponin negative x 5 -Heparin gtt as above -Consider outpatient ischemic evaluation -Will be on Eliquis in place of ASA  4. HTN: -Controlled  5. HLD: -Statin  6. Hypothyroidism: -TSH normal  7. Diarrhea/abdominal pain: -Resolved -Per IM  Signed, Christell Faith, PA-C [place] Pager: [phone] 04/17/2017, 7:50 AM   Attending Note Patient seen and examined, agree with detailed note above,  Patient presentation and plan discussed on rounds.    Telemetry reviewed, normal sinus rhythm, no significant junctional rhythm or other arrhythmia Feels well, reports no GI distress as she had yesterday Feels that she is ready to go home Blood pressure higher, heart rate higher Echocardiogram results reviewed with her showing moderate LVH, moderately dilated left atrium, normal ejection fraction  On physical exam she appears well, sitting up, lungs clear to auscultation, no JVP, heart sounds regular with normal S1-S2 no murmurs appreciated, abdomen soft nontender, no significant lower extremity edema  Lab work reviewed showing normal troponin, creatinine 1.0, No TSH available  Profile: 83 year old woman with history of paroxysmal atrial fibrillation presenting with junctional rhythm at rate in  the high 30s with weakness  first dose of Lexapro morning of admission, shortly after felt incredibly exhausted, felt strange, chest tightness, weakness  A/P --- Junctional rhythm Converted back to normal sinus rhythm shortly after admission Would continue to hold beta blockers, flecainide  --- Atrial fibrillation, paroxysmal High risk of recurrent arrhythmia given moderate LVH, dilated left atrium For now would recommend we start diltiazem extended release 120 mg daily Hold the amlodipine Close outpatient follow-up For recurrent atrial fibrillation she might need pacemaker/amiodarone  --- Diarrhea/abdominal pain Etiology unclear Symptoms resolved   Greater than 50% was spent in counseling and coordination of care with patient Total encounter time 25 minutes or more   Signed: Esmond Plants  M.D., Ph.D. [facility]

## 2017-04-17 NOTE — Plan of Care (Signed)
Problem: Safety: Goal: Ability to remain free from injury will improve Bed alarm turned on at all times  Outcome: Progressing Pt will remain injury free while in my care. Exit alarm is activated and patient is instructed to call out for assistance with activity.

## 2017-04-17 NOTE — Progress Notes (Signed)
Physical Therapy Treatment Patient Details Name: Rhonda Hurley MRN: [id number] DOB: 1927-07-28 Today's Date: 04/17/2017    History of Present Illness is a 83 y.o. female with a known history of Diabetes mellitus, glaucoma, breast cancer, ITP, hyperlipidemia, hypertension, hypothyroidism, paroxysmal atrial fibrillation, recent diagnosis of depression, who presents to the hospital with complaints of lightheadedness, weakness, just not feeling well, which happened today after she took antidepressant Lexapro for the first time. Pt reported intermittent chest pain and was admitted for AV juntional bradycardia.    PT Comments    Pt ready for therapy and happy to walk.  Stated she has been feeling generally weak since admission.  She was able to get out of bed without assist.  Ambulation around unit x 1 with min guard and overall steady gait but pt reports feeling weak.  HR 112-114 during gait.  She did use a rolling walker today as family had taken home rollator that they purchased for her.  During gait, pt stated she preferred with regular walker over the rollator as it felt more steady for her.  A rolling walker was requested and care manager notified.   Follow Up Recommendations  Outpatient PT;Supervision for mobility/OOB     Equipment Recommendations  Rolling walker with 5" wheels    Recommendations for Other Services       Precautions / Restrictions Precautions Precautions: Fall;Other (comment) Restrictions Weight Bearing Restrictions: No    Mobility  Bed Mobility Overal bed mobility: Modified Independent                Transfers Overall transfer level: Modified independent Equipment used: Rolling walker (2 wheeled)             General transfer comment: less dizziness noted today  Ambulation/Gait Ambulation/Gait assistance: Min guard Ambulation Distance (Feet): 200 Feet Assistive device: Rolling walker (2 wheeled)     Gait velocity interpretation: <1.8  ft/sec, indicative of risk for recurrent falls General Gait Details: family bought and took rollator walker home.  rolling walker used and pt stated she liked it better.   Stairs            Wheelchair Mobility    Modified Rankin (Stroke Patients Only)       Balance Overall balance assessment: Needs assistance Sitting-balance support: Feet supported Sitting balance-Leahy Scale: Good     Standing balance support: Bilateral upper extremity supported Standing balance-Leahy Scale: Fair                              Cognition Arousal/Alertness: Awake/alert Behavior During Therapy: WFL for tasks assessed/performed Overall Cognitive Status: Within Functional Limits for tasks assessed                                        Exercises      General Comments        Pertinent Vitals/Pain Pain Assessment: No/denies pain    Home Living                      Prior Function            PT Goals (current goals can now be found in the care plan section) Progress towards PT goals: Progressing toward goals    Frequency    Min 2X/week      PT Plan Current plan remains  appropriate    Co-evaluation              AM-PAC PT "6 Clicks" Daily Activity  Outcome Measure  Difficulty turning over in bed (including adjusting bedclothes, sheets and blankets)?: None Difficulty moving from lying on back to sitting on the side of the bed? : None Difficulty sitting down on and standing up from a chair with arms (e.g., wheelchair, bedside commode, etc,.)?: None Help needed moving to and from a bed to chair (including a wheelchair)?: A Little Help needed walking in hospital room?: A Little Help needed climbing 3-5 steps with a railing? : A Lot 6 Click Score: 20    End of Session Equipment Utilized During Treatment: Gait belt;Other (comment) Activity Tolerance: Patient tolerated treatment well Patient left: in bed;with call bell/phone  within reach;with bed alarm set         Time: 3435-6861 PT Time Calculation (min) (ACUTE ONLY): 8 min  Charges:  $Gait Training: 8-22 mins                    G Codes:       Chesley Noon, PTA 04/17/17, 9:44 AM

## 2017-04-17 NOTE — Telephone Encounter (Signed)
Admitted

## 2017-04-18 NOTE — Telephone Encounter (Signed)
Patient contacted regarding discharge from [facility] on 04/17/17.  Patient understands to follow up with provider Christell Faith PA on 04/23/17 at 2:30 PM at [facility]. Patient understands discharge instructions? Yes Patient understands medications and regiment? Yes Patient understands to bring all medications to this visit? Yes  Patient states that she did not feel well so far this morning but that she hopes with some rest she will feel better. She confirmed appointment information and states that she may not be able to keep that appointment if she is not feeling better. Instructed her to please give [place] a call back if she is unable to keep this time. She verbalized understanding of our conversation and had no further questions or concerns at this time.

## 2017-04-19 NOTE — Discharge Summary (Signed)
[facility] [place] NAME: Rhonda Hurley    MR#:  [id number]  DATE OF BIRTH:  [birth year]  DATE OF ADMISSION:  04/15/2017   ADMITTING PHYSICIAN: Theodoro Grist, MD  DATE OF DISCHARGE: 04/17/2017  5:50 PM  PRIMARY CARE PHYSICIAN: Juluis Pitch, MD   ADMISSION DIAGNOSIS:  Hypomagnesemia [E83.42] Bradycardia [R00.1] DISCHARGE DIAGNOSIS:  Active Problems:   AV junctional bradycardia   Generalized weakness   Acute renal insufficiency   Hyperglycemia   Lactic acidosis  SECONDARY DIAGNOSIS:   Past Medical History:  Diagnosis Date  . Acute cerebrovascular accident [facility])    a. 10/2012: MRI concern for posible small acute infarct along the left parietal cortex  . BP (high blood pressure) 04/08/2014  . Chronic diastolic CHF (congestive heart failure) ([place])    a. 03/2007 Echo: nl EF, mod MR,mild TR, Diast dysfxn; b. echo 10/2012: EF 50-55%, possible HK of anterior & anteroseptal wall, mild to mod LVH, nl RVSP.  Marland Kitchen Derangement of posterior horn of medial meniscus   . Essential hypertension, benign   . LBBB (left bundle branch block)   . Mitral valve disorders(424.0)    a. echo 03/2007: nl LVEF, mod MR, mmild TR, DD; b. echo 10/2012: EF 50-55%, possible HK of anterior & anteroseptal wall, mild to mod LVH, nl RVSP  . Other and unspecified hyperlipidemia   . Paroxysmal atrial fibrillation (HCC)    a. CHA2DS2VASc = 8-->eliquis;  b. Managed w/ flecainide and BB therapy.  Marland Kitchen TIA (transient ischemic attack) 2014  . Type II or unspecified type diabetes mellitus without mention of complication, not stated as uncontrolled   . Unspecified hypothyroidism    HOSPITAL COURSE:  83 y.o. female with history of PAF, HTN, HL, hypothyroidism, TIA/CVA, and valvular heart dzs, who is being seen today for the evaluation of bradycardia and chest pain.  1. Junctional bradycardia: -Improved with stopping metoprolol, and flecainide.could consider stopping timolol as  an outpt if more episodes -Ambulated fine with PT  -If she has recurrent arrhythmia or tachycardic heart rate, she may require EP evaluation for possible PPM and amiodarone  -No indication for temp wire or PPM at this time -Consider outpatient cardiac monitoring -Digoxin level <0.2, TSH normal -Potassium at goal -Magnesium repleted  2. PAF: -Maintaining sinus rhythm -Metoprolol and flecainide STOPPED -Continue Eliquis -TTE shows LVEF 60-65%, moderate LVH, moderately dilated left atrium -CHADS2VASc 6 - High risk of recurrent arrhythmia given moderate LVH, dilated left atrium - For now Cardio recommends discharging on diltiazem extended release 120 mg daily - stopping amlodipine - Close outpatient follow-up - For recurrent atrial fibrillation she might need pacemaker/amiodarone  3. Chest pain: -Troponin negative x 5 -Consider outpatient ischemic evaluation -Will be on Eliquis in place of ASA  4. HTN: -Controlled  5. HLD: -Statin  6. Hypothyroidism: -TSH normal  7. Diarrhea/abdominal pain: -Resolved - could be viral G.E.   Feels that she is ready to go home  DISCHARGE CONDITIONS:  stable CONSULTS OBTAINED:  Treatment Team:  Nelva Bush, MD DRUG ALLERGIES:  No Known Allergies DISCHARGE MEDICATIONS:   Allergies as of 04/17/2017   No Known Allergies     Medication List    STOP taking these medications   amLODipine 10 MG tablet Commonly known as:  NORVASC   flecainide 100 MG tablet Commonly known as:  TAMBOCOR   metoprolol tartrate 25 MG tablet Commonly known as:  LOPRESSOR   simvastatin 20 MG tablet Commonly known as:  ZOCOR  TAKE these medications   atorvastatin 10 MG tablet Commonly known as:  LIPITOR Take 1 tablet (10 mg total) by mouth daily.   diltiazem 120 MG 24 hr capsule Commonly known as:  CARDIZEM CD Take 1 capsule (120 mg total) by mouth daily.   ELIQUIS 5 MG Tabs tablet Generic drug:  apixaban TAKE 1 TABLET (5 MG  TOTAL) BY MOUTH 2 (TWO) TIMES DAILY.   escitalopram 5 MG tablet Commonly known as:  LEXAPRO Take 5 mg by mouth daily.   levothyroxine 112 MCG tablet Commonly known as:  SYNTHROID, LEVOTHROID Take 112 mcg by mouth every morning.   metFORMIN 500 MG tablet Commonly known as:  GLUCOPHAGE Take 1,000 mg by mouth 2 (two) times daily.   ramipril 10 MG capsule Commonly known as:  ALTACE TAKE 1 CAPSULE (10 MG TOTAL) BY MOUTH DAILY.   timolol 0.5 % ophthalmic solution Commonly known as:  TIMOPTIC 1 drop daily.   vitamin B-12 1000 MCG tablet Commonly known as:  CYANOCOBALAMIN Take 1,000 mcg by mouth daily.   Vitamin D3 1000 units Caps Take 1,000 Units by mouth daily.   zolpidem 5 MG tablet Commonly known as:  AMBIEN Take 2.5-5 mg by mouth at bedtime as needed for sleep.        DISCHARGE INSTRUCTIONS:   DIET:  Cardiac diet DISCHARGE CONDITION:  Stable ACTIVITY:  Activity as tolerated OXYGEN:  Home Oxygen: No.  Oxygen Delivery: room air DISCHARGE LOCATION:  home   If you experience worsening of your admission symptoms, develop shortness of breath, life threatening emergency, suicidal or homicidal thoughts you must seek medical attention immediately by calling 911 or calling your MD immediately  if symptoms less severe.  You Must read complete instructions/literature along with all the possible adverse reactions/side effects for all the Medicines you take and that have been prescribed to you. Take any new Medicines after you have completely understood and accpet all the possible adverse reactions/side effects.   Please note  You were cared for by a hospitalist during your hospital stay. If you have any questions about your discharge medications or the care you received while you were in the hospital after you are discharged, you can call the unit and asked to speak with the hospitalist on call if the hospitalist that took care of you is not available. Once you are  discharged, your primary care physician will handle any further medical issues. Please note that NO REFILLS for any discharge medications will be authorized once you are discharged, as it is imperative that you return to your primary care physician (or establish a relationship with a primary care physician if you do not have one) for your aftercare needs so that they can reassess your need for medications and monitor your lab values.    On the day of Discharge:  VITAL SIGNS:  Blood pressure 135/67, pulse 83, temperature 97.9 F (36.6 C), temperature source Oral, resp. rate 18, height 5\' 5"  (1.651 m), weight 70.4 kg (155 lb 1.6 oz), SpO2 96 %. PHYSICAL EXAMINATION:  GENERAL:  83 y.o.-year-old patient lying in the bed with no acute distress.  EYES: Pupils equal, round, reactive to light and accommodation. No scleral icterus. Extraocular muscles intact.  HEENT: Head atraumatic, normocephalic. Oropharynx and nasopharynx clear.  NECK:  Supple, no jugular venous distention. No thyroid enlargement, no tenderness.  LUNGS: Normal breath sounds bilaterally, no wheezing, rales,rhonchi or crepitation. No use of accessory muscles of respiration.  CARDIOVASCULAR: S1, S2 normal. No murmurs, rubs,  or gallops.  ABDOMEN: Soft, non-tender, non-distended. Bowel sounds present. No organomegaly or mass.  EXTREMITIES: No pedal edema, cyanosis, or clubbing.  NEUROLOGIC: Cranial nerves II through XII are intact. Muscle strength 5/5 in all extremities. Sensation intact. Gait not checked.  PSYCHIATRIC: The patient is alert and oriented x 3.  SKIN: No obvious rash, lesion, or ulcer.  DATA REVIEW:   CBC  Recent Labs Lab 04/17/17 0517  WBC 13.1*  HGB 11.4*  HCT 33.9*  PLT 343    Chemistries   Recent Labs Lab 04/16/17 0352 04/17/17 0517  NA 136 140  K 4.1 3.6  CL 107 108  CO2 21* 24  GLUCOSE 159* 147*  BUN 28* 13  CREATININE 1.01* 0.66  CALCIUM 8.9 9.0  MG 2.0  --      Follow-up Information     Juluis Pitch, MD. Schedule an appointment as soon as possible for a visit in 2 weeks.   Specialty:  Family Medicine Why:  PLEASE CALL THE OFFICE TO SCHEDULE THIS APPOINTMENT. THANKS! Contact information: [address] [address] [phone]        Minna Merritts, MD. Go on 04/23/2017.   Specialty:  Cardiology Why:  Appointment Time: 2:30pm Contact information: [place] [address] [phone]           High risk for readmissions.  Management plans discussed with the patient, family and they are in agreement.  CODE STATUS: Prior   TOTAL TIME TAKING CARE OF THIS PATIENT: 45 minutes.    Max Sane M.D on 04/19/2017 at 4:37 PM  Between 7am to 6pm - Pager - [phone]  After 6pm go to [email or web address] - Proofreader  [facility]  Office  [phone]  CC: Primary care physician; Juluis Pitch, MD   Note: This dictation was prepared with Dragon dictation along with smaller phrase technology. Any transcriptional errors that result from this process are unintentional.

## 2017-04-19 NOTE — ED Provider Notes (Signed)
[facility] Emergency Department Provider Note  ____________________________________________  Time seen: Approximately 6:59 AM  I have reviewed the triage vital signs and the nursing notes.   HISTORY  Chief Complaint Bradycardia   HPI Rhonda Hurley is a 83 y.o. female with a known history of Diabetes mellitus, glaucoma, breast cancer, ITP, hyperlipidemia, hypertension, hypothyroidism, paroxysmal atrial fibrillation, recent diagnosis of depression, who presents to the hospital with complaints of lightheadedness and generalized weakness. Symptoms started today after she took after taking her antidepressant which is a new medication. Patient also complaining of intermittent, chest tightness, central, non radiating since earlier today associated with SOB and nausea. No fever, chills, cough, abdominal pain, N/V, diarrhea, dysuria. Normal PO intake.  Past Medical History:  Diagnosis Date  . Acute cerebrovascular accident [facility])    a. 10/2012: MRI concern for posible small acute infarct along the left parietal cortex  . BP (high blood pressure) 04/08/2014  . Chronic diastolic CHF (congestive heart failure) ([place])    a. 03/2007 Echo: nl EF, mod MR,mild TR, Diast dysfxn; b. echo 10/2012: EF 50-55%, possible HK of anterior & anteroseptal wall, mild to mod LVH, nl RVSP.  Marland Kitchen Derangement of posterior horn of medial meniscus   . Essential hypertension, benign   . LBBB (left bundle branch block)   . Mitral valve disorders(424.0)    a. echo 03/2007: nl LVEF, mod MR, mmild TR, DD; b. echo 10/2012: EF 50-55%, possible HK of anterior & anteroseptal wall, mild to mod LVH, nl RVSP  . Other and unspecified hyperlipidemia   . Paroxysmal atrial fibrillation (HCC)    a. CHA2DS2VASc = 8-->eliquis;  b. Managed w/ flecainide and BB therapy.  Marland Kitchen TIA (transient ischemic attack) 2014  . Type II or unspecified type diabetes mellitus without mention of complication, not stated as uncontrolled     . Unspecified hypothyroidism     Patient Active Problem List   Diagnosis Date Noted  . AV junctional bradycardia 04/15/2017  . Generalized weakness 04/15/2017  . Acute renal insufficiency 04/15/2017  . Hyperglycemia 04/15/2017  . Lactic acidosis 04/15/2017  . Acute on chronic diastolic CHF (congestive heart failure) ([place]) 06/19/2015  . SOB (shortness of breath) 06/09/2015  . PAF (paroxysmal atrial fibrillation) ([place])   . Derangement of medial meniscus, posterior horn 12/12/2014  . Knee strain 12/02/2014  . Arthritis of knee, degenerative 12/02/2014  . Acquired hypothyroidism 10/14/2014  . Type 2 diabetes mellitus ([place]) 04/08/2014  . Personal history of malignant neoplasm of breast 04/08/2014  . BP (high blood pressure) 04/08/2014  . Paroxysmal atrial fibrillation ([place]) 04/08/2014  . Cough 08/14/2011  . Hyperlipidemia 02/05/2011  . HTN (hypertension) 02/05/2011  . Atrial fibrillation ([place]) 02/05/2011  . Diabetes mellitus ([place]) 02/05/2011    Past Surgical History:  Procedure Laterality Date  . BREAST LUMPECTOMY  12/1996   left   . CATARACT EXTRACTION  2000   left  . CATARACT EXTRACTION  11/2010   right  . ELECTROPHYSIOLOGIC STUDY N/A 06/13/2015   Procedure: Cardioversion;  Surgeon: Minna Merritts, MD;  Location: [facility] ORS;  Service: Cardiovascular;  Laterality: N/A;  . radioactive ablation   1999   thyroid glan  . SPLENECTOMY      Prior to Admission medications   Medication Sig Start Date End Date Taking? Authorizing Provider  Cholecalciferol (VITAMIN D3) 1000 UNITS CAPS Take 1,000 Units by mouth daily.    Yes [provider]  ELIQUIS 5 MG TABS tablet TAKE 1 TABLET (5 MG TOTAL)  BY MOUTH 2 (TWO) TIMES DAILY. 01/28/17  Yes Gollan, Kathlene November, MD  escitalopram (LEXAPRO) 5 MG tablet Take 5 mg by mouth daily. 04/14/17  Yes [provider]  levothyroxine (SYNTHROID, LEVOTHROID) 112 MCG tablet Take 112 mcg by mouth every morning. 01/07/17  Yes [provider]  metFORMIN (GLUCOPHAGE) 500 MG tablet Take 1,000 mg by mouth 2 (two) times daily. 07/11/15  Yes [provider]  ramipril (ALTACE) 10 MG capsule TAKE 1 CAPSULE (10 MG TOTAL) BY MOUTH DAILY. 12/09/16  Yes Gollan, Kathlene November, MD  vitamin B-12 (CYANOCOBALAMIN) 1000 MCG tablet Take 1,000 mcg by mouth daily.   Yes [provider]  zolpidem (AMBIEN) 5 MG tablet Take 2.5-5 mg by mouth at bedtime as needed for sleep. 04/11/17  Yes [provider]  atorvastatin (LIPITOR) 10 MG tablet Take 1 tablet (10 mg total) by mouth daily. 04/17/17 04/17/18  Max Sane, MD  diltiazem (CARDIZEM CD) 120 MG 24 hr capsule Take 1 capsule (120 mg total) by mouth daily. 04/17/17 04/17/18  Max Sane, MD  timolol (TIMOPTIC) 0.5 % ophthalmic solution 1 drop daily.     [provider]    Allergies Patient has no known allergies.  Family History  Problem Relation Age of Onset  . Bladder Cancer Neg Hx   . Kidney cancer Neg Hx     Social History Social History  Substance Use Topics  . Smoking status: Never Smoker  . Smokeless tobacco: Never Used  . Alcohol use No    Review of Systems  Constitutional: Negative for fever. + Generalized weakness, lightheadedness Eyes: Negative for visual changes. ENT: Negative for sore throat. Neck: No neck pain  Cardiovascular: + chest pain. Respiratory: Negative for shortness of breath. Gastrointestinal: Negative for abdominal pain, vomiting or diarrhea. Genitourinary: Negative for dysuria. Musculoskeletal: Negative for back pain. Skin: Negative for rash. Neurological: Negative for headaches, weakness or numbness. Psych: No SI or HI  ____________________________________________   PHYSICAL EXAM:  VITAL SIGNS: ED Triage Vitals  Enc Vitals Group     BP 04/15/17 1316 (!) 113/58     Pulse Rate 04/15/17 1316 (!) 39     Resp 04/15/17 1316 14     Temp 04/15/17 1316 98.5 F (36.9 C)     Temp Source 04/15/17 1316 Oral     SpO2  04/15/17 1316 94 %     Weight 04/15/17 1318 170 lb (77.1 kg)     Height 04/15/17 1318 5\' 5"  (1.651 m)     Head Circumference --      Peak Flow --      Pain Score 04/15/17 1316 2     Pain Loc --      Pain Edu? --      Excl. in [place]? --     Constitutional: Alert and oriented. Well appearing and in no apparent distress. HEENT:      Head: Normocephalic and atraumatic.         Eyes: Conjunctivae are normal. Sclera is non-icteric.       Mouth/Throat: Mucous membranes are moist.       Neck: Supple with no signs of meningismus. Cardiovascular: Bradycardic with regular rhythm. No murmurs, gallops, or rubs. 2+ symmetrical distal pulses are present in all extremities. No JVD. Respiratory: Normal respiratory effort. Lungs are clear to auscultation bilaterally. No wheezes, crackles, or rhonchi.  Gastrointestinal: Soft, non tender, and non distended with positive bowel sounds. No rebound or guarding. Musculoskeletal: Nontender with normal range of motion in  all extremities. No edema, cyanosis, or erythema of extremities. Neurologic: Normal speech and language.A & O x3, PERRL, no nystagmus, CN II-XII intact, motor testing reveals good tone and bulk throughout. There is no evidence of pronator drift or dysmetria. Muscle strength is 5/5 throughout.  Sensory examination is intact. Gait is normal. Skin: Skin is warm, dry and intact. No rash noted. Psychiatric: Mood and affect are normal. Speech and behavior are normal.  ____________________________________________   LABS (all labs ordered are listed, but only abnormal results are displayed)  Labs Reviewed  BASIC METABOLIC PANEL - Abnormal; Notable for the following:       Result Value   CO2 21 (*)    Glucose, Bld 151 (*)    BUN 29 (*)    Creatinine, Ser 1.31 (*)    GFR calc non Af Amer 35 (*)    GFR calc Af Amer 41 (*)    All other components within normal limits  CBC - Abnormal; Notable for the following:    WBC 12.7 (*)    Hemoglobin 11.2 (*)     HCT 34.3 (*)    RDW 16.3 (*)    All other components within normal limits  PROTIME-INR - Abnormal; Notable for the following:    Prothrombin Time 19.9 (*)    All other components within normal limits  MAGNESIUM - Abnormal; Notable for the following:    Magnesium 1.6 (*)    All other components within normal limits  LACTIC ACID, PLASMA - Abnormal; Notable for the following:    Lactic Acid, Venous 2.6 (*)    All other components within normal limits  LACTIC ACID, PLASMA - Abnormal; Notable for the following:    Lactic Acid, Venous 4.5 (*)    All other components within normal limits  URINALYSIS, COMPLETE (UACMP) WITH MICROSCOPIC - Abnormal; Notable for the following:    Color, Urine YELLOW (*)    APPearance HAZY (*)    Protein, ur 30 (*)    Leukocytes, UA MODERATE (*)    Squamous Epithelial / LPF 0-5 (*)    Non Squamous Epithelial 0-5 (*)    All other components within normal limits  BASIC METABOLIC PANEL - Abnormal; Notable for the following:    CO2 21 (*)    Glucose, Bld 159 (*)    BUN 28 (*)    Creatinine, Ser 1.01 (*)    GFR calc non Af Amer 48 (*)    GFR calc Af Amer 56 (*)    All other components within normal limits  CBC - Abnormal; Notable for the following:    WBC 14.1 (*)    Hemoglobin 10.8 (*)    HCT 33.2 (*)    RDW 16.2 (*)    All other components within normal limits  DIGOXIN LEVEL - Abnormal; Notable for the following:    Digoxin Level <0.2 (*)    All other components within normal limits  HEPARIN LEVEL (UNFRACTIONATED) - Abnormal; Notable for the following:    Heparin Unfractionated >3.60 (*)    All other components within normal limits  APTT - Abnormal; Notable for the following:    aPTT 78 (*)    All other components within normal limits  HEPARIN LEVEL (UNFRACTIONATED) - Abnormal; Notable for the following:    Heparin Unfractionated 3.26 (*)    All other components within normal limits  LACTIC ACID, PLASMA - Abnormal; Notable for the following:     Lactic Acid, Venous 3.4 (*)    All  other components within normal limits  LACTIC ACID, PLASMA - Abnormal; Notable for the following:    Lactic Acid, Venous 2.7 (*)    All other components within normal limits  GLUCOSE, CAPILLARY - Abnormal; Notable for the following:    Glucose-Capillary 161 (*)    All other components within normal limits  LACTIC ACID, PLASMA - Abnormal; Notable for the following:    Lactic Acid, Venous 2.5 (*)    All other components within normal limits  LACTIC ACID, PLASMA - Abnormal; Notable for the following:    Lactic Acid, Venous 2.2 (*)    All other components within normal limits  GLUCOSE, CAPILLARY - Abnormal; Notable for the following:    Glucose-Capillary 133 (*)    All other components within normal limits  BASIC METABOLIC PANEL - Abnormal; Notable for the following:    Glucose, Bld 147 (*)    All other components within normal limits  CBC - Abnormal; Notable for the following:    WBC 13.1 (*)    Hemoglobin 11.4 (*)    HCT 33.9 (*)    RDW 16.1 (*)    All other components within normal limits  GLUCOSE, CAPILLARY - Abnormal; Notable for the following:    Glucose-Capillary 215 (*)    All other components within normal limits  GLUCOSE, CAPILLARY - Abnormal; Notable for the following:    Glucose-Capillary 162 (*)    All other components within normal limits  GLUCOSE, CAPILLARY - Abnormal; Notable for the following:    Glucose-Capillary 169 (*)    All other components within normal limits  TROPONIN I  TSH  TROPONIN I  TROPONIN I  APTT  TROPONIN I  MAGNESIUM   ____________________________________________  EKG  ED ECG REPORT I, Rudene Re, the attending physician, personally viewed and interpreted this ECG.  Junctional rhythm, rate of 39, left bundle branch block, no ST elevations or depressions. ____________________________________________  RADIOLOGY  CXR: No acute abnormality  noted ____________________________________________   PROCEDURES  Procedure(s) performed: None Procedures Critical Care performed:  None ____________________________________________   INITIAL IMPRESSION / ASSESSMENT AND PLAN / ED COURSE  83 y.o. female with a known history of Diabetes mellitus, glaucoma, breast cancer, ITP, hyperlipidemia, hypertension, hypothyroidism, paroxysmal atrial fibrillation, recent diagnosis of depression, who presents to the hospital with complaints of lightheadedness and generalized weakness. Patient found to be in a junctional rhythm and bradycardic to the mid 30s. Labs showing acute kidney injury with creatinine of 1.31 for which patient was given IV fluids. Patient with low magnesium which was supplemented. Discussed with dr. Saunders Revel who recommended admission for IV hydration and monitoring. No intervention since patient has good mental status and normal BP.      Pertinent labs & imaging results that were available during my care of the patient were reviewed by me and considered in my medical decision making (see chart for details).    ____________________________________________   FINAL CLINICAL IMPRESSION(S) / ED DIAGNOSES  Final diagnoses:  Bradycardia  Hypomagnesemia  AKI (acute kidney injury) ([place])      NEW MEDICATIONS STARTED DURING THIS VISIT:  Discharge Medication List as of 04/17/2017  4:53 PM    START taking these medications   Details  atorvastatin (LIPITOR) 10 MG tablet Take 1 tablet (10 mg total) by mouth daily., Starting Thu 04/17/2017, Until Fri 04/17/2018, Normal    diltiazem (CARDIZEM CD) 120 MG 24 hr capsule Take 1 capsule (120 mg total) by mouth daily., Starting Thu 04/17/2017, Until Fri 04/17/2018, Normal  Note:  This document was prepared using Dragon voice recognition software and may include unintentional dictation errors.    Rudene Re, MD 04/19/17 [phone]

## 2017-04-23 ENCOUNTER — Ambulatory Visit: Payer: PPO | Admitting: Physician Assistant

## 2017-04-25 DIAGNOSIS — K625 Hemorrhage of anus and rectum: Secondary | ICD-10-CM | POA: Diagnosis not present

## 2017-04-25 DIAGNOSIS — R5383 Other fatigue: Secondary | ICD-10-CM | POA: Diagnosis not present

## 2017-04-25 DIAGNOSIS — I480 Paroxysmal atrial fibrillation: Secondary | ICD-10-CM | POA: Diagnosis not present

## 2017-04-25 DIAGNOSIS — K648 Other hemorrhoids: Secondary | ICD-10-CM | POA: Diagnosis not present

## 2017-04-28 DIAGNOSIS — I10 Essential (primary) hypertension: Secondary | ICD-10-CM | POA: Diagnosis not present

## 2017-04-28 DIAGNOSIS — E119 Type 2 diabetes mellitus without complications: Secondary | ICD-10-CM | POA: Diagnosis not present

## 2017-04-28 DIAGNOSIS — R1013 Epigastric pain: Secondary | ICD-10-CM | POA: Diagnosis not present

## 2017-04-28 DIAGNOSIS — I480 Paroxysmal atrial fibrillation: Secondary | ICD-10-CM | POA: Diagnosis not present

## 2017-04-28 DIAGNOSIS — E039 Hypothyroidism, unspecified: Secondary | ICD-10-CM | POA: Diagnosis not present

## 2017-05-07 ENCOUNTER — Encounter: Payer: Self-pay | Admitting: Physician Assistant

## 2017-05-08 NOTE — Progress Notes (Signed)
Cardiology Office Note Date:  05/09/2017  Patient ID:  Rhonda, Hurley Mar 25, 1927, MRN [id number] PCP:  Juluis Pitch, MD  Cardiologist:  Dr. Rockey Situ, MD    Chief Complaint: Hospital follow up  History of Present Illness: Rhonda Hurley is a 83 y.o. female with history of PAF s/p prior DCCV on Eliquis, HTN, HLD, hypothyroidism, breast cancer s/p lumpectomy and radiation, TIA/CVA, DM2, LBBB, and valvular heart disease who presents for hospital follow up after recent admission to [facility] from 8/14-8/16 for junctional bradycardia.   Seen in the ED in early August for abdominal pain with CT negative for acute changes and troponin normal. Admitted on 8/14 with retrosternal chest tightness and weakness. She took an unknown medication that she was given a long time ago for palpitations (had never taken before). Her chest tightness persisted prompting her to come to the ED where she was noted to be in junctional bradycardia, hemodynamically stable. Patient had recently started Lexapro. Her metoprolol, flecainide, and timolol drops were held upon admission. Troponin negative x 4, TSH normal, digoxin < 0.2, magnesium 1.6-->2.0, K+ 4.4, WBC 12.7-->13.1, HGB 11.2, lactic acid 2.6--4.5-->2.7. Echo showed EF 60-65%, no RWMA, not technically sufficient to allow for LV diastolic function, mild MR, moderately dilated left atrium, RV systolic function normal, PASP 36 mmHg, junctional rhythm with a rate of 40 bpm. Her heart rate improved with holding of the above medications, with her converting back to sinus rhythm on her own. She was discharged on Cardizem CD 120 mg daily along with Eliquis. She was not continued on metoprolol or flecainide. EP evaluation was advised for further arrhythmia.  She comes in doing overall well today, though does continue to note a generalized fatigue, though improved when compared to prior to her admission. She indicates she is not back to "where I want to be." No chest pain,  DOE, palpitations, dizziness, presyncope, or syncope. Has not missed any doses of medications. Taking Cardizem CD 120 gm daily, Eliquis 5 mg bid, and timolol eye drops. [place] is working on getting HHPT (may need an order, she is not certain). Appetite still not back to baseline. Family present with her today feels like the patient is improving, though not yet back to baseline.    Past Medical History:  Diagnosis Date  . Acute cerebrovascular accident [facility])    a. 10/2012: MRI concern for posible small acute infarct along the left parietal cortex  . Chronic diastolic CHF (congestive heart failure) ([place])    a. 03/2007 Echo: nl EF, mod MR,mild TR, Diast dysfxn; b. echo 10/2012: EF 50-55%, possible HK of anterior & anteroseptal wall, mild to mod LVH, nl RVSP.  Marland Kitchen Derangement of posterior horn of medial meniscus   . Essential hypertension 04/08/2014  . Junctional bradycardia    a. admitted 04/2017 w/ junctional bradycardia; b. rates improved w/ holding metoprolol, flecainide, & timolol; c. discharged on Cardizem CD 120 mg daily & Eliquis  . LBBB (left bundle branch block)   . Mitral valve disorders(424.0)    a. echo 03/2007: nl LVEF, mod MR, mild TR, DD; b. echo 10/2012: EF 50-55%, possible HK of anterior & anteroseptal wall, mild to mod LVH, nl RVSP; c. TTE 8/18: EF 60-65%, no RWMA, mild MR, mod dilated LA, RV sys fxn nl, PASP 36    . Other and unspecified hyperlipidemia   . Paroxysmal atrial fibrillation (HCC)    a. CHA2DS2VASc = 8-->eliquis;  b. Managed w/ flecainide and BB therapy.  Marland Kitchen TIA (  transient ischemic attack) 2014  . Type II or unspecified type diabetes mellitus without mention of complication, not stated as uncontrolled   . Unspecified hypothyroidism     Past Surgical History:  Procedure Laterality Date  . BREAST LUMPECTOMY  12/1996   left   . CATARACT EXTRACTION  2000   left  . CATARACT EXTRACTION  11/2010   right  . ELECTROPHYSIOLOGIC STUDY N/A 06/13/2015   Procedure:  Cardioversion;  Surgeon: Minna Merritts, MD;  Location: [facility] ORS;  Service: Cardiovascular;  Laterality: N/A;  . radioactive ablation   1999   thyroid glan  . SPLENECTOMY      Current Meds  Medication Sig  . atorvastatin (LIPITOR) 10 MG tablet Take 1 tablet (10 mg total) by mouth daily.  . Cholecalciferol (VITAMIN D3) 1000 UNITS CAPS Take 1,000 Units by mouth daily.   Marland Kitchen diltiazem (CARDIZEM CD) 120 MG 24 hr capsule Take 1 capsule (120 mg total) by mouth daily.  Marland Kitchen ELIQUIS 5 MG TABS tablet TAKE 1 TABLET (5 MG TOTAL) BY MOUTH 2 (TWO) TIMES DAILY.  Marland Kitchen escitalopram (LEXAPRO) 10 MG tablet Take 10 mg by mouth daily.   Marland Kitchen levothyroxine (SYNTHROID, LEVOTHROID) 112 MCG tablet Take 112 mcg by mouth every morning.  . magnesium oxide (MAG-OX) 400 MG tablet Take 400 mg by mouth daily.   . metFORMIN (GLUCOPHAGE) 500 MG tablet Take 1,000 mg by mouth 2 (two) times daily.  . ramipril (ALTACE) 10 MG capsule TAKE 1 CAPSULE (10 MG TOTAL) BY MOUTH DAILY.  Marland Kitchen timolol (TIMOPTIC) 0.5 % ophthalmic solution 1 drop daily.   . vitamin B-12 (CYANOCOBALAMIN) 1000 MCG tablet Take 1,000 mcg by mouth daily.  Marland Kitchen zolpidem (AMBIEN) 5 MG tablet Take 2.5-5 mg by mouth at bedtime as needed for sleep.  Dictation #1 [id number]  [id number]   Allergies:   Patient has no known allergies.   Social History:  The patient  reports that she has never smoked. She has never used smokeless tobacco. She reports that she does not drink alcohol or use drugs.   Family History:  The patient's family history is not on file.  ROS:   Review of Systems  Constitutional: Positive for malaise/fatigue. Negative for chills, diaphoresis, fever and weight loss.  HENT: Negative for congestion.   Eyes: Negative for discharge and redness.  Respiratory: Negative for cough, hemoptysis, sputum production, shortness of breath and wheezing.   Cardiovascular: Negative for chest pain, palpitations, orthopnea, claudication, leg swelling and PND.    Gastrointestinal: Negative for abdominal pain, blood in stool, heartburn, melena, nausea and vomiting.  Genitourinary: Negative for hematuria.  Musculoskeletal: Negative for falls and myalgias.  Skin: Negative for rash.  Neurological: Positive for weakness. Negative for dizziness, tingling, tremors, sensory change, speech change, focal weakness and loss of consciousness.  Endo/Heme/Allergies: Does not bruise/bleed easily.  Psychiatric/Behavioral: Negative for substance abuse. The patient is not nervous/anxious.   All other systems reviewed and are negative.    PHYSICAL EXAM:  VS:  BP (!) 144/60 (BP Location: Right Arm, Patient Position: Sitting, Cuff Size: Normal)   Pulse 81   Ht 5\' 3"  (1.6 m)   Wt 154 lb (69.9 kg)   BMI 27.28 kg/m  BMI: Body mass index is 27.28 kg/m.  Physical Exam  Constitutional: She is oriented to person, place, and time. She appears well-developed and well-nourished.  HENT:  Head: Normocephalic and atraumatic.  Eyes: Right eye exhibits no discharge. Left eye exhibits no discharge.  Neck: Normal range of motion. No  JVD present.  Cardiovascular: Normal rate, S1 normal and S2 normal.  An irregular rhythm present. Exam reveals no distant heart sounds, no friction rub, no midsystolic click and no opening snap.   Murmur heard. High-pitched blowing holosystolic murmur is present with a grade of 1/6  at the apex Pulmonary/Chest: Effort normal and breath sounds normal. No respiratory distress. She has no decreased breath sounds. She has no wheezes. She has no rales. She exhibits no tenderness.  Abdominal: Soft. She exhibits no distension. There is no tenderness.  Musculoskeletal: She exhibits no edema.  Neurological: She is alert and oriented to person, place, and time.  Skin: Skin is warm and dry. No cyanosis. Nails show no clubbing.  Psychiatric: She has a normal mood and affect. Her speech is normal and behavior is normal. Judgment and thought content normal.      EKG:  Was ordered and interpreted by me today. Shows NSR with frequent premature supraventricular beats, 81 bpm, LBBB (known)  Recent Labs: 04/05/2017: ALT 12 04/15/2017: TSH 2.828 04/16/2017: Magnesium 2.0 04/17/2017: BUN 13; Creatinine, Ser 0.66; Hemoglobin 11.4; Platelets 343; Potassium 3.6; Sodium 140  No results found for requested labs within last 8760 hours.   CrCl cannot be calculated (Patient's most recent lab result is older than the maximum 21 days allowed.).   Wt Readings from Last 3 Encounters:  05/09/17 154 lb (69.9 kg)  04/17/17 155 lb 1.6 oz (70.4 kg)  04/04/17 170 lb (77.1 kg)     Other studies reviewed: Additional studies/records reviewed today include: summarized above  ASSESSMENT AND PLAN:  1. Junctional bradycardia/PACs: Heart rate normal today in the 80s bpm. Heart rate confirmed by manual radial pulse by myself. Continue to hold flecainide and metoprolol. Back on timolol drops. Defer management of eye drops to prescriber. Given atrial ectopy noted, continue Cardizem CD, no evidence of further bradycardic rates. Plan to have the patient wear a 48-hour Zio monitor to quantify the ectopic burden followed by referral to EP pending burden. May need further AAT, will defer to EP. Some of her ectopy may be playing a role in her generalized fatigue. I agree with getting the patient set up with HHPT. We are happy to help in any way and will look into if a referral is needed to get this started.   2. PAF: Currently in sinus rhythm with frequent PACs with a heart rate of 81 bpm. Continue Cardizem CD 120 mg daily for rate control. CHADS2VASc at least 8 (CHF, HTN, age x 2, DM, stroke/TIA x 2, sex category). Continue Eliquis 5 mg bid (does not meet 2/3 reduced dosing criteria).   3. HTN: BP reasonably controlled. Continue current medications.    4. HLD: Continue Lipitor 10 mg daily. Lipid panel from 10/2016 with LDL of 75. Followed by PCP.   5. Valvular heart disease: Mild MR  by echo in 04/2017. Periodic outpatient echoes.    Disposition: F/u with Dr. Rockey Situ in 3 months. Referral made to EP as above.   Current medicines are reviewed at length with the patient today.  The patient did not have any concerns regarding medicines.  Melvern Banker PA-C 05/09/2017 2:53 PM     [place] [address] [place], [place] [address] [phone]

## 2017-05-09 ENCOUNTER — Ambulatory Visit (INDEPENDENT_AMBULATORY_CARE_PROVIDER_SITE_OTHER): Payer: PPO

## 2017-05-09 ENCOUNTER — Encounter: Payer: Self-pay | Admitting: Physician Assistant

## 2017-05-09 ENCOUNTER — Ambulatory Visit (INDEPENDENT_AMBULATORY_CARE_PROVIDER_SITE_OTHER): Payer: PPO | Admitting: Physician Assistant

## 2017-05-09 VITALS — BP 144/60 | HR 81 | Ht 63.0 in | Wt 154.0 lb

## 2017-05-09 DIAGNOSIS — I493 Ventricular premature depolarization: Secondary | ICD-10-CM

## 2017-05-09 DIAGNOSIS — R2681 Unsteadiness on feet: Secondary | ICD-10-CM | POA: Diagnosis not present

## 2017-05-09 DIAGNOSIS — R42 Dizziness and giddiness: Secondary | ICD-10-CM

## 2017-05-09 DIAGNOSIS — I480 Paroxysmal atrial fibrillation: Secondary | ICD-10-CM | POA: Diagnosis not present

## 2017-05-09 DIAGNOSIS — E782 Mixed hyperlipidemia: Secondary | ICD-10-CM

## 2017-05-09 DIAGNOSIS — R5381 Other malaise: Secondary | ICD-10-CM

## 2017-05-09 DIAGNOSIS — R001 Bradycardia, unspecified: Secondary | ICD-10-CM

## 2017-05-09 DIAGNOSIS — I10 Essential (primary) hypertension: Secondary | ICD-10-CM

## 2017-05-09 DIAGNOSIS — I491 Atrial premature depolarization: Secondary | ICD-10-CM | POA: Diagnosis not present

## 2017-05-09 NOTE — Patient Instructions (Addendum)
Medication Instructions:  Your physician recommends that you continue on your current medications as directed. Please refer to the Current Medication list given to you today.   Labwork: none  Testing/Procedures: Your physician has recommended that you wear an 48 HOUR event monitor. Event monitors are medical devices that record the heart's electrical activity. Doctors most often [place] these monitors to diagnose arrhythmias. Arrhythmias are problems with the speed or rhythm of the heartbeat. The monitor is a small, portable device. You can wear one while you do your normal daily activities. This is usually used to diagnose what is causing palpitations/syncope (passing out).  A Zio Patch Event Heart monitor will be applied to your chest today.  You will wear the patch for 48 HOURS. After 24 hours, you may shower with the heart monitor on. If you feel any SYMPTOMS, you may press and release the button in the middle of the monitor.  REMOVE ON 05/11/17 AT 3:30 PM.    Follow-Up: You have been referred to [facility]. Someone should be contacting you to set this up.  You have been referred to DR Buchanan.  Your physician recommends that you schedule a follow-up appointment in: [place].   If you need a refill on your cardiac medications before your next appointment, please call your pharmacy.   Medication Samples have been provided to the patient.  Drug name: ELIQUIS       Strength: 5 MG        Qty: 2 BOXES  LOT: [id number]  Exp.Date: 08/2019

## 2017-05-12 DIAGNOSIS — R7309 Other abnormal glucose: Secondary | ICD-10-CM | POA: Diagnosis not present

## 2017-05-12 DIAGNOSIS — R790 Abnormal level of blood mineral: Secondary | ICD-10-CM | POA: Diagnosis not present

## 2017-05-12 DIAGNOSIS — D72829 Elevated white blood cell count, unspecified: Secondary | ICD-10-CM | POA: Diagnosis not present

## 2017-05-13 ENCOUNTER — Telehealth: Payer: Self-pay | Admitting: *Deleted

## 2017-05-13 NOTE — Telephone Encounter (Signed)
Received incoming call from [facility].  She said they do offer PT services for the patient in their home. She needs order, office visit note with med list included faxed to [phone] and she will get the process started.

## 2017-05-13 NOTE — Telephone Encounter (Signed)
Order for PT at [facility] for dizziness, unsteady gait, and decondition signed by Christell Faith, PA and faxed. Office visit note and med list also sent to [phone].  Daughter notified that order has been faxed. She was very [employer] attorney.

## 2017-05-13 NOTE — Telephone Encounter (Signed)
Patient unable to receive [facility] PT via [place] r/t insurance copay.  Patient lives at [facility] where they offer the service. Left message with [facility] Therapy coordinator to call back to discuss.

## 2017-05-14 ENCOUNTER — Inpatient Hospital Stay: Payer: PPO

## 2017-05-14 ENCOUNTER — Encounter: Payer: Self-pay | Admitting: Emergency Medicine

## 2017-05-14 ENCOUNTER — Institutional Professional Consult (permissible substitution): Payer: PPO | Admitting: Radiation Oncology

## 2017-05-14 ENCOUNTER — Inpatient Hospital Stay
Admission: EM | Admit: 2017-05-14 | Discharge: 2017-05-16 | DRG: 054 | Disposition: A | Discharge status: Home Health | Payer: PPO | Attending: Internal Medicine | Admitting: Internal Medicine

## 2017-05-14 ENCOUNTER — Emergency Department: Payer: PPO

## 2017-05-14 DIAGNOSIS — H53462 Homonymous bilateral field defects, left side: Secondary | ICD-10-CM | POA: Diagnosis not present

## 2017-05-14 DIAGNOSIS — Z9081 Acquired absence of spleen: Secondary | ICD-10-CM | POA: Diagnosis not present

## 2017-05-14 DIAGNOSIS — Z8673 Personal history of transient ischemic attack (TIA), and cerebral infarction without residual deficits: Secondary | ICD-10-CM | POA: Diagnosis not present

## 2017-05-14 DIAGNOSIS — I447 Left bundle-branch block, unspecified: Secondary | ICD-10-CM

## 2017-05-14 DIAGNOSIS — E039 Hypothyroidism, unspecified: Secondary | ICD-10-CM | POA: Diagnosis not present

## 2017-05-14 DIAGNOSIS — Z79899 Other long term (current) drug therapy: Secondary | ICD-10-CM

## 2017-05-14 DIAGNOSIS — R51 Headache: Secondary | ICD-10-CM | POA: Diagnosis not present

## 2017-05-14 DIAGNOSIS — W1811XA Fall from or off toilet without subsequent striking against object, initial encounter: Secondary | ICD-10-CM | POA: Diagnosis present

## 2017-05-14 DIAGNOSIS — Z66 Do not resuscitate: Secondary | ICD-10-CM | POA: Diagnosis present

## 2017-05-14 DIAGNOSIS — I482 Chronic atrial fibrillation: Secondary | ICD-10-CM | POA: Diagnosis not present

## 2017-05-14 DIAGNOSIS — R402142 Coma scale, eyes open, spontaneous, at arrival to emergency department: Secondary | ICD-10-CM | POA: Diagnosis present

## 2017-05-14 DIAGNOSIS — I059 Rheumatic mitral valve disease, unspecified: Secondary | ICD-10-CM | POA: Diagnosis not present

## 2017-05-14 DIAGNOSIS — G939 Disorder of brain, unspecified: Secondary | ICD-10-CM | POA: Diagnosis not present

## 2017-05-14 DIAGNOSIS — R402362 Coma scale, best motor response, obeys commands, at arrival to emergency department: Secondary | ICD-10-CM | POA: Diagnosis present

## 2017-05-14 DIAGNOSIS — R109 Unspecified abdominal pain: Secondary | ICD-10-CM

## 2017-05-14 DIAGNOSIS — G936 Cerebral edema: Secondary | ICD-10-CM | POA: Diagnosis present

## 2017-05-14 DIAGNOSIS — I4891 Unspecified atrial fibrillation: Secondary | ICD-10-CM

## 2017-05-14 DIAGNOSIS — R402252 Coma scale, best verbal response, oriented, at arrival to emergency department: Secondary | ICD-10-CM | POA: Diagnosis present

## 2017-05-14 DIAGNOSIS — R918 Other nonspecific abnormal finding of lung field: Secondary | ICD-10-CM | POA: Diagnosis not present

## 2017-05-14 DIAGNOSIS — I110 Hypertensive heart disease with heart failure: Secondary | ICD-10-CM | POA: Diagnosis not present

## 2017-05-14 DIAGNOSIS — E119 Type 2 diabetes mellitus without complications: Secondary | ICD-10-CM | POA: Diagnosis present

## 2017-05-14 DIAGNOSIS — D693 Immune thrombocytopenic purpura: Secondary | ICD-10-CM | POA: Diagnosis not present

## 2017-05-14 DIAGNOSIS — R001 Bradycardia, unspecified: Secondary | ICD-10-CM | POA: Diagnosis not present

## 2017-05-14 DIAGNOSIS — E860 Dehydration: Secondary | ICD-10-CM | POA: Diagnosis not present

## 2017-05-14 DIAGNOSIS — I5032 Chronic diastolic (congestive) heart failure: Secondary | ICD-10-CM | POA: Diagnosis not present

## 2017-05-14 DIAGNOSIS — Z853 Personal history of malignant neoplasm of breast: Secondary | ICD-10-CM | POA: Diagnosis not present

## 2017-05-14 DIAGNOSIS — Z9221 Personal history of antineoplastic chemotherapy: Secondary | ICD-10-CM

## 2017-05-14 DIAGNOSIS — Z9181 History of falling: Secondary | ICD-10-CM

## 2017-05-14 DIAGNOSIS — I10 Essential (primary) hypertension: Secondary | ICD-10-CM

## 2017-05-14 DIAGNOSIS — E785 Hyperlipidemia, unspecified: Secondary | ICD-10-CM | POA: Diagnosis not present

## 2017-05-14 DIAGNOSIS — Z7901 Long term (current) use of anticoagulants: Secondary | ICD-10-CM | POA: Diagnosis not present

## 2017-05-14 DIAGNOSIS — R269 Unspecified abnormalities of gait and mobility: Secondary | ICD-10-CM | POA: Diagnosis not present

## 2017-05-14 DIAGNOSIS — R5383 Other fatigue: Secondary | ICD-10-CM

## 2017-05-14 DIAGNOSIS — S4992XA Unspecified injury of left shoulder and upper arm, initial encounter: Secondary | ICD-10-CM | POA: Diagnosis not present

## 2017-05-14 DIAGNOSIS — Y92091 Bathroom in other non-institutional residence as the place of occurrence of the external cause: Secondary | ICD-10-CM | POA: Diagnosis not present

## 2017-05-14 DIAGNOSIS — R2681 Unsteadiness on feet: Secondary | ICD-10-CM

## 2017-05-14 DIAGNOSIS — Z7984 Long term (current) use of oral hypoglycemic drugs: Secondary | ICD-10-CM | POA: Diagnosis not present

## 2017-05-14 DIAGNOSIS — C7931 Secondary malignant neoplasm of brain: Secondary | ICD-10-CM | POA: Diagnosis not present

## 2017-05-14 DIAGNOSIS — S0990XA Unspecified injury of head, initial encounter: Secondary | ICD-10-CM | POA: Diagnosis not present

## 2017-05-14 DIAGNOSIS — S80212A Abrasion, left knee, initial encounter: Secondary | ICD-10-CM | POA: Diagnosis present

## 2017-05-14 DIAGNOSIS — R278 Other lack of coordination: Secondary | ICD-10-CM | POA: Diagnosis not present

## 2017-05-14 DIAGNOSIS — S0083XA Contusion of other part of head, initial encounter: Secondary | ICD-10-CM | POA: Diagnosis present

## 2017-05-14 DIAGNOSIS — S199XXA Unspecified injury of neck, initial encounter: Secondary | ICD-10-CM | POA: Diagnosis not present

## 2017-05-14 DIAGNOSIS — C719 Malignant neoplasm of brain, unspecified: Secondary | ICD-10-CM | POA: Diagnosis not present

## 2017-05-14 HISTORY — DX: Malignant (primary) neoplasm, unspecified: C80.1

## 2017-05-14 LAB — CBC WITH DIFFERENTIAL/PLATELET
Basophils Absolute: 0 10*3/uL (ref 0–0.1)
Basophils Relative: 0 %
EOS ABS: 0 10*3/uL (ref 0–0.7)
Eosinophils Relative: 0 %
HEMATOCRIT: 33.2 % — AB (ref 35.0–47.0)
HEMOGLOBIN: 11.1 g/dL — AB (ref 12.0–16.0)
LYMPHS ABS: 1.2 10*3/uL (ref 1.0–3.6)
LYMPHS PCT: 9 %
MCH: 29.3 pg (ref 26.0–34.0)
MCHC: 33.4 g/dL (ref 32.0–36.0)
MCV: 87.5 fL (ref 80.0–100.0)
MONOS PCT: 9 %
Monocytes Absolute: 1.1 10*3/uL — ABNORMAL HIGH (ref 0.2–0.9)
NEUTROS ABS: 10.4 10*3/uL — AB (ref 1.4–6.5)
NEUTROS PCT: 82 %
Platelets: 351 10*3/uL (ref 150–440)
RBC: 3.79 MIL/uL — AB (ref 3.80–5.20)
RDW: 15.6 % — ABNORMAL HIGH (ref 11.5–14.5)
WBC: 12.7 10*3/uL — AB (ref 3.6–11.0)

## 2017-05-14 LAB — GLUCOSE, CAPILLARY
GLUCOSE-CAPILLARY: 136 mg/dL — AB (ref 65–99)
GLUCOSE-CAPILLARY: 291 mg/dL — AB (ref 65–99)
Glucose-Capillary: 188 mg/dL — ABNORMAL HIGH (ref 65–99)

## 2017-05-14 LAB — COMPREHENSIVE METABOLIC PANEL
ALT: 11 U/L — AB (ref 14–54)
ANION GAP: 8 (ref 5–15)
AST: 19 U/L (ref 15–41)
Albumin: 3.3 g/dL — ABNORMAL LOW (ref 3.5–5.0)
Alkaline Phosphatase: 74 U/L (ref 38–126)
BILIRUBIN TOTAL: 1.1 mg/dL (ref 0.3–1.2)
BUN: 16 mg/dL (ref 6–20)
CALCIUM: 9.5 mg/dL (ref 8.9–10.3)
CO2: 26 mmol/L (ref 22–32)
CREATININE: 0.6 mg/dL (ref 0.44–1.00)
Chloride: 104 mmol/L (ref 101–111)
GFR calc non Af Amer: >60 mL/min (ref >60)
GLUCOSE: 124 mg/dL — AB (ref 65–99)
Potassium: 3.7 mmol/L (ref 3.5–5.1)
SODIUM: 138 mmol/L (ref 135–145)
TOTAL PROTEIN: 6.3 g/dL — AB (ref 6.5–8.1)

## 2017-05-14 LAB — BASIC METABOLIC PANEL
ANION GAP: 9 (ref 5–15)
BUN: 15 mg/dL (ref 6–20)
CALCIUM: 9.6 mg/dL (ref 8.9–10.3)
CO2: 25 mmol/L (ref 22–32)
Chloride: 103 mmol/L (ref 101–111)
Creatinine, Ser: 0.62 mg/dL (ref 0.44–1.00)
GFR calc Af Amer: >60 mL/min (ref >60)
Glucose, Bld: 146 mg/dL — ABNORMAL HIGH (ref 65–99)
POTASSIUM: 3.7 mmol/L (ref 3.5–5.1)
SODIUM: 137 mmol/L (ref 135–145)

## 2017-05-14 LAB — PROTIME-INR
INR: 1.36
Prothrombin Time: 16.7 seconds — ABNORMAL HIGH (ref 11.4–15.2)

## 2017-05-14 LAB — APTT: aPTT: 32 seconds (ref 24–36)

## 2017-05-14 MED ORDER — GADOBENATE DIMEGLUMINE 529 MG/ML IV SOLN
15.0000 mL | Freq: Once | INTRAVENOUS | Status: AC | PRN
  Administered 2017-05-14: 14 mL via INTRAVENOUS

## 2017-05-14 MED ORDER — INSULIN ASPART 100 UNIT/ML ~~LOC~~ SOLN
0.0000 [IU] | Freq: Three times a day (TID) | SUBCUTANEOUS | Status: DC
Start: 2017-05-14 — End: 2017-05-16
  Administered 2017-05-14: 18:00:00 2 [IU] via SUBCUTANEOUS
  Administered 2017-05-14: 1 [IU] via SUBCUTANEOUS
  Administered 2017-05-15 (×2): 3 [IU] via SUBCUTANEOUS
  Administered 2017-05-15 – 2017-05-16 (×2): 7 [IU] via SUBCUTANEOUS
  Administered 2017-05-16: 3 [IU] via SUBCUTANEOUS
  Filled 2017-05-14 (×7): qty 1

## 2017-05-14 MED ORDER — DEXAMETHASONE SODIUM PHOSPHATE 4 MG/ML IJ SOLN
4.0000 mg | Freq: Four times a day (QID) | INTRAMUSCULAR | Status: DC
  Administered 2017-05-14 – 2017-05-16 (×8): 4 mg via INTRAVENOUS
  Filled 2017-05-14 (×8): qty 1

## 2017-05-14 MED ORDER — LEVOTHYROXINE SODIUM 112 MCG PO TABS
112.0000 ug | ORAL_TABLET | Freq: Every day | ORAL | Status: DC
  Administered 2017-05-15 – 2017-05-16 (×2): 112 ug via ORAL
  Filled 2017-05-14 (×3): qty 1

## 2017-05-14 MED ORDER — APIXABAN 5 MG PO TABS
5.0000 mg | ORAL_TABLET | Freq: Two times a day (BID) | ORAL | Status: DC
  Administered 2017-05-14 – 2017-05-16 (×5): 5 mg via ORAL
  Filled 2017-05-14 (×5): qty 1

## 2017-05-14 MED ORDER — ACETAMINOPHEN 650 MG RE SUPP: 650.0000 mg | Freq: Four times a day (QID) | RECTAL | Status: DC | PRN

## 2017-05-14 MED ORDER — DEXAMETHASONE SODIUM PHOSPHATE 4 MG/ML IJ SOLN
10.0000 mg | Freq: Once | INTRAMUSCULAR | Status: AC
  Administered 2017-05-14: 10 mg via INTRAVENOUS
  Filled 2017-05-14: qty 3

## 2017-05-14 MED ORDER — IOPAMIDOL (ISOVUE-300) INJECTION 61%
30.0000 mL | Freq: Once | INTRAVENOUS | Status: AC | PRN
  Administered 2017-05-14: 30 mL via ORAL

## 2017-05-14 MED ORDER — ATORVASTATIN CALCIUM 20 MG PO TABS
10.0000 mg | ORAL_TABLET | Freq: Every day | ORAL | Status: DC
  Administered 2017-05-14 – 2017-05-16 (×3): 10 mg via ORAL
  Filled 2017-05-14 (×3): qty 1

## 2017-05-14 MED ORDER — ZOLPIDEM TARTRATE 5 MG PO TABS: 2.5000 mg | ORAL_TABLET | Freq: Every evening | ORAL | Status: DC | PRN

## 2017-05-14 MED ORDER — INSULIN ASPART 100 UNIT/ML ~~LOC~~ SOLN
0.0000 [IU] | Freq: Every day | SUBCUTANEOUS | Status: DC
  Administered 2017-05-14: 3 [IU] via SUBCUTANEOUS
  Administered 2017-05-15: 22:00:00 2 [IU] via SUBCUTANEOUS
  Filled 2017-05-14 (×2): qty 1

## 2017-05-14 MED ORDER — RAMIPRIL 10 MG PO CAPS
10.0000 mg | ORAL_CAPSULE | Freq: Every day | ORAL | Status: DC
  Administered 2017-05-14 – 2017-05-16 (×3): 10 mg via ORAL
  Filled 2017-05-14 (×3): qty 1

## 2017-05-14 MED ORDER — ONDANSETRON HCL 4 MG/2ML IJ SOLN: 4.0000 mg | Freq: Four times a day (QID) | INTRAMUSCULAR | Status: DC | PRN

## 2017-05-14 MED ORDER — DILTIAZEM HCL ER COATED BEADS 120 MG PO CP24
120.0000 mg | ORAL_CAPSULE | Freq: Every day | ORAL | Status: DC
  Administered 2017-05-14 – 2017-05-16 (×3): 120 mg via ORAL
  Filled 2017-05-14 (×3): qty 1

## 2017-05-14 MED ORDER — ONDANSETRON HCL 4 MG PO TABS: 4.0000 mg | ORAL_TABLET | Freq: Four times a day (QID) | ORAL | Status: DC | PRN

## 2017-05-14 MED ORDER — ESCITALOPRAM OXALATE 10 MG PO TABS
10.0000 mg | ORAL_TABLET | Freq: Every day | ORAL | Status: DC
  Administered 2017-05-14 – 2017-05-16 (×3): 10 mg via ORAL
  Filled 2017-05-14 (×3): qty 1

## 2017-05-14 MED ORDER — IOPAMIDOL (ISOVUE-300) INJECTION 61%
75.0000 mL | Freq: Once | INTRAVENOUS | Status: AC | PRN
  Administered 2017-05-14: 14:00:00 75 mL via INTRAVENOUS

## 2017-05-14 MED ORDER — ENOXAPARIN SODIUM 40 MG/0.4ML ~~LOC~~ SOLN: 30.0000 mg | SUBCUTANEOUS | Status: DC

## 2017-05-14 MED ORDER — MAGNESIUM OXIDE 400 MG PO TABS
400.0000 mg | ORAL_TABLET | Freq: Every day | ORAL | Status: DC
  Administered 2017-05-14 – 2017-05-16 (×3): 400 mg via ORAL
  Filled 2017-05-14 (×6): qty 1

## 2017-05-14 MED ORDER — ACETAMINOPHEN 325 MG PO TABS: 650.0000 mg | ORAL_TABLET | Freq: Four times a day (QID) | ORAL | Status: DC | PRN

## 2017-05-14 MED ORDER — ORAL CARE MOUTH RINSE
15.0000 mL | Freq: Two times a day (BID) | OROMUCOSAL | Status: DC
  Administered 2017-05-14 – 2017-05-16 (×5): 15 mL via OROMUCOSAL

## 2017-05-14 NOTE — H&P (Signed)
[place] at [place] NAME: Rhonda Hurley    MR#:  [id number]  DATE OF BIRTH:  03-16-1927  DATE OF ADMISSION:  05/14/2017  PRIMARY CARE PHYSICIAN: Juluis Pitch, MD   REQUESTING/REFERRING PHYSICIAN: Rudene Re, MD  CHIEF COMPLAINT:  fall HISTORY OF PRESENT ILLNESS:  Rhonda Hurley  is a 83 y.o. female with a known history ofdiabetes mellitus, hypertension, history of breast cancer status post chemotherapy 20 years ago currently cancer free, chronic atrial fibrillation on a eliquis is brought [facility] the [facility] after she sustained a fall. CT head with no intracranial bleed but has revealed he 2 low attenuation lesions with surrounding cerebral edema but no midline shift.Patient was given Decadron 10 mg [facility] hospitalist team is called to admit the patient. Patient denies any headache or chest pain or palpitations. No weight loss either. Resting comfortably during my examination  PAST MEDICAL HISTORY:   Past Medical History:  Diagnosis Date  . Acute cerebrovascular accident [facility])    a. 10/2012: MRI concern for posible small acute infarct along the left parietal cortex  . Chronic diastolic CHF (congestive heart failure) ([place])    a. 03/2007 Echo: nl EF, mod MR,mild TR, Diast dysfxn; b. echo 10/2012: EF 50-55%, possible HK of anterior & anteroseptal wall, mild to mod LVH, nl RVSP.  Marland Kitchen Derangement of posterior horn of medial meniscus   . Essential hypertension 04/08/2014  . Junctional bradycardia    a. admitted 04/2017 w/ junctional bradycardia; b. rates improved w/ holding metoprolol, flecainide, & timolol; c. discharged on Cardizem CD 120 mg daily & Eliquis  . LBBB (left bundle branch block)   . Mitral valve disorders(424.0)    a. echo 03/2007: nl LVEF, mod MR, mild TR, DD; b. echo 10/2012: EF 50-55%, possible HK of anterior & anteroseptal wall, mild to mod LVH, nl RVSP; c. TTE 8/18: EF  60-65%, no RWMA, mild MR, mod dilated LA, RV sys fxn nl, PASP 36    . Other and unspecified hyperlipidemia   . Paroxysmal atrial fibrillation (HCC)    a. CHA2DS2VASc = 8-->eliquis;  b. Managed w/ flecainide and BB therapy.  Marland Kitchen TIA (transient ischemic attack) 2014  . Type II or unspecified type diabetes mellitus without mention of complication, not stated as uncontrolled   . Unspecified hypothyroidism     PAST SURGICAL HISTOIRY:   Past Surgical History:  Procedure Laterality Date  . BREAST LUMPECTOMY  12/1996   left   . CATARACT EXTRACTION  2000   left  . CATARACT EXTRACTION  11/2010   right  . ELECTROPHYSIOLOGIC STUDY N/A 06/13/2015   Procedure: Cardioversion;  Surgeon: Minna Merritts, MD;  Location: [facility] ORS;  Service: Cardiovascular;  Laterality: N/A;  . radioactive ablation   1999   thyroid glan  . SPLENECTOMY      SOCIAL HISTORY:   Social History  Substance Use Topics  . Smoking status: Never Smoker  . Smokeless tobacco: Never Used  . Alcohol use No    FAMILY HISTORY:   Family History  Problem Relation Age of Onset  . Bladder Cancer Neg Hx   . Kidney cancer Neg Hx     DRUG ALLERGIES:  No Known Allergies  REVIEW OF SYSTEMS:  CONSTITUTIONAL: No fever, fatigue or weakness.  EYES: No blurred or double vision.  EARS, NOSE, AND THROAT: No tinnitus or ear pain.  RESPIRATORY: No cough, shortness of breath, wheezing or hemoptysis.  CARDIOVASCULAR: No  chest pain, orthopnea, edema.  GASTROINTESTINAL: No nausea, vomiting, diarrhea or abdominal pain.  GENITOURINARY: No dysuria, hematuria.  ENDOCRINE: No polyuria, nocturia,  HEMATOLOGY: No anemia, easy bruising or bleeding SKIN: No rash or lesion. MUSCULOSKELETAL: No joint pain or arthritis.   NEUROLOGIC: No tingling, numbness, weakness.  PSYCHIATRY: No anxiety or depression.   MEDICATIONS AT HOME:   Prior to Admission medications   Medication Sig Start Date End Date Taking? Authorizing Provider  atorvastatin  (LIPITOR) 10 MG tablet Take 1 tablet (10 mg total) by mouth daily. 04/17/17 04/17/18 Yes Max Sane, MD  diltiazem (CARDIZEM CD) 120 MG 24 hr capsule Take 1 capsule (120 mg total) by mouth daily. 04/17/17 04/17/18 Yes Shah, Vipul, MD  ELIQUIS 5 MG TABS tablet TAKE 1 TABLET (5 MG TOTAL) BY MOUTH 2 (TWO) TIMES DAILY. 01/28/17  Yes Gollan, Kathlene November, MD  escitalopram (LEXAPRO) 10 MG tablet Take 10 mg by mouth daily.  05/01/17  Yes [provider]  levothyroxine (SYNTHROID, LEVOTHROID) 112 MCG tablet Take 112 mcg by mouth every morning. 01/07/17  Yes [provider]  magnesium oxide (MAG-OX) 400 MG tablet Take 400 mg by mouth daily.  05/01/17 05/01/18 Yes [provider]  metFORMIN (GLUCOPHAGE) 500 MG tablet Take 1,000 mg by mouth 2 (two) times daily. 07/11/15  Yes [provider]  ramipril (ALTACE) 10 MG capsule TAKE 1 CAPSULE (10 MG TOTAL) BY MOUTH DAILY. 12/09/16  Yes Gollan, Kathlene November, MD  zolpidem (AMBIEN) 5 MG tablet Take 2.5-5 mg by mouth at bedtime as needed for sleep. 04/11/17   [provider]      VITAL SIGNS:  Blood pressure (!) 166/79, pulse 85, temperature 98.2 F (36.8 C), temperature source Oral, resp. rate (!) 23, height 5\' 3"  (1.6 m), weight 69.9 kg (154 lb), SpO2 94 %.  PHYSICAL EXAMINATION:  GENERAL:  83 y.o.-year-old patient lying in the bed with no acute distress.  EYES: Pupils equal, round, reactive to light and accommodation. No scleral icterus. Extraocular muscles intact.  HEENT: Head atraumatic, normocephalic. Oropharynx and nasopharynx clear. Left side of the forehead with small swelling from the fall NECK:  Supple, no jugular venous distention. No thyroid enlargement, no tenderness.  LUNGS: Normal breath sounds bilaterally, no wheezing, rales,rhonchi or crepitation. No use of accessory muscles of respiration.  CARDIOVASCULAR: S1, S2 normal. No murmurs, rubs, or gallops.  ABDOMEN: Soft, nontender, nondistended. Bowel sounds present. No  organomegaly or mass.  EXTREMITIES: No pedal edema, cyanosis, or clubbing.  NEUROLOGIC: Cranial nerves II through XII are intact. Muscle strength 5/5 in all extremities. Sensation intact. Gait not checked.  PSYCHIATRIC: The patient is alert and oriented x 3.  SKIN: No obvious rash, lesion, or ulcer.   LABORATORY PANEL:   CBC  Recent Labs Lab 05/14/17 1048  WBC 12.7*  HGB 11.1*  HCT 33.2*  PLT 351   ------------------------------------------------------------------------------------------------------------------  Chemistries   Recent Labs Lab 05/14/17 1048  NA 138  K 3.7  CL 104  CO2 26  GLUCOSE 124*  BUN 16  CREATININE 0.60  CALCIUM 9.5  AST 19  ALT 11*  ALKPHOS 74  BILITOT 1.1   ------------------------------------------------------------------------------------------------------------------  Cardiac Enzymes No results for input(s): TROPONINI in the last 168 hours. ------------------------------------------------------------------------------------------------------------------  RADIOLOGY:  Ct Head Wo Contrast  Result Date: 05/14/2017 CLINICAL DATA:  83 year old female with history of trauma after falling off of a toilet. Dizziness and head pain. EXAM: CT HEAD WITHOUT CONTRAST CT CERVICAL SPINE WITHOUT CONTRAST TECHNIQUE: Multidetector CT imaging of the  head and cervical spine was performed following the standard protocol without intravenous contrast. Multiplanar CT image reconstructions of the cervical spine were also generated. COMPARISON:  Head CT 11/14/2012. FINDINGS: CT HEAD FINDINGS Brain: In the occipital region there is a relatively well-defined 1.9 x 1.2 x 1.5 cm low-attenuation lesion (axial image 16 of series 2 and coronal image 51 of series 4). Slightly caudal to this also in the occipital region there is a smaller well-defined low-attenuation lesion measuring 1.0 x 1.3 x 0.9 cm (axial image 14 of series 2 and coronal image 46 of series 4). In the adjacent  brain parenchyma throughout the right parietal, occipital and posterior temple regions there is extensive low attenuation, loss of gray-white junction, and effacement of the overlying sulci, compatible with underlying edema. No significant midline shift or evidence of uncal herniation noted at this time. Mild cerebral edema. Patchy and confluent areas of decreased attenuation are noted throughout the deep and periventricular white matter of the cerebral hemispheres bilaterally, compatible with chronic microvascular ischemic disease. No hydrocephalus. Vascular: No hyperdense vessel or unexpected calcification. Skull: Normal. Negative for fracture or focal lesion. Sinuses/Orbits: No acute finding. Other: None. CT CERVICAL SPINE FINDINGS Alignment: Exaggeration of normal cervical lordosis. Otherwise, normal. Skull base and vertebrae: No acute fracture. No primary bone lesion or focal pathologic process. Soft tissues and spinal canal: No prevertebral fluid or swelling. No visible canal hematoma. Disc levels: Multilevel degenerative disc disease, most severe at C5-C6. Moderate multilevel facet arthropathy. Upper chest: Visualized portions are unremarkable. Other: None. IMPRESSION: 1. There are 2 low-attenuation lesions in the right occipital lobe with extensive surrounding cerebral edema in adjacent portions of the right occipital, parietal and posterior temporal regions. Findings are highly concerning for metastatic disease to the brain, but could alternatively reflect the presence of small abscesses. Further evaluation with brain MRI with and without IV gadolinium is strongly recommended at this time. 2. No evidence of significant acute traumatic injury to the skull, brain or cervical spine. 3. Mild cerebral atrophy with extensive chronic microvascular ischemic changes in the cerebral white matter. 4. Mild multilevel degenerative disc disease and cervical spondylosis, as above. These results were called by telephone at  the time of interpretation on 05/14/2017 at 10:08 am to Dr. Rudene Re, who verbally acknowledged these results. Electronically Signed   By: Vinnie Langton M.D.   On: 05/14/2017 10:10   Ct Cervical Spine Wo Contrast  Result Date: 05/14/2017 CLINICAL DATA:  83 year old female with history of trauma after falling off of a toilet. Dizziness and head pain. EXAM: CT HEAD WITHOUT CONTRAST CT CERVICAL SPINE WITHOUT CONTRAST TECHNIQUE: Multidetector CT imaging of the head and cervical spine was performed following the standard protocol without intravenous contrast. Multiplanar CT image reconstructions of the cervical spine were also generated. COMPARISON:  Head CT 11/14/2012. FINDINGS: CT HEAD FINDINGS Brain: In the occipital region there is a relatively well-defined 1.9 x 1.2 x 1.5 cm low-attenuation lesion (axial image 16 of series 2 and coronal image 51 of series 4). Slightly caudal to this also in the occipital region there is a smaller well-defined low-attenuation lesion measuring 1.0 x 1.3 x 0.9 cm (axial image 14 of series 2 and coronal image 46 of series 4). In the adjacent brain parenchyma throughout the right parietal, occipital and posterior temple regions there is extensive low attenuation, loss of gray-white junction, and effacement of the overlying sulci, compatible with underlying edema. No significant midline shift or evidence of uncal herniation noted at this time.  Mild cerebral edema. Patchy and confluent areas of decreased attenuation are noted throughout the deep and periventricular white matter of the cerebral hemispheres bilaterally, compatible with chronic microvascular ischemic disease. No hydrocephalus. Vascular: No hyperdense vessel or unexpected calcification. Skull: Normal. Negative for fracture or focal lesion. Sinuses/Orbits: No acute finding. Other: None. CT CERVICAL SPINE FINDINGS Alignment: Exaggeration of normal cervical lordosis. Otherwise, normal. Skull base and vertebrae: No  acute fracture. No primary bone lesion or focal pathologic process. Soft tissues and spinal canal: No prevertebral fluid or swelling. No visible canal hematoma. Disc levels: Multilevel degenerative disc disease, most severe at C5-C6. Moderate multilevel facet arthropathy. Upper chest: Visualized portions are unremarkable. Other: None. IMPRESSION: 1. There are 2 low-attenuation lesions in the right occipital lobe with extensive surrounding cerebral edema in adjacent portions of the right occipital, parietal and posterior temporal regions. Findings are highly concerning for metastatic disease to the brain, but could alternatively reflect the presence of small abscesses. Further evaluation with brain MRI with and without IV gadolinium is strongly recommended at this time. 2. No evidence of significant acute traumatic injury to the skull, brain or cervical spine. 3. Mild cerebral atrophy with extensive chronic microvascular ischemic changes in the cerebral white matter. 4. Mild multilevel degenerative disc disease and cervical spondylosis, as above. These results were called by telephone at the time of interpretation on 05/14/2017 at 10:08 am to Dr. Rudene Re, who verbally acknowledged these results. Electronically Signed   By: Vinnie Langton M.D.   On: 05/14/2017 10:10    EKG:   Orders placed or performed during the hospital encounter of 05/14/17  . EKG 12-Lead  . EKG 12-Lead    IMPRESSION AND PLAN:   Rhonda Hurley  is a 83 y.o. female with a known history ofdiabetes mellitus, hypertension, history of breast cancer status post chemotherapy 20 years ago currently cancer free, chronic atrial fibrillation on a eliquis is brought into [facility] after she sustained a fall. CT head with no intracranial bleed but has revealed he 2 low attenuation lesions with surrounding cerebral edema but no midline shift  #Brain metastases with cerebral edema Admit to  MedSurg unit Neuro checks Decadron 10 g IV was given in the ED , will continue Decadron 4 mg IV every 6 hours MRI of the brain with and without contrast is ordered Will also get CT chest, abdomen and pelvis with contrast-to check primary Patient has history of breast cancer 20 years ago status post chemotherapy and cancer free and asked her mammogram was also normal Oncology consult placed and discussed with Dr. Seward Meth and radiation oncology Dr. Donella Stade   #chronic atrial fibrillation Rate controlled continue home medication Cardizem CD and eliquis  #Hypothyroidism continue Synthroid  #Essential hypertension continue home medication altace and Cardizem  #diabetes mellitus non-insulin requiring Hold metformin and provide sliding scale insulin. Check hemoglobin A1c  GI prophylaxis with Protonix DVT prophylaxis with Lovenox subcutaneous  All the records are reviewed and case discussed with ED provider. Management plans discussed with the patient, family and they are in agreement.  CODE STATUS: DNR /husband and daughter healthcare power of attorney  TOTAL TIME TAKING CARE OF THIS PATIENT: 45 minutes.   Note: This dictation was prepared with Dragon dictation along with smaller phrase technology. Any transcriptional errors that result from this process are unintentional.  Nicholes Mango M.D on 05/14/2017 at 11:39 AM  Between 7am to 6pm - Pager - [phone]  After 6pm go to [email or web address] -  password [email or web address] [facility]  [facility]  Office  [phone]  CC: Primary care physician; Juluis Pitch, MD

## 2017-05-14 NOTE — ED Notes (Signed)
Hooked patient up to monitor. 

## 2017-05-14 NOTE — Consult Note (Signed)
NEW PATIENT EVALUATION  Name: Rhonda Hurley  MRN: [id number]  Date:   05/14/2017     DOB: [birth year]   This 83 y.o. female patient presents to the clinic for initial evaluation of probable GBM.  REFERRING PHYSICIAN: No ref. provider found  CHIEF COMPLAINT:  Chief Complaint  Patient presents with  . Fall    DIAGNOSIS: The encounter diagnosis was Metastatic cancer to brain [facility]).   PREVIOUS INVESTIGATIONS:  MRI scan and CT scans of head reviewed Clinical notes reviewed  HPI: Patient is a 83 year old female otherwise in good general condition who has a history of gait instability with tendency towards leaning towards the left side over the past several weeks. She was admitted to the hospital has a history of A. fib is on anticoagulation therapy. CT scan demonstrated a large lesion in the right cerebrum MRI of the brain conformed a 4-5 cm malignancy in the right posterior inferior hemisphere likely representing a glioblastoma. Patient is scheduled to see neurosurgery. She has noted some numbness in her toes over the past year. Otherwise crude visual fields within normal range. I been asked to evaluate her and render opinion regarding possible palliative radiation.  PLANNED TREATMENT REGIMEN: Possible palliative radiation  PAST MEDICAL HISTORY:  has a past medical history of Acute cerebrovascular accident ([place]); Cancer ([place]); Chronic diastolic CHF (congestive heart failure) ([place]); Derangement of posterior horn of medial meniscus; Essential hypertension (04/08/2014); Junctional bradycardia; LBBB (left bundle branch block); Mitral valve disorders(424.0); Other and unspecified hyperlipidemia; Paroxysmal atrial fibrillation ([place]); TIA (transient ischemic attack) (2014); Type II or unspecified type diabetes mellitus without mention of complication, not stated as uncontrolled; and Unspecified hypothyroidism.    PAST SURGICAL HISTORY:  Past Surgical History:  Procedure Laterality Date  .  BREAST LUMPECTOMY  12/1996   left   . CATARACT EXTRACTION  2000   left  . CATARACT EXTRACTION  11/2010   right  . ELECTROPHYSIOLOGIC STUDY N/A 06/13/2015   Procedure: Cardioversion;  Surgeon: Minna Merritts, MD;  Location: [facility] ORS;  Service: Cardiovascular;  Laterality: N/A;  . radioactive ablation   1999   thyroid glan  . SPLENECTOMY      FAMILY HISTORY: family history is not on file.  SOCIAL HISTORY:  reports that she has never smoked. She has never used smokeless tobacco. She reports that she does not drink alcohol or use drugs.  ALLERGIES: Patient has no known allergies.  MEDICATIONS:  Current Facility-Administered Medications  Medication Dose Route Frequency Provider Last Rate Last Dose  . acetaminophen (TYLENOL) tablet 650 mg  650 mg Oral Q6H PRN Gouru, Aruna, MD       Or  . acetaminophen (TYLENOL) suppository 650 mg  650 mg Rectal Q6H PRN Gouru, Aruna, MD      . apixaban (ELIQUIS) tablet 5 mg  5 mg Oral BID Gouru, Aruna, MD   5 mg at 05/14/17 1312  . atorvastatin (LIPITOR) tablet 10 mg  10 mg Oral Daily Gouru, Aruna, MD   10 mg at 05/14/17 1311  . dexamethasone (DECADRON) injection 4 mg  4 mg Intravenous Q6H Gouru, Aruna, MD   4 mg at 05/14/17 1311  . diltiazem (CARDIZEM CD) 24 hr capsule 120 mg  120 mg Oral Daily Gouru, Aruna, MD   120 mg at 05/14/17 1312  . escitalopram (LEXAPRO) tablet 10 mg  10 mg Oral Daily Gouru, Aruna, MD   10 mg at 05/14/17 1312  . insulin aspart (novoLOG) injection 0-5 Units  0-5 Units Subcutaneous QHS  Gouru, Aruna, MD      . insulin aspart (novoLOG) injection 0-9 Units  0-9 Units Subcutaneous TID WC Gouru, Aruna, MD   1 Units at 05/14/17 1311  . [START ON 05/15/2017] levothyroxine (SYNTHROID, LEVOTHROID) tablet 112 mcg  112 mcg Oral Q breakfast Gouru, Aruna, MD      . magnesium oxide (MAG-OX) tablet 400 mg  400 mg Oral Daily Gouru, Aruna, MD   400 mg at 05/14/17 1312  . MEDLINE mouth rinse  15 mL Mouth Rinse BID Gouru, Aruna, MD   15 mL at 05/14/17  1313  . ondansetron (ZOFRAN) tablet 4 mg  4 mg Oral Q6H PRN Gouru, Aruna, MD       Or  . ondansetron (ZOFRAN) injection 4 mg  4 mg Intravenous Q6H PRN Gouru, Aruna, MD      . ramipril (ALTACE) capsule 10 mg  10 mg Oral Daily Gouru, Aruna, MD   10 mg at 05/14/17 1312  . zolpidem (AMBIEN) tablet 2.5-5 mg  2.5-5 mg Oral QHS PRN Gouru, Aruna, MD        ECOG PERFORMANCE STATUS:  2 - Symptomatic, <50% confined to bed  REVIEW OF SYSTEMS: Except for the gait imbalance and numbness in her toes Patient denies any weight loss, fatigue, weakness, fever, chills or night sweats. Patient denies any loss of vision, blurred vision. Patient denies any ringing  of the ears or hearing loss. No irregular heartbeat. Patient denies heart murmur or history of fainting. Patient denies any chest pain or pain radiating to her upper extremities. Patient denies any shortness of breath, difficulty breathing at night, cough or hemoptysis. Patient denies any swelling in the lower legs. Patient denies any nausea vomiting, vomiting of blood, or coffee ground material in the vomitus. Patient denies any stomach pain. Patient states has had normal bowel movements no significant constipation or diarrhea. Patient denies any dysuria, hematuria or significant nocturia. Patient denies any problems walking, swelling in the joints or loss of balance. Patient denies any skin changes, loss of hair or loss of weight. Patient denies any excessive worrying or anxiety or significant depression. Patient denies any problems with insomnia. Patient denies excessive thirst, polyuria, polydipsia. Patient denies any swollen glands, patient denies easy bruising or easy bleeding. Patient denies any recent infections, allergies or URI. Patient "s visual fields have not changed significantly in recent time.    PHYSICAL EXAM: BP (!) 191/76   Pulse 81   Temp 97.8 F (36.6 C) (Oral)   Resp (!) 23   Ht 5\' 3"  (1.6 m)   Wt 154 lb (69.9 kg)   SpO2 100%   BMI  27.28 kg/m  Well-developed elderly female seen in her bed. Crude visual fields within normal range she does have some decreased sensation in her lower extremities as well as decreased proprioception. Otherwise motor and sensory levels in her upper extremities are equal and symmetric. Well-developed well-nourished patient in NAD. HEENT reveals PERLA, EOMI, discs not visualized.  Oral cavity is clear. No oral mucosal lesions are identified. Neck is clear without evidence of cervical or supraclavicular adenopathy. Lungs are clear to A&P. Cardiac examination is essentially unremarkable with regular rate and rhythm without murmur rub or thrill. Abdomen is benign with no organomegaly or masses noted. Motor sensory and DTR levels are equal and symmetric in the upper and lower extremities. Cranial nerves II through XII are grossly intact. Proprioception is intact. No peripheral adenopathy or edema is identified. No motor or sensory levels are noted. Crude  visual fields are within normal range.  LABORATORY DATA: No current pathology    RADIOLOGY RESULTS: MRI scan CT scans of head are reviewed and compatible with the above-stated findings   IMPRESSION: Probable right GBM in 83 year old female  PLAN: At this time patient will be evaluated by neurosurgery. I believe other scans were chest abdomen and pelvis are in order for complete workup. Do not believe with a prolonged interval since her breast cancer this is breast metastasis to her brain. I favor GBM. If they're planning on a biopsy I would plan accordingly with palliative radiation therapy. Otherwise even if no biopsy is performed would favor a palliative course of radiation therapy 4000 cGy in 15 fractions which is the standard palliative dose with GBM. Risks and benefits of treatment including hair loss fatigue alteration of blood counts skin reaction all were discussed in detail with the patient and her husband. Depending on neurosurgical's evaluation and  recommendations will make further reformations at that time.  I would like to take this opportunity to thank you for allowing me to participate in the care of your patient.Armstead Peaks., MD

## 2017-05-14 NOTE — ED Notes (Signed)
Pt does not have pacemaker per daughter.

## 2017-05-14 NOTE — ED Triage Notes (Signed)
Pt via ems from home ([place]) after falling off the toilet. Pt denies loc, dizziness, pain. States she was reaching for the toilet paper, and fell. Pt has pacemaker, A&O x 4. NAD noted.

## 2017-05-14 NOTE — Progress Notes (Signed)
Oral Contrast Stopped at this time; patient discontinued drinking per Dr Rogue Bussing- CT abd/pelv order discontinued by MD.

## 2017-05-14 NOTE — Consult Note (Signed)
[place] NOTE  Patient Care Team: Juluis Pitch, MD as PCP - General (Family Medicine) [name], MD as Consulting Physician (Cardiology)  CHIEF COMPLAINTS/PURPOSE OF CONSULTATION:  Status post fall.   HISTORY OF PRESENTING ILLNESS:  Rhonda Hurley 83 y.o.  female with remote history of breast cancer; and also history of A. fib on anticoagulation- had a recent fall for which was brought into the hospital for further evaluation. A CT scan showed no brain bleed; that showed Lesions concerning for malignancy with cerebral edema.  Patient states that she noted to have gait instability in the last few weeks. She also noted to have veering towards the left while shopping with her cart. In general she has been fatigued more so in the last few weeks. Otherwise denies any headaches. Has chronic vision problems not any worse.  Interestingly patient was recently evaluated in the emergency room "dehydration"/abdominal pain- CT of the abdomen and pelvis was negative for any malignancy or acute process.    ROS: A complete 10 point review of system is done which is negative except mentioned above in history of present illness  MEDICAL HISTORY:  Past Medical History:  Diagnosis Date  . Acute cerebrovascular accident [facility])    a. 10/2012: MRI concern for posible small acute infarct along the left parietal cortex  . Cancer ([place])    breast  . Chronic diastolic CHF (congestive heart failure) ([facility])    a. 03/2007 Echo: nl EF, mod MR,mild TR, Diast dysfxn; b. echo 10/2012: EF 50-55%, possible HK of anterior & anteroseptal wall, mild to mod LVH, nl RVSP.  Marland Kitchen Derangement of posterior horn of medial meniscus   . Essential hypertension 04/08/2014  . Junctional bradycardia    a. admitted 04/2017 w/ junctional bradycardia; b. rates improved w/ holding metoprolol, flecainide, & timolol; c. discharged on Cardizem CD 120 mg daily & Eliquis  . LBBB (left bundle branch block)   .  Mitral valve disorders(424.0)    a. echo 03/2007: nl LVEF, mod MR, mild TR, DD; b. echo 10/2012: EF 50-55%, possible HK of anterior & anteroseptal wall, mild to mod LVH, nl RVSP; c. TTE 8/18: EF 60-65%, no RWMA, mild MR, mod dilated LA, RV sys fxn nl, PASP 36    . Other and unspecified hyperlipidemia   . Paroxysmal atrial fibrillation (HCC)    a. CHA2DS2VASc = 8-->eliquis;  b. Managed w/ flecainide and BB therapy.  Marland Kitchen TIA (transient ischemic attack) 2014  . Type II or unspecified type diabetes mellitus without mention of complication, not stated as uncontrolled   . Unspecified hypothyroidism     SURGICAL HISTORY: Past Surgical History:  Procedure Laterality Date  . BREAST LUMPECTOMY  12/1996   left   . CATARACT EXTRACTION  2000   left  . CATARACT EXTRACTION  11/2010   right  . ELECTROPHYSIOLOGIC STUDY N/A 06/13/2015   Procedure: Cardioversion;  Surgeon: Minna Merritts, MD;  Location: [facility] ORS;  Service: Cardiovascular;  Laterality: N/A;  . radioactive ablation   1999   thyroid glan  . SPLENECTOMY      SOCIAL HISTORY: Social History   Social History  . Marital status: Married    Spouse name: N/A  . Number of children: N/A  . Years of education: N/A   Occupational History  . Not on file.   Social History Main Topics  . Smoking status: Never Smoker  . Smokeless tobacco: Never Used  . Alcohol use No  . Drug use:  No  . Sexual activity: Not on file   Other Topics Concern  . Not on file   Social History Narrative  . No narrative on file    FAMILY HISTORY: Family History  Problem Relation Age of Onset  . Bladder Cancer Neg Hx   . Kidney cancer Neg Hx     ALLERGIES:  has No Known Allergies.  MEDICATIONS:  Current Facility-Administered Medications  Medication Dose Route Frequency Provider Last Rate Last Dose  . acetaminophen (TYLENOL) tablet 650 mg  650 mg Oral Q6H PRN Gouru, Aruna, MD       Or  . acetaminophen (TYLENOL) suppository 650 mg  650 mg Rectal Q6H PRN  Gouru, Aruna, MD      . apixaban (ELIQUIS) tablet 5 mg  5 mg Oral BID Gouru, Aruna, MD   5 mg at 05/14/17 1312  . atorvastatin (LIPITOR) tablet 10 mg  10 mg Oral Daily Gouru, Aruna, MD   10 mg at 05/14/17 1311  . dexamethasone (DECADRON) injection 4 mg  4 mg Intravenous Q6H Gouru, Aruna, MD   4 mg at 05/14/17 1311  . diltiazem (CARDIZEM CD) 24 hr capsule 120 mg  120 mg Oral Daily Gouru, Aruna, MD   120 mg at 05/14/17 1312  . escitalopram (LEXAPRO) tablet 10 mg  10 mg Oral Daily Gouru, Aruna, MD   10 mg at 05/14/17 1312  . insulin aspart (novoLOG) injection 0-5 Units  0-5 Units Subcutaneous QHS Gouru, Aruna, MD      . insulin aspart (novoLOG) injection 0-9 Units  0-9 Units Subcutaneous TID WC Gouru, Aruna, MD   1 Units at 05/14/17 1311  . [START ON 05/15/2017] levothyroxine (SYNTHROID, LEVOTHROID) tablet 112 mcg  112 mcg Oral Q breakfast Gouru, Aruna, MD      . magnesium oxide (MAG-OX) tablet 400 mg  400 mg Oral Daily Gouru, Aruna, MD   400 mg at 05/14/17 1312  . MEDLINE mouth rinse  15 mL Mouth Rinse BID Gouru, Aruna, MD   15 mL at 05/14/17 1313  . ondansetron (ZOFRAN) tablet 4 mg  4 mg Oral Q6H PRN Gouru, Aruna, MD       Or  . ondansetron (ZOFRAN) injection 4 mg  4 mg Intravenous Q6H PRN Gouru, Aruna, MD      . ramipril (ALTACE) capsule 10 mg  10 mg Oral Daily Gouru, Aruna, MD   10 mg at 05/14/17 1312  . zolpidem (AMBIEN) tablet 2.5-5 mg  2.5-5 mg Oral QHS PRN Nicholes Mango, MD          .  PHYSICAL EXAMINATION:  Vitals:   05/14/17 0904 05/14/17 1208  BP: (!) 166/79 (!) 191/76  Pulse: 85 81  Resp: (!) 23   Temp: 98.2 F (36.8 C) 97.8 F (36.6 C)  SpO2: 94% 100%   Filed Weights   05/14/17 0905  Weight: 154 lb (69.9 kg)    GENERAL: Well-nourished well-developed; Alert, no distress and comfortable.   Accompanied by family-husband/daughter EYES: no pallor or icterus OROPHARYNX: no thrush or ulceration. NECK: supple, no masses felt LYMPH:  no palpable lymphadenopathy in the  cervical, axillary or inguinal regions LUNGS: decreased breath sounds to auscultation at bases and  No wheeze or crackles HEART/CVS: regular rate & rhythm and no murmurs; No lower extremity edema ABDOMEN: abdomen soft, non-tender and normal bowel sounds Musculoskeletal:no cyanosis of digits and no clubbing  PSYCH: alert & oriented x 3 with fluent speech NEURO: no focal motor/sensory deficits SKIN:  no rashes or  significant lesions  LABORATORY DATA:  I have reviewed the data as listed Lab Results  Component Value Date   WBC 12.7 (H) 05/14/2017   HGB 11.1 (L) 05/14/2017   HCT 33.2 (L) 05/14/2017   MCV 87.5 05/14/2017   PLT 351 05/14/2017    Recent Labs  04/05/17 0058  04/17/17 0517 05/14/17 1048 05/14/17 1243  NA 140  < > 140 138 137  K 3.5  < > 3.6 3.7 3.7  CL 108  < > 108 104 103  CO2 23  < > 24 26 25   GLUCOSE 111*  < > 147* 124* 146*  BUN 17  < > 13 16 15   CREATININE 0.68  < > 0.66 0.60 0.62  CALCIUM 9.1  < > 9.0 9.5 9.6  GFRNONAA >60  < > >60 >60 >60  GFRAA >60  < > >60 >60 >60  PROT 6.6  --   --  6.3*  --   ALBUMIN 3.5  --   --  3.3*  --   AST 22  --   --  19  --   ALT 12*  --   --  11*  --   ALKPHOS 80  --   --  74  --   BILITOT 0.6  --   --  1.1  --   BILIDIR <0.1*  --   --   --   --   IBILI NOT CALCULATED  --   --   --   --   < > = values in this interval not displayed.  RADIOGRAPHIC STUDIES: I have personally reviewed the radiological images as listed and agreed with the findings in the report. Dg Chest 2 View  Result Date: 04/15/2017 CLINICAL DATA:  Weakness and chest tightness EXAM: CHEST  2 VIEW COMPARISON:  04/04/2017 FINDINGS: Cardiac shadow is again enlarged. Aortic calcifications are seen. The lungs are well aerated bilaterally. No focal infiltrate or sizable effusion is seen. Degenerative changes of the thoracic spine are noted. IMPRESSION: No acute abnormality noted. Electronically Signed   By: Inez Catalina M.D.   On: 04/15/2017 14:09   Ct Head Wo  Contrast  Result Date: 05/14/2017 CLINICAL DATA:  83 year old female with history of trauma after falling off of a toilet. Dizziness and head pain. EXAM: CT HEAD WITHOUT CONTRAST CT CERVICAL SPINE WITHOUT CONTRAST TECHNIQUE: Multidetector CT imaging of the head and cervical spine was performed following the standard protocol without intravenous contrast. Multiplanar CT image reconstructions of the cervical spine were also generated. COMPARISON:  Head CT 11/14/2012. FINDINGS: CT HEAD FINDINGS Brain: In the occipital region there is a relatively well-defined 1.9 x 1.2 x 1.5 cm low-attenuation lesion (axial image 16 of series 2 and coronal image 51 of series 4). Slightly caudal to this also in the occipital region there is a smaller well-defined low-attenuation lesion measuring 1.0 x 1.3 x 0.9 cm (axial image 14 of series 2 and coronal image 46 of series 4). In the adjacent brain parenchyma throughout the right parietal, occipital and posterior temple regions there is extensive low attenuation, loss of gray-white junction, and effacement of the overlying sulci, compatible with underlying edema. No significant midline shift or evidence of uncal herniation noted at this time. Mild cerebral edema. Patchy and confluent areas of decreased attenuation are noted throughout the deep and periventricular white matter of the cerebral hemispheres bilaterally, compatible with chronic microvascular ischemic disease. No hydrocephalus. Vascular: No hyperdense vessel or unexpected calcification. Skull: Normal. Negative for  fracture or focal lesion. Sinuses/Orbits: No acute finding. Other: None. CT CERVICAL SPINE FINDINGS Alignment: Exaggeration of normal cervical lordosis. Otherwise, normal. Skull base and vertebrae: No acute fracture. No primary bone lesion or focal pathologic process. Soft tissues and spinal canal: No prevertebral fluid or swelling. No visible canal hematoma. Disc levels: Multilevel degenerative disc disease, most  severe at C5-C6. Moderate multilevel facet arthropathy. Upper chest: Visualized portions are unremarkable. Other: None. IMPRESSION: 1. There are 2 low-attenuation lesions in the right occipital lobe with extensive surrounding cerebral edema in adjacent portions of the right occipital, parietal and posterior temporal regions. Findings are highly concerning for metastatic disease to the brain, but could alternatively reflect the presence of small abscesses. Further evaluation with brain MRI with and without IV gadolinium is strongly recommended at this time. 2. No evidence of significant acute traumatic injury to the skull, brain or cervical spine. 3. Mild cerebral atrophy with extensive chronic microvascular ischemic changes in the cerebral white matter. 4. Mild multilevel degenerative disc disease and cervical spondylosis, as above. These results were called by telephone at the time of interpretation on 05/14/2017 at 10:08 am to Dr. Rudene Re, who verbally acknowledged these results. Electronically Signed   By: Vinnie Langton M.D.   On: 05/14/2017 10:10   Ct Chest W Contrast  Result Date: 05/14/2017 CLINICAL DATA:  Possible metastatic breast cancer. EXAM: CT CHEST WITH CONTRAST TECHNIQUE: Multidetector CT imaging of the chest was performed during intravenous contrast administration. CONTRAST:  27mL ISOVUE-300 IOPAMIDOL (ISOVUE-300) INJECTION 61% COMPARISON:  Radiographs of April 15, 2017. FINDINGS: Cardiovascular: Atherosclerosis of thoracic aorta is noted without aneurysm or dissection. Coronary artery calcifications are noted. Normal cardiac size. No pericardial effusion is noted. Mediastinum/Nodes: No enlarged mediastinal, hilar, or axillary lymph nodes. Thyroid gland, trachea, and esophagus demonstrate no significant findings. Lungs/Pleura: Lungs are clear. No pleural effusion or pneumothorax. Upper Abdomen: No acute abnormality. Musculoskeletal: No chest wall abnormality. No acute or significant  osseous findings. IMPRESSION: Coronary artery calcifications are noted suggesting coronary artery disease. No acute abnormality seen in the chest. Aortic Atherosclerosis (ICD10-I70.0). Electronically Signed   By: Marijo Conception, M.D.   On: 05/14/2017 14:30   Ct Cervical Spine Wo Contrast  Result Date: 05/14/2017 CLINICAL DATA:  83 year old female with history of trauma after falling off of a toilet. Dizziness and head pain. EXAM: CT HEAD WITHOUT CONTRAST CT CERVICAL SPINE WITHOUT CONTRAST TECHNIQUE: Multidetector CT imaging of the head and cervical spine was performed following the standard protocol without intravenous contrast. Multiplanar CT image reconstructions of the cervical spine were also generated. COMPARISON:  Head CT 11/14/2012. FINDINGS: CT HEAD FINDINGS Brain: In the occipital region there is a relatively well-defined 1.9 x 1.2 x 1.5 cm low-attenuation lesion (axial image 16 of series 2 and coronal image 51 of series 4). Slightly caudal to this also in the occipital region there is a smaller well-defined low-attenuation lesion measuring 1.0 x 1.3 x 0.9 cm (axial image 14 of series 2 and coronal image 46 of series 4). In the adjacent brain parenchyma throughout the right parietal, occipital and posterior temple regions there is extensive low attenuation, loss of gray-white junction, and effacement of the overlying sulci, compatible with underlying edema. No significant midline shift or evidence of uncal herniation noted at this time. Mild cerebral edema. Patchy and confluent areas of decreased attenuation are noted throughout the deep and periventricular white matter of the cerebral hemispheres bilaterally, compatible with chronic microvascular ischemic disease. No hydrocephalus. Vascular: No hyperdense vessel  or unexpected calcification. Skull: Normal. Negative for fracture or focal lesion. Sinuses/Orbits: No acute finding. Other: None. CT CERVICAL SPINE FINDINGS Alignment: Exaggeration of normal  cervical lordosis. Otherwise, normal. Skull base and vertebrae: No acute fracture. No primary bone lesion or focal pathologic process. Soft tissues and spinal canal: No prevertebral fluid or swelling. No visible canal hematoma. Disc levels: Multilevel degenerative disc disease, most severe at C5-C6. Moderate multilevel facet arthropathy. Upper chest: Visualized portions are unremarkable. Other: None. IMPRESSION: 1. There are 2 low-attenuation lesions in the right occipital lobe with extensive surrounding cerebral edema in adjacent portions of the right occipital, parietal and posterior temporal regions. Findings are highly concerning for metastatic disease to the brain, but could alternatively reflect the presence of small abscesses. Further evaluation with brain MRI with and without IV gadolinium is strongly recommended at this time. 2. No evidence of significant acute traumatic injury to the skull, brain or cervical spine. 3. Mild cerebral atrophy with extensive chronic microvascular ischemic changes in the cerebral white matter. 4. Mild multilevel degenerative disc disease and cervical spondylosis, as above. These results were called by telephone at the time of interpretation on 05/14/2017 at 10:08 am to Dr. Rudene Re, who verbally acknowledged these results. Electronically Signed   By: Vinnie Langton M.D.   On: 05/14/2017 10:10   Mr Brain W And Wo Contrast  Result Date: 05/14/2017 CLINICAL DATA:  [place] in the bathroom.  Abnormal head CT. EXAM: MRI HEAD WITHOUT AND WITH CONTRAST TECHNIQUE: Multiplanar, multiecho pulse sequences of the brain and surrounding structures were obtained without and with intravenous contrast. CONTRAST:  54mL MULTIHANCE GADOBENATE DIMEGLUMINE 529 MG/ML IV SOLN COMPARISON:  CT same day.  MRI 11/14/2012. FINDINGS: Brain: There is a 4-5 cm in diameter mass lesion in the posterior inferior cerebral hemisphere on the right at the junction of the posterior temporal lobe, occipital  lobe and parietal lobe with internal necrosis and irregular margins consistent with primary brain malignancy/glioblastoma multiforme. The lesion has a broad surface along the region of the atrium of the right lateral ventricle and there is presumably ependymal or subependymal extension. There is regional mass effect secondary to extensive vasogenic edema. Some thickening and edema in the splenium of the corpus callosum could represent edema or evidence of crossing tumor. No enhancement in that region. No other satellite foci of enhancement. Asymmetric T2 signal in the right thalamus could be edema or tumor infiltration. Elsewhere, the brain shows extensive chronic small-vessel ischemic changes throughout the hemispheric white matter. No hydrocephalus. No ventricular trapping. No extra-axial fluid collection. Vascular: Major vessels at the base of the brain show flow. Skull and upper cervical spine: Negative Sinuses/Orbits: Clear/normal Other: None significant IMPRESSION: 4-5 cm malignancy in the posterior inferior hemisphere on the right at the junction of the posterior temporal lobe, occipital lobe and parietal lobe with extensive areas of central necrosis. Ependymal/subependymal contact of the atrium of the right lateral ventricle. Extensive regional vasogenic edema. Abnormal T2 signal in the right thalamus and splenium of the corpus callosum could represent edema or nonenhancing tumor. Diagnosis likely glioblastoma multiforme. Background pattern of extensive chronic small-vessel ischemic changes throughout the brain. Electronically Signed   By: Nelson Chimes M.D.   On: 05/14/2017 12:12    ASSESSMENT & PLAN:   83 year old female patient currently admitted to the hospital for fall. CT scan/MRI suggestive of malignancy  # Fall/recent Instability- secondary to 4-5 cm malignancy- right temporal/parietal/occipital region with significant edema. MRI concerning for primary brain tumor-like GBM versus others.  Continue steroids for the edema. Recommend neurosurgical evaluation. Discussed with Dr. Donella Stade from radiation oncology.   # Remote history of breast cancer- recent CT of the abdomen pelvis negative; await CT of the chest. Unlikely to cause of her brain lesion.   #  History of chronic ITP - status post splenectomy.   # The above plan of care was discussed with the patient/daughter in detail; I reviewed the images myself and with the family.   Thank you Dr. Margaretmary Eddy for allowing me to participate in the care of your pleasant patient. Please do not hesitate to contact me with questions or concerns in the interim. Discuss with Dr.Gouru.  All questions were answered. The patient knows to call the clinic with any problems, questions or concerns.    Cammie Sickle, MD 05/14/2017 3:51 PM

## 2017-05-14 NOTE — ED Provider Notes (Addendum)
[facility] Emergency Department Provider Note  ____________________________________________  Time seen: Approximately 9:09 AM  I have reviewed the triage vital signs and the nursing notes.   HISTORY  Chief Complaint Fall   HPI Rhonda Hurley is a 83 y.o. female who presents via EMS from [facility] [place] for evaluation of head trauma status post mechanical fall. Patient is on Eliquis. she reports she was sitting in the toilet and reaching forward to grab the toilet paper when she lost her balance and fell. She hit her head onto the ground and has a small bruise to the left forehead. She denies LOC. She also reports an abrasion to her left knee. She denies headache, neck pain, back pain, chest pain, extremity pain. She denies any preceding symptoms such as dizziness, palpitations, headache or chest pain prior to her fall. She reports that her fall was mechanical in nature. She denies any medical complaints at this time.  Past Medical History:  Diagnosis Date  . Acute cerebrovascular accident [facility])    a. 10/2012: MRI concern for posible small acute infarct along the left parietal cortex  . Chronic diastolic CHF (congestive heart failure) ([place])    a. 03/2007 Echo: nl EF, mod MR,mild TR, Diast dysfxn; b. echo 10/2012: EF 50-55%, possible HK of anterior & anteroseptal wall, mild to mod LVH, nl RVSP.  Marland Kitchen Derangement of posterior horn of medial meniscus   . Essential hypertension 04/08/2014  . Junctional bradycardia    a. admitted 04/2017 w/ junctional bradycardia; b. rates improved w/ holding metoprolol, flecainide, & timolol; c. discharged on Cardizem CD 120 mg daily & Eliquis  . LBBB (left bundle branch block)   . Mitral valve disorders(424.0)    a. echo 03/2007: nl LVEF, mod MR, mild TR, DD; b. echo 10/2012: EF 50-55%, possible HK of anterior & anteroseptal wall, mild to mod LVH, nl RVSP; c. TTE 8/18: EF 60-65%, no RWMA, mild MR, mod dilated LA, RV sys fxn nl, PASP 36      . Other and unspecified hyperlipidemia   . Paroxysmal atrial fibrillation (HCC)    a. CHA2DS2VASc = 8-->eliquis;  b. Managed w/ flecainide and BB therapy.  Marland Kitchen TIA (transient ischemic attack) 2014  . Type II or unspecified type diabetes mellitus without mention of complication, not stated as uncontrolled   . Unspecified hypothyroidism     Patient Active Problem List   Diagnosis Date Noted  . AV junctional bradycardia 04/15/2017  . Generalized weakness 04/15/2017  . Acute renal insufficiency 04/15/2017  . Hyperglycemia 04/15/2017  . Lactic acidosis 04/15/2017  . Acute on chronic diastolic CHF (congestive heart failure) ([place]) 06/19/2015  . SOB (shortness of breath) 06/09/2015  . PAF (paroxysmal atrial fibrillation) ([place])   . Derangement of medial meniscus, posterior horn 12/12/2014  . Knee strain 12/02/2014  . Arthritis of knee, degenerative 12/02/2014  . Acquired hypothyroidism 10/14/2014  . Type 2 diabetes mellitus ([place]) 04/08/2014  . Personal history of malignant neoplasm of breast 04/08/2014  . BP (high blood pressure) 04/08/2014  . Paroxysmal atrial fibrillation ([place]) 04/08/2014  . Cough 08/14/2011  . Hyperlipidemia 02/05/2011  . HTN (hypertension) 02/05/2011  . Atrial fibrillation ([place]) 02/05/2011  . Diabetes mellitus ([place]) 02/05/2011    Past Surgical History:  Procedure Laterality Date  . BREAST LUMPECTOMY  12/1996   left   . CATARACT EXTRACTION  2000   left  . CATARACT EXTRACTION  11/2010   right  . ELECTROPHYSIOLOGIC STUDY N/A 06/13/2015   Procedure: Cardioversion;  Surgeon: Minna Merritts, MD;  Location: [facility] ORS;  Service: Cardiovascular;  Laterality: N/A;  . radioactive ablation   1999   thyroid glan  . SPLENECTOMY      Prior to Admission medications   Medication Sig Start Date End Date Taking? Authorizing Provider  atorvastatin (LIPITOR) 10 MG tablet Take 1 tablet (10 mg total) by mouth daily. 04/17/17 04/17/18  Max Sane, MD  Cholecalciferol (VITAMIN D3)  1000 UNITS CAPS Take 1,000 Units by mouth daily.     [provider]  diltiazem (CARDIZEM CD) 120 MG 24 hr capsule Take 1 capsule (120 mg total) by mouth daily. 04/17/17 04/17/18  Max Sane, MD  ELIQUIS 5 MG TABS tablet TAKE 1 TABLET (5 MG TOTAL) BY MOUTH 2 (TWO) TIMES DAILY. 01/28/17   Minna Merritts, MD  escitalopram (LEXAPRO) 10 MG tablet Take 10 mg by mouth daily.  05/01/17   [provider]  levothyroxine (SYNTHROID, LEVOTHROID) 112 MCG tablet Take 112 mcg by mouth every morning. 01/07/17   [provider]  magnesium oxide (MAG-OX) 400 MG tablet Take 400 mg by mouth daily.  05/01/17 05/01/18  [provider]  metFORMIN (GLUCOPHAGE) 500 MG tablet Take 1,000 mg by mouth 2 (two) times daily. 07/11/15   [provider]  ramipril (ALTACE) 10 MG capsule TAKE 1 CAPSULE (10 MG TOTAL) BY MOUTH DAILY. 12/09/16   Minna Merritts, MD  timolol (TIMOPTIC) 0.5 % ophthalmic solution 1 drop daily.     [provider]  vitamin B-12 (CYANOCOBALAMIN) 1000 MCG tablet Take 1,000 mcg by mouth daily.    [provider]  zolpidem (AMBIEN) 5 MG tablet Take 2.5-5 mg by mouth at bedtime as needed for sleep. 04/11/17   [provider]    Allergies Patient has no known allergies.  Family History  Problem Relation Age of Onset  . Bladder Cancer Neg Hx   . Kidney cancer Neg Hx     Social History Social History  Substance Use Topics  . Smoking status: Never Smoker  . Smokeless tobacco: Never Used  . Alcohol use No    Review of Systems Constitutional: Negative for fever. Eyes: Negative for visual changes. ENT: Negative for facial injury or neck injury Cardiovascular: Negative for chest injury. Respiratory: Negative for shortness of breath. Negative for chest wall injury. Gastrointestinal: Negative for abdominal pain or injury. Genitourinary: Negative for dysuria. Musculoskeletal: Negative for back injury, negative for arm or leg pain. Skin:  Negative for laceration. + L knee abrasion Neurological: + head injury.   ____________________________________________   PHYSICAL EXAM:  VITAL SIGNS: ED Triage Vitals  Enc Vitals Group     BP 05/14/17 0904 (!) 166/79     Pulse Rate 05/14/17 0904 85     Resp 05/14/17 0904 (!) 23     Temp 05/14/17 0904 98.2 F (36.8 C)     Temp Source 05/14/17 0904 Oral     SpO2 05/14/17 0904 94 %     Weight 05/14/17 0905 154 lb (69.9 kg)     Height 05/14/17 0905 5\' 3"  (1.6 m)     Head Circumference --      Peak Flow --      Pain Score 05/14/17 0904 0     Pain Loc --      Pain Edu? --      Excl. in [place]? --    Constitutional: Alert and oriented. No acute distress. Does not appear intoxicated. HEENT Head: Normocephalic and atraumatic.small abrasion to  the left forehead Face: No facial bony tenderness. Stable midface Ears: No hemotympanum bilaterally. No Battle sign Eyes: No eye injury. PERRL. No raccoon eyes Nose: Nontender. No epistaxis. No rhinorrhea Mouth/Throat: Mucous membranes are moist. No oropharyngeal blood. No dental injury. Airway patent without stridor. Normal voice. Neck:  No C-collar in place. No midline c-spine tenderness.  Cardiovascular: Normal rate, regular rhythm. Normal and symmetric distal pulses are present in all extremities. Pulmonary/Chest: Chest wall is stable and nontender to palpation/compression. Normal respiratory effort. Breath sounds are normal. No crepitus.  Abdominal: Soft, nontender, non distended. Musculoskeletal: Nontender with normal full range of motion in all extremities. No deformities. No thoracic or lumbar midline spinal tenderness. Pelvis is stable. Skin: Skin is warm, dry and intact. Abrasion to left knee Psychiatric: Speech and behavior are appropriate. Neurological: Normal speech and language. Moves all extremities to command. No gross focal neurologic deficits are appreciated.  Glascow Coma Score: 4 - Opens eyes on own 6 - Follows simple motor  commands 5 - Alert and oriented GCS: 15   ____________________________________________   LABS (all labs ordered are listed, but only abnormal results are displayed)  Labs Reviewed  CBC WITH DIFFERENTIAL/PLATELET  COMPREHENSIVE METABOLIC PANEL  PROTIME-INR  APTT   ____________________________________________  EKG  ED ECG REPORT I, Rudene Re, the attending physician, personally viewed and interpreted this ECG.  Normal sinus rhythm, rate of 87, first-degree AV block, left bundle branch block, left axis deviation, no ST elevations or depressions.no significant changes when compared to prior.  ____________________________________________  RADIOLOGY  CT head and cspine: 1. There are 2 low-attenuation lesions in the right occipital lobe with extensive surrounding cerebral edema in adjacent portions of the right occipital, parietal and posterior temporal regions. Findings are highly concerning for metastatic disease to the brain, but could alternatively reflect the presence of small abscesses. Further evaluation with brain MRI with and without IV gadolinium is strongly recommended at this time. 2. No evidence of significant acute traumatic injury to the skull, brain or cervical spine. 3. Mild cerebral atrophy with extensive chronic microvascular ischemic changes in the cerebral white matter. 4. Mild multilevel degenerative disc disease and cervical spondylosis, as above.  ____________________________________________   PROCEDURES  Procedure(s) performed: None Procedures Critical Care performed: yes  CRITICAL CARE Performed by: Rudene Re  ?  Total critical care time: 35 min  Critical care time was exclusive of separately billable procedures and treating other patients.  Critical care was necessary to treat or prevent imminent or life-threatening deterioration.  Critical care was time spent personally by me on the following activities: development of  treatment plan with patient and/or surrogate as well as nursing, discussions with consultants, evaluation of patient's response to treatment, examination of patient, obtaining history from patient or surrogate, ordering and performing treatments and interventions, ordering and review of laboratory studies, ordering and review of radiographic studies, pulse oximetry and re-evaluation of patient's condition.  ____________________________________________   INITIAL IMPRESSION / ASSESSMENT AND PLAN / ED COURSE   83 y.o. female on Eliquis who presents via EMS from [facility] [place] for evaluation of head trauma status post mechanical fall.patient is extremely well-appearing, neurologically intact, normal vital signs, no signs or symptoms of basilar skull fracture. She has a small abrasion on her forehead however since patient is on a blood thinner she'll undergo CT head and C-spine. She has a small abrasion on her left knee however has no tenderness and full painless range of motion therefore no imaging will be done at this  time. We'll monitor patient on telemetry. No indication for blood work.    _________________________ 10:26 AM on 05/14/2017 -----------------------------------------  CT head showing several brain masses with significant surrounding edema concerning for metastatic disease. Patient has had breast cancer over 20 years ago. No other known cancer. Patient remains with GCS 15. Updated about the status. Discussed with Dr. Rogue Bussing, oncologist on call who recommended MRI, decadron and admission for further evaluation.  Pertinent labs & imaging results that were available during my care of the patient were reviewed by me and considered in my medical decision making (see chart for details).    ____________________________________________   FINAL CLINICAL IMPRESSION(S) / ED DIAGNOSES  Final diagnoses:  Metastatic cancer to brain [facility])      NEW MEDICATIONS STARTED DURING THIS  VISIT:  New Prescriptions   No medications on file     Note:  This document was prepared using Dragon voice recognition software and may include unintentional dictation errors.    Alfred Levins, [place], MD 05/14/17 [place], [place], MD 05/14/17 1030

## 2017-05-14 NOTE — ED Notes (Signed)
Pt in mri 

## 2017-05-14 NOTE — Consult Note (Signed)
Referring Physician:  No referring provider defined for this encounter.  Primary Physician:  Juluis Pitch, MD  Chief Complaint:  Brain tumor  History of Present Illness: Rhonda Hurley is a 83 y.o. female who presents with the chief complaint of newly found brain tumor. Rhonda Hurley has been suffering from some gait difficulty and subtle cognitive changes recently. She also reports that she has been having trouble balancing her checkbook, which is not normal for her.  She lives independently with her husband.  She suffered a fall, which prompted CT scan of the brain.  This revealed an abnormality that now appears to be an enhancing R parietooccipital brain tumor.  She has been seen by radiation oncology.  She denies headaches, nausea, vomiting, or signs or symptoms of seizure.   The symptoms are causing a significant impact on the patient's life.   Review of Systems:  A 10 point review of systems is negative, except for the pertinent positives and negatives detailed in the HPI.  Past Medical History: Past Medical History:  Diagnosis Date  . Acute cerebrovascular accident [facility])    a. 10/2012: MRI concern for posible small acute infarct along the left parietal cortex  . Cancer ([place])    breast  . Chronic diastolic CHF (congestive heart failure) ([place])    a. 03/2007 Echo: nl EF, mod MR,mild TR, Diast dysfxn; b. echo 10/2012: EF 50-55%, possible HK of anterior & anteroseptal wall, mild to mod LVH, nl RVSP.  Marland Kitchen Derangement of posterior horn of medial meniscus   . Essential hypertension 04/08/2014  . Junctional bradycardia    a. admitted 04/2017 w/ junctional bradycardia; b. rates improved w/ holding metoprolol, flecainide, & timolol; c. discharged on Cardizem CD 120 mg daily & Eliquis  . LBBB (left bundle branch block)   . Mitral valve disorders(424.0)    a. echo 03/2007: nl LVEF, mod MR, mild TR, DD; b. echo 10/2012: EF 50-55%, possible HK of anterior & anteroseptal wall, mild to  mod LVH, nl RVSP; c. TTE 8/18: EF 60-65%, no RWMA, mild MR, mod dilated LA, RV sys fxn nl, PASP 36    . Other and unspecified hyperlipidemia   . Paroxysmal atrial fibrillation (HCC)    a. CHA2DS2VASc = 8-->eliquis;  b. Managed w/ flecainide and BB therapy.  Marland Kitchen TIA (transient ischemic attack) 2014  . Type II or unspecified type diabetes mellitus without mention of complication, not stated as uncontrolled   . Unspecified hypothyroidism     Past Surgical History: Past Surgical History:  Procedure Laterality Date  . BREAST LUMPECTOMY  12/1996   left   . CATARACT EXTRACTION  2000   left  . CATARACT EXTRACTION  11/2010   right  . ELECTROPHYSIOLOGIC STUDY N/A 06/13/2015   Procedure: Cardioversion;  Surgeon: Minna Merritts, MD;  Location: [facility] ORS;  Service: Cardiovascular;  Laterality: N/A;  . radioactive ablation   1999   thyroid glan  . SPLENECTOMY      Allergies: Allergies as of 05/14/2017  . (No Known Allergies)    Medications:  Current Facility-Administered Medications:  .  acetaminophen (TYLENOL) tablet 650 mg, 650 mg, Oral, Q6H PRN **OR** acetaminophen (TYLENOL) suppository 650 mg, 650 mg, Rectal, Q6H PRN, Gouru, Aruna, MD .  apixaban (ELIQUIS) tablet 5 mg, 5 mg, Oral, BID, Gouru, Aruna, MD, 5 mg at 05/14/17 1312 .  atorvastatin (LIPITOR) tablet 10 mg, 10 mg, Oral, Daily, Gouru, Aruna, MD, 10 mg at 05/14/17 1311 .  dexamethasone (DECADRON) injection 4 mg,  4 mg, Intravenous, Q6H, Gouru, Aruna, MD, 4 mg at 05/14/17 1311 .  diltiazem (CARDIZEM CD) 24 hr capsule 120 mg, 120 mg, Oral, Daily, Gouru, Aruna, MD, 120 mg at 05/14/17 1312 .  escitalopram (LEXAPRO) tablet 10 mg, 10 mg, Oral, Daily, Gouru, Aruna, MD, 10 mg at 05/14/17 1312 .  insulin aspart (novoLOG) injection 0-5 Units, 0-5 Units, Subcutaneous, QHS, Gouru, Aruna, MD .  insulin aspart (novoLOG) injection 0-9 Units, 0-9 Units, Subcutaneous, TID WC, Gouru, Aruna, MD, 1 Units at 05/14/17 1311 .  [START ON 05/15/2017]  levothyroxine (SYNTHROID, LEVOTHROID) tablet 112 mcg, 112 mcg, Oral, Q breakfast, Gouru, Aruna, MD .  magnesium oxide (MAG-OX) tablet 400 mg, 400 mg, Oral, Daily, Gouru, Aruna, MD, 400 mg at 05/14/17 1312 .  MEDLINE mouth rinse, 15 mL, Mouth Rinse, BID, Gouru, Aruna, MD, 15 mL at 05/14/17 1313 .  ondansetron (ZOFRAN) tablet 4 mg, 4 mg, Oral, Q6H PRN **OR** ondansetron (ZOFRAN) injection 4 mg, 4 mg, Intravenous, Q6H PRN, Gouru, Aruna, MD .  ramipril (ALTACE) capsule 10 mg, 10 mg, Oral, Daily, Gouru, Aruna, MD, 10 mg at 05/14/17 1312 .  zolpidem (AMBIEN) tablet 2.5-5 mg, 2.5-5 mg, Oral, QHS PRN, Nicholes Mango, MD   Social History: Social History  Substance Use Topics  . Smoking status: Never Smoker  . Smokeless tobacco: Never Used  . Alcohol use No    Family Medical History: Family History  Problem Relation Age of Onset  . Bladder Cancer Neg Hx   . Kidney cancer Neg Hx     Physical Examination: Vitals:   05/14/17 0904 05/14/17 1208  BP: (!) 166/79 (!) 191/76  Pulse: 85 81  Resp: (!) 23   Temp: 98.2 F (36.8 C) 97.8 F (36.6 C)  SpO2: 94% 100%     General: Patient is well developed, well nourished, calm, collected, and in no apparent distress.  Psychiatric: Patient is non-anxious.  Head:  Pupils equal, round, and reactive to light.  ENT:  Oral mucosa appears well hydrated.  Neck:   Supple.  Full range of motion.  Respiratory: Patient is breathing without any difficulty.  Extremities: No edema.  Vascular: Palpable pulses in dorsal pedal vessels.  Skin:   On exposed skin, there are no abnormal skin lesions.  NEUROLOGICAL:  General: In no acute distress.   Awake, alert, oriented to person, place, and time.  Pupils equal round and reactive to light.  Facial tone is symmetric.  Tongue protrusion is midline.  There is mild left pronator drift.  L homonymous hemianopsia is present  Strength: Side Biceps Triceps Deltoid Interossei Grip Wrist Ext. Wrist Flex.  R 5 5 5  5 5 5 5   L 5 5 5 5 5 5 5    Side Iliopsoas Quads Hamstring PF DF EHL  R 5 5 5 5 5 5   L 5 5 5 5 5 5    Reflexes are 1+ and symmetric at the biceps, triceps, brachioradialis, patella and achilles.   Bilateral upper and lower extremity sensation is intact to light touch and pin prick.  Clonus is not present.  Toes are down-going.  Gait is not tested. Hoffman's is absent.  Imaging: MRI brain 05/14/17 IMPRESSION: 4-5 cm malignancy in the posterior inferior hemisphere on the right at the junction of the posterior temporal lobe, occipital lobe and parietal lobe with extensive areas of central necrosis. Ependymal/subependymal contact of the atrium of the right lateral ventricle. Extensive regional vasogenic edema. Abnormal T2 signal in the right thalamus and splenium of  the corpus callosum could represent edema or nonenhancing tumor. Diagnosis likely glioblastoma multiforme.  Background pattern of extensive chronic small-vessel ischemic changes throughout the brain.   Electronically Signed   By: Nelson Chimes M.D.   On: 05/14/2017 12:12   I have personally reviewed the images and agree with the above interpretation.  Assessment and Plan: Ms. Pellot is a pleasant 83 y.o. female with likely glioblastoma.  I had a long discussion with her regarding this tumor and its prognosis.  She does not desire invasive treatment.    I will discuss her case with my partner Cathleen Fears, and will come back tomorrow to talk with her family.  If she does not desire invasive treatment, I would favor palliative radiation as described by Dr. Baruch Gouty.  Lovie Chol. Izora Ribas MD, [place] [facility] of Neurosurgery

## 2017-05-15 DIAGNOSIS — D693 Immune thrombocytopenic purpura: Secondary | ICD-10-CM

## 2017-05-15 DIAGNOSIS — G936 Cerebral edema: Secondary | ICD-10-CM

## 2017-05-15 DIAGNOSIS — Z9081 Acquired absence of spleen: Secondary | ICD-10-CM

## 2017-05-15 LAB — HEMOGLOBIN A1C
HEMOGLOBIN A1C: 6.1 % — AB (ref 4.8–5.6)
MEAN PLASMA GLUCOSE: 128.37 mg/dL

## 2017-05-15 LAB — CBC
HEMATOCRIT: 34.4 % — AB (ref 35.0–47.0)
HEMOGLOBIN: 11.4 g/dL — AB (ref 12.0–16.0)
MCH: 28.9 pg (ref 26.0–34.0)
MCHC: 33.3 g/dL (ref 32.0–36.0)
MCV: 86.7 fL (ref 80.0–100.0)
Platelets: 375 10*3/uL (ref 150–440)
RBC: 3.96 MIL/uL (ref 3.80–5.20)
RDW: 15.6 % — AB (ref 11.5–14.5)
WBC: 10 10*3/uL (ref 3.6–11.0)

## 2017-05-15 LAB — COMPREHENSIVE METABOLIC PANEL
ALK PHOS: 77 U/L (ref 38–126)
ALT: 14 U/L (ref 14–54)
ANION GAP: 10 (ref 5–15)
AST: 27 U/L (ref 15–41)
Albumin: 3.4 g/dL — ABNORMAL LOW (ref 3.5–5.0)
BILIRUBIN TOTAL: 0.8 mg/dL (ref 0.3–1.2)
BUN: 18 mg/dL (ref 6–20)
CALCIUM: 9.4 mg/dL (ref 8.9–10.3)
CO2: 23 mmol/L (ref 22–32)
Chloride: 103 mmol/L (ref 101–111)
Creatinine, Ser: 0.61 mg/dL (ref 0.44–1.00)
GLUCOSE: 229 mg/dL — AB (ref 65–99)
POTASSIUM: 3.5 mmol/L (ref 3.5–5.1)
Sodium: 136 mmol/L (ref 135–145)
TOTAL PROTEIN: 6.7 g/dL (ref 6.5–8.1)

## 2017-05-15 LAB — GLUCOSE, CAPILLARY
GLUCOSE-CAPILLARY: 231 mg/dL — AB (ref 65–99)
GLUCOSE-CAPILLARY: 224 mg/dL — AB (ref 65–99)
GLUCOSE-CAPILLARY: 232 mg/dL — AB (ref 65–99)
Glucose-Capillary: 304 mg/dL — ABNORMAL HIGH (ref 65–99)
Glucose-Capillary: 215 mg/dL — ABNORMAL HIGH (ref 65–99)

## 2017-05-15 LAB — TSH: TSH: 0.555 u[IU]/mL (ref 0.350–4.500)

## 2017-05-15 MED ORDER — DEXAMETHASONE 4 MG PO TABS: 4.0000 mg | ORAL_TABLET | Freq: Three times a day (TID) | ORAL | 0 refills | Status: AC

## 2017-05-15 NOTE — NC FL2 (Signed)
[place] LEVEL OF CARE SCREENING TOOL     IDENTIFICATION  Patient Name: Rhonda Hurley Birthdate: [birth year] Sex: female Admission Date (Current Location): 05/14/2017  [place] and [place] Number:  Engineering geologist and Address:  [facility], [address], [place], [place] [address]      Provider Number: [id number]  Attending Physician Name and Address:  Hillary Bow, MD  Relative Name and Phone Number:       Current Level of Care: Hospital Recommended Level of Care: [place] Prior Approval Number:    Date Approved/Denied:   PASRR Number:   [id number]  Discharge Plan: SNF    Current Diagnoses: Patient Active Problem List   Diagnosis Date Noted  . Cerebral edema ([place]) 05/14/2017  . AV junctional bradycardia 04/15/2017  . Generalized weakness 04/15/2017  . Acute renal insufficiency 04/15/2017  . Hyperglycemia 04/15/2017  . Lactic acidosis 04/15/2017  . Acute on chronic diastolic CHF (congestive heart failure) ([place]) 06/19/2015  . SOB (shortness of breath) 06/09/2015  . PAF (paroxysmal atrial fibrillation) ([place])   . Derangement of medial meniscus, posterior horn 12/12/2014  . Knee strain 12/02/2014  . Arthritis of knee, degenerative 12/02/2014  . Acquired hypothyroidism 10/14/2014  . Type 2 diabetes mellitus ([place]) 04/08/2014  . Personal history of malignant neoplasm of breast 04/08/2014  . BP (high blood pressure) 04/08/2014  . Paroxysmal atrial fibrillation ([place]) 04/08/2014  . Cough 08/14/2011  . Hyperlipidemia 02/05/2011  . HTN (hypertension) 02/05/2011  . Atrial fibrillation ([place]) 02/05/2011  . Diabetes mellitus ([place]) 02/05/2011    Orientation RESPIRATION BLADDER Height & Weight     Self, Time, Situation, Place  Normal Continent Weight: 154 lb (69.9 kg) Height:  5\' 3"  (160 cm)  BEHAVIORAL SYMPTOMS/MOOD NEUROLOGICAL BOWEL NUTRITION STATUS      Continent Diet ([place] Modified; Fluid Consistency  (Thin))  AMBULATORY STATUS COMMUNICATION OF NEEDS Skin   Extensive Assist Verbally Normal                       Personal Care Assistance Level of Assistance  Bathing, Feeding, Dressing Bathing Assistance: Limited assistance Feeding assistance: Independent Dressing Assistance: Limited assistance     Functional Limitations Info  Sight, Hearing, Speech Sight Info: Adequate Hearing Info: Impaired Speech Info: Adequate    SPECIAL CARE FACTORS FREQUENCY  PT (By licensed PT), OT (By licensed OT)     PT Frequency:  (5) OT Frequency:  (5)            Contractures      Additional Factors Info  Code Status, Allergies (DNR)   Allergies Info:  (None)           Current Medications (05/15/2017):  This is the current hospital active medication list Current Facility-Administered Medications  Medication Dose Route Frequency Provider Last Rate Last Dose  . acetaminophen (TYLENOL) tablet 650 mg  650 mg Oral Q6H PRN Gouru, Aruna, MD       Or  . acetaminophen (TYLENOL) suppository 650 mg  650 mg Rectal Q6H PRN Gouru, Aruna, MD      . apixaban (ELIQUIS) tablet 5 mg  5 mg Oral BID Gouru, Aruna, MD   5 mg at 05/15/17 0807  . atorvastatin (LIPITOR) tablet 10 mg  10 mg Oral Daily Gouru, Aruna, MD   10 mg at 05/15/17 0807  . dexamethasone (DECADRON) injection 4 mg  4 mg Intravenous Q6H Gouru, Aruna, MD   4 mg at 05/15/17 3614  .  diltiazem (CARDIZEM CD) 24 hr capsule 120 mg  120 mg Oral Daily Gouru, Aruna, MD   120 mg at 05/15/17 0808  . escitalopram (LEXAPRO) tablet 10 mg  10 mg Oral Daily Gouru, Aruna, MD   10 mg at 05/15/17 0808  . insulin aspart (novoLOG) injection 0-5 Units  0-5 Units Subcutaneous QHS Nicholes Mango, MD   3 Units at 05/14/17 2202  . insulin aspart (novoLOG) injection 0-9 Units  0-9 Units Subcutaneous TID WC Nicholes Mango, MD   3 Units at 05/15/17 0808  . levothyroxine (SYNTHROID, LEVOTHROID) tablet 112 mcg  112 mcg Oral Q breakfast Gouru, Aruna, MD      . magnesium  oxide (MAG-OX) tablet 400 mg  400 mg Oral Daily Gouru, Aruna, MD   400 mg at 05/15/17 0807  . MEDLINE mouth rinse  15 mL Mouth Rinse BID Gouru, Aruna, MD   15 mL at 05/14/17 2202  . ondansetron (ZOFRAN) tablet 4 mg  4 mg Oral Q6H PRN Gouru, Aruna, MD       Or  . ondansetron (ZOFRAN) injection 4 mg  4 mg Intravenous Q6H PRN Gouru, Aruna, MD      . ramipril (ALTACE) capsule 10 mg  10 mg Oral Daily Gouru, Aruna, MD   10 mg at 05/15/17 3546  . zolpidem (AMBIEN) tablet 2.5-5 mg  2.5-5 mg Oral QHS PRN Nicholes Mango, MD         Discharge Medications: Please see discharge summary for a list of discharge medications.  Relevant Imaging Results:  Relevant Lab Results:   Additional Information  (SSN: [id number])  Smith Mince, Student-Social Work

## 2017-05-15 NOTE — Clinical Social Work Note (Signed)
Clinical Social Work Assessment  Patient Details  Name: Rhonda Hurley MRN: [id number] Date of Birth: 1927-08-27  Date of referral:  05/15/17               Reason for consult:  Facility Placement                Permission sought to share information with:  Chartered certified accountant granted to share information::  Yes, Verbal Permission Granted  Name::      [name]::   [facility]onda [facility]   Relationship::     Contact Information:     Housing/Transportation Living arrangements for the past 2 months:  [name] of Information:  Patient Patient Interpreter Needed:  None Criminal Activity/Legal Involvement Pertinent to Current Situation/Hospitalization:  No - Comment as needed Significant Relationships:  Adult Children, Spouse Lives with:  Spouse Do you feel safe going back to the place where you live?  Yes Need for family participation in patient care:  Yes (Comment)  Care giving concerns:  Patient lives with her husband Rhonda Hurley at [facility] independent living.    Social Worker assessment / plan:  Holiday representative ([place]) reviewed chart and noted that patient is from [facility]. CSW and social work Theatre manager met with patient alone at bedside to discuss D/C plan. Patient was alert and oriented X4 and was sitting up in the bed. CSW introduced self and explained role of CSW department. Patient reported that she just found out that she has a brain tumor. CSW provided emotional support. Patient reported that she is pretty calm about it and has a good life and a good marriage to her husband of 74 years. Patient reported that she cares for her husband who has mild dementia. Patient stated that she would like to go to [facility] for short term rehab. CSW explained that her insurance Health Team would have to approve SNF. Patient verbalized her understanding. CSW discussed long term care SNF placement with patient.  Per patient she has a long term care policy. CSW explained that the long term care policy will likely pay a per diem per day and she will have to pay for the rest. Patient verbalized her understanding. Patient reported that her daughter [facility] is her HPOA and in [place] now. FL2 complete, Health Team case manager aware of above. CSW will continue to follow and assist as needed.   Employment status:  Disabled (Comment on whether or not currently receiving Disability) Insurance information:  Managed Medicare PT Recommendations:  Not assessed at this time Information / Referral to community resources:  [facility]  Patient/Family's Response to care:  Patient prefers to go to [facility].   Patient/Family's Understanding of and Emotional Response to Diagnosis, Current Treatment, and Prognosis:  Patient was very pleasant and thanked CSW for visit.   Emotional Assessment Appearance:  Appears stated age Attitude/Demeanor/Rapport:    Affect (typically observed):  Accepting, Adaptable, Pleasant Orientation:  Oriented to Self, Oriented to Place, Oriented to  Time, Oriented to Situation Alcohol / Substance use:  Not Applicable Psych involvement (Current and /or in the community):  No (Comment)  Discharge Needs  Concerns to be addressed:  Discharge Planning Concerns Readmission within the last 30 days:  No Current discharge risk:  Dependent with Mobility Barriers to Discharge:  Continued Medical Work up   [employer], Rhonda Beets, LCSW 05/15/2017, 3:09 PM

## 2017-05-15 NOTE — Progress Notes (Signed)
Advanced care planning  Returned to Meet with patient, husband and daughter at bedside. She is tearful and as is daughter. Discussed regarding glioblastoma, cerebral edema. She has decided not to pursue aggressive surgery. Is still considering radiation vs no treatment. Radiation seems reasonable option. Will continue decadron  Code status confirmed is DNR.  She wants to be able to get back to [place] at discharge. SNF at discharge  Time spent 20 minutes

## 2017-05-15 NOTE — Progress Notes (Signed)
PT Cancellation Note  Patient Details Name: Rhonda Hurley MRN: [id number] DOB: 09/24/1926   Cancelled Treatment:    Reason Eval/Treat Not Completed: Patient declined, no reason specified.  PT consult received.  Chart reviewed.  Nursing cleared pt for participation in PT (nursing reports pt has been walking to bathroom with walker and 1 assist).  Pt declining PT at this time (pt reporting recently receiving news of terminal diagnosis and needed time today to process information).  Pt agreeable to PT attempting tomorrow.  Will re-attempt PT tomorrow as appropriate.  Nursing and CM notified of above.  Leitha Bleak, PT 05/15/17, 1:21 PM [phone]

## 2017-05-15 NOTE — Discharge Instructions (Addendum)
Resume diet and activity as before ° ° °

## 2017-05-15 NOTE — Evaluation (Signed)
Physical Therapy Evaluation Patient Details Name: SUMAN TRIVEDI MRN: [id number] DOB: 1927-06-08 Today's Date: 05/15/2017   History of Present Illness  Pt is an 83 y.o. female presenting to hospital s/p fall off toilet (reaching forward to grab toilet paper) with head trauma (also small bruise L forehead and L knee abrasion).  CT of head showing 4-5 cm malignancy R temporal/parietal/occiptial region with significant edema (concerning for metastatic disease); no midline shift.  Pt reporting recent gait instability and veering to L.  Per notes, likely glioblastoma.  PMH includes CVA, chronic diastolic CHF, htn, junctional bradycardia, L BBB, mitral valve disorder, TIA, DM, paroxysmal a-fib, derangement of medial meniscus posterior horn, s/p L breast lumpectomy (h/o breast CA with chemo).  Clinical Impression  Prior to hospital admission, pt was modified independent ambulating with rollator.  Pt lives with her husband at [place].  Currently pt is CGA to min assist with transfers and CGA to min assist ambulating with RW (pt looked left while walking in hallway to find her room and had significant loss of balance to R requiring mod assist of therapist to prevent fall).  Pt demonstrates L visual field deficits (peripheral vision and tracking to L side) which complicates pt's ability to perform safe mobility (pt had difficulty finding items on L and tending to catch her walker on objects on L side requiring vc's and assist for safety).  Pt would benefit from skilled PT to address noted impairments and functional limitations (see below for any additional details).  Upon hospital discharge, recommend pt discharge to STR (CM, SW, and MD notified).    Follow Up Recommendations SNF    Equipment Recommendations  Rolling walker with 5" wheels    Recommendations for Other Services OT consult     Precautions / Restrictions Precautions Precautions: Fall Restrictions Weight Bearing  Restrictions: No      Mobility  Bed Mobility Overal bed mobility: Needs Assistance Bed Mobility: Supine to Sit;Sit to Supine     Supine to sit: Supervision;HOB elevated Sit to supine: Supervision;HOB elevated   General bed mobility comments: SBA for safety  Transfers Overall transfer level: Needs assistance Equipment used: Rolling walker (2 wheeled) Transfers: Sit to/from [facility] Sit to Stand: Min guard;Min assist Stand pivot transfers: Min guard       General transfer comment: CGA standing from bed with RW; min assist to stand from toilet with grab bar; stand step turn with RW to toilet CGA with vc's for safety d/t impaired L visual field  Ambulation/Gait Ambulation/Gait assistance: Min guard;Min assist;Mod assist Ambulation Distance (Feet):  (30 feet with RW; 100 feet with RW) Assistive device: Rolling walker (2 wheeled)   Gait velocity: decreased   General Gait Details: pt initially CGA but with distance pt with intermittent mild crossing of feet and unsteadiness to R requiring intermittent min assist to stead; pt looking L for room and had significant loss of balance to R requiring mod assist to prevent fall  Stairs            Wheelchair Mobility    Modified Rankin (Stroke Patients Only)       Balance Overall balance assessment: Needs assistance Sitting-balance support: No upper extremity supported;Feet supported Sitting balance-Leahy Scale: Good Sitting balance - Comments: sitting reaching within BOS (limitations noted reaching to L and pt with difficulty finding target and needing to turn head to find target)   Standing balance support: No upper extremity supported Standing balance-Leahy Scale: Poor Standing balance comment:  CGA to min assist for balance (standing reaching just outside BOS) especially reaching to L side (pt with difficulty finding target and needing to turn head to find target)                              Pertinent Vitals/Pain Pain Assessment: No/denies pain  Vitals (HR and O2 on room air) stable and WFL throughout treatment session.    Home Living Family/patient expects to be discharged to:: Private residence ([place]) Living Arrangements: Spouse/significant other Available Help at Discharge: Family   Home Access: Level entry     Hecker: One level   Additional Comments: Independent Living at [facility]    Prior Function Level of Independence: Needs assistance   Gait / Transfers Assistance Needed: Pt ambulating with rollator.  ADL's / Homemaking Assistance Needed: Daughter assists with medications and cooking; [facility] comes 2-3x's per week to assist with cleaning and showers for pt and pt's husband.  Comments: Pt reporting recent difficulty balancing her checkbook.     Hand Dominance        Extremity/Trunk Assessment   Upper Extremity Assessment Upper Extremity Assessment:  (at least 3/5 AROM overall (deferred MMT d/t concerns for metastatic disease))    Lower Extremity Assessment Lower Extremity Assessment:  (at least 3/5 AROM overall (deferred MMT d/t concerns for metastatic disease);  mild decreased L LE heel to shin coordination; intact B LE proprioception and tone; decreased B sensation lateral foot and plantar surface great toes which is baseline))    Cervical / Trunk Assessment Cervical / Trunk Assessment: Normal  Communication   Communication: HOH  Cognition Arousal/Alertness: Awake/alert Behavior During Therapy: WFL for tasks assessed/performed Overall Cognitive Status: Within Functional Limits for tasks assessed                                        General Comments General comments (skin integrity, edema, etc.): Pt's daughter and pt's husband present during session.  MD called PT to attempt to see pt again this afternoon (MD reports discussing PT with pt). Nursing cleared pt for participation in  physical therapy.  Pt agreeable to PT session.     Exercises     Assessment/Plan    PT Assessment Patient needs continued PT services  PT Problem List Decreased strength;Decreased balance;Decreased mobility;Decreased safety awareness;Decreased knowledge of precautions       PT Treatment Interventions DME instruction;Gait training;Functional mobility training;Therapeutic activities;Therapeutic exercise;Balance training;Patient/family education    PT Goals (Current goals can be found in the Care Plan section)  Acute Rehab PT Goals Patient Stated Goal: to improve balance PT Goal Formulation: With patient Time For Goal Achievement: 05/29/17 Potential to Achieve Goals: Fair    Frequency Min 2X/week   Barriers to discharge Decreased caregiver support      Co-evaluation               AM-PAC PT "6 Clicks" Daily Activity  Outcome Measure Difficulty turning over in bed (including adjusting bedclothes, sheets and blankets)?: A Little Difficulty moving from lying on back to sitting on the side of the bed? : A Little Difficulty sitting down on and standing up from a chair with arms (e.g., wheelchair, bedside commode, etc,.)?: Unable Help needed moving to and from a bed to chair (including a wheelchair)?: A Little Help needed walking  in hospital room?: A Little Help needed climbing 3-5 steps with a railing? : A Lot 6 Click Score: 15    End of Session Equipment Utilized During Treatment: Gait belt Activity Tolerance: Patient tolerated treatment well Patient left: in bed;with call bell/phone within reach;with bed alarm set;with family/visitor present Nurse Communication: Mobility status;Precautions PT Visit Diagnosis: Other abnormalities of gait and mobility (R26.89);History of falling (Z91.81)    Time: 2395-3202 PT Time Calculation (min) (ACUTE ONLY): 40 min   Charges:   PT Evaluation $PT Eval Low Complexity: 1 Low PT Treatments $Therapeutic Activity: 8-22 mins   PT G  Codes:   PT G-Codes **NOT FOR INPATIENT CLASS** Functional Assessment Tool Used: AM-PAC 6 Clicks Basic Mobility Functional Limitation: Mobility: Walking and moving around Mobility: Walking and Moving Around Current Status (B3435): At least 40 percent but less than 60 percent impaired, limited or restricted Mobility: Walking and Moving Around Goal Status [phone]): At least 1 percent but less than 20 percent impaired, limited or restricted    Leitha Bleak, PT 05/15/17, 4:59 PM [phone]

## 2017-05-15 NOTE — Plan of Care (Signed)
Problem: Education: Goal: Knowledge of [facility] Education information/materials will improve Outcome: Progressing VSS, free of falls during shift.  Denies pain.  Ambulated to [facility], tolerated well.  Bed in low position, call bell within reach.  WCTM.

## 2017-05-15 NOTE — Progress Notes (Signed)
[facility] NAME: Rhonda Hurley    MR#:  [id number]  DATE OF BIRTH:  08/02/1927  SUBJECTIVE:  CHIEF COMPLAINT:   Chief Complaint  Patient presents with  . Fall   Feels unsteady and weak. NO pain  REVIEW OF SYSTEMS:    Review of Systems  Constitutional: Positive for malaise/fatigue. Negative for chills and fever.  HENT: Negative for sore throat.   Eyes: Negative for blurred vision, double vision and pain.  Respiratory: Negative for cough, hemoptysis, shortness of breath and wheezing.   Cardiovascular: Negative for chest pain, palpitations, orthopnea and leg swelling.  Gastrointestinal: Negative for abdominal pain, constipation, diarrhea, heartburn, nausea and vomiting.  Genitourinary: Negative for dysuria and hematuria.  Musculoskeletal: Negative for back pain and joint pain.  Skin: Negative for rash.  Neurological: Negative for sensory change, speech change, focal weakness and headaches.  Endo/Heme/Allergies: Does not bruise/bleed easily.  Psychiatric/Behavioral: Negative for depression. The patient is not nervous/anxious.     DRUG ALLERGIES:  No Known Allergies  VITALS:  Blood pressure (!) 155/65, pulse 94, temperature 98.6 F (37 C), resp. rate 18, height 5\' 3"  (1.6 m), weight 69.9 kg (154 lb), SpO2 98 %.  PHYSICAL EXAMINATION:   Physical Exam  GENERAL:  83 y.o.-year-old patient lying in the bed with no acute distress.  EYES: Pupils equal, round, reactive to light and accommodation. No scleral icterus. Extraocular muscles intact.  HEENT: Head atraumatic, normocephalic. Oropharynx and nasopharynx clear.  NECK:  Supple, no jugular venous distention. No thyroid enlargement, no tenderness.  LUNGS: Normal breath sounds bilaterally, no wheezing, rales, rhonchi. No use of accessory muscles of respiration.  CARDIOVASCULAR: S1, S2 normal. No murmurs, rubs, or gallops.  ABDOMEN: Soft, nontender, nondistended. Bowel sounds  present. No organomegaly or mass.  EXTREMITIES: No cyanosis, clubbing or edema b/l.    NEUROLOGIC: Cranial nerves II through XII are intact. No focal Motor or sensory deficits b/l.   PSYCHIATRIC: The patient is alert and oriented x 3.  SKIN: No obvious rash, lesion, or ulcer.   LABORATORY PANEL:   CBC  Recent Labs Lab 05/15/17 0320  WBC 10.0  HGB 11.4*  HCT 34.4*  PLT 375   ------------------------------------------------------------------------------------------------------------------ Chemistries   Recent Labs Lab 05/15/17 0320  NA 136  K 3.5  CL 103  CO2 23  GLUCOSE 229*  BUN 18  CREATININE 0.61  CALCIUM 9.4  AST 27  ALT 14  ALKPHOS 77  BILITOT 0.8   ------------------------------------------------------------------------------------------------------------------  Cardiac Enzymes No results for input(s): TROPONINI in the last 168 hours. ------------------------------------------------------------------------------------------------------------------  RADIOLOGY:  Ct Head Wo Contrast  Result Date: 05/14/2017 CLINICAL DATA:  83 year old female with history of trauma after falling off of a toilet. Dizziness and head pain. EXAM: CT HEAD WITHOUT CONTRAST CT CERVICAL SPINE WITHOUT CONTRAST TECHNIQUE: Multidetector CT imaging of the head and cervical spine was performed following the standard protocol without intravenous contrast. Multiplanar CT image reconstructions of the cervical spine were also generated. COMPARISON:  Head CT 11/14/2012. FINDINGS: CT HEAD FINDINGS Brain: In the occipital region there is a relatively well-defined 1.9 x 1.2 x 1.5 cm low-attenuation lesion (axial image 16 of series 2 and coronal image 51 of series 4). Slightly caudal to this also in the occipital region there is a smaller well-defined low-attenuation lesion measuring 1.0 x 1.3 x 0.9 cm (axial image 14 of series 2 and coronal image 46 of series 4). In the adjacent brain parenchyma throughout  the right parietal,  occipital and posterior temple regions there is extensive low attenuation, loss of gray-white junction, and effacement of the overlying sulci, compatible with underlying edema. No significant midline shift or evidence of uncal herniation noted at this time. Mild cerebral edema. Patchy and confluent areas of decreased attenuation are noted throughout the deep and periventricular white matter of the cerebral hemispheres bilaterally, compatible with chronic microvascular ischemic disease. No hydrocephalus. Vascular: No hyperdense vessel or unexpected calcification. Skull: Normal. Negative for fracture or focal lesion. Sinuses/Orbits: No acute finding. Other: None. CT CERVICAL SPINE FINDINGS Alignment: Exaggeration of normal cervical lordosis. Otherwise, normal. Skull base and vertebrae: No acute fracture. No primary bone lesion or focal pathologic process. Soft tissues and spinal canal: No prevertebral fluid or swelling. No visible canal hematoma. Disc levels: Multilevel degenerative disc disease, most severe at C5-C6. Moderate multilevel facet arthropathy. Upper chest: Visualized portions are unremarkable. Other: None. IMPRESSION: 1. There are 2 low-attenuation lesions in the right occipital lobe with extensive surrounding cerebral edema in adjacent portions of the right occipital, parietal and posterior temporal regions. Findings are highly concerning for metastatic disease to the brain, but could alternatively reflect the presence of small abscesses. Further evaluation with brain MRI with and without IV gadolinium is strongly recommended at this time. 2. No evidence of significant acute traumatic injury to the skull, brain or cervical spine. 3. Mild cerebral atrophy with extensive chronic microvascular ischemic changes in the cerebral white matter. 4. Mild multilevel degenerative disc disease and cervical spondylosis, as above. These results were called by telephone at the time of interpretation  on 05/14/2017 at 10:08 am to Dr. Rudene Re, who verbally acknowledged these results. Electronically Signed   By: Vinnie Langton M.D.   On: 05/14/2017 10:10   Ct Chest W Contrast  Result Date: 05/14/2017 CLINICAL DATA:  Possible metastatic breast cancer. EXAM: CT CHEST WITH CONTRAST TECHNIQUE: Multidetector CT imaging of the chest was performed during intravenous contrast administration. CONTRAST:  34mL ISOVUE-300 IOPAMIDOL (ISOVUE-300) INJECTION 61% COMPARISON:  Radiographs of April 15, 2017. FINDINGS: Cardiovascular: Atherosclerosis of thoracic aorta is noted without aneurysm or dissection. Coronary artery calcifications are noted. Normal cardiac size. No pericardial effusion is noted. Mediastinum/Nodes: No enlarged mediastinal, hilar, or axillary lymph nodes. Thyroid gland, trachea, and esophagus demonstrate no significant findings. Lungs/Pleura: Lungs are clear. No pleural effusion or pneumothorax. Upper Abdomen: No acute abnormality. Musculoskeletal: No chest wall abnormality. No acute or significant osseous findings. IMPRESSION: Coronary artery calcifications are noted suggesting coronary artery disease. No acute abnormality seen in the chest. Aortic Atherosclerosis (ICD10-I70.0). Electronically Signed   By: Marijo Conception, M.D.   On: 05/14/2017 14:30   Ct Cervical Spine Wo Contrast  Result Date: 05/14/2017 CLINICAL DATA:  83 year old female with history of trauma after falling off of a toilet. Dizziness and head pain. EXAM: CT HEAD WITHOUT CONTRAST CT CERVICAL SPINE WITHOUT CONTRAST TECHNIQUE: Multidetector CT imaging of the head and cervical spine was performed following the standard protocol without intravenous contrast. Multiplanar CT image reconstructions of the cervical spine were also generated. COMPARISON:  Head CT 11/14/2012. FINDINGS: CT HEAD FINDINGS Brain: In the occipital region there is a relatively well-defined 1.9 x 1.2 x 1.5 cm low-attenuation lesion (axial image 16 of series  2 and coronal image 51 of series 4). Slightly caudal to this also in the occipital region there is a smaller well-defined low-attenuation lesion measuring 1.0 x 1.3 x 0.9 cm (axial image 14 of series 2 and coronal image 46 of series 4). In the  adjacent brain parenchyma throughout the right parietal, occipital and posterior temple regions there is extensive low attenuation, loss of gray-white junction, and effacement of the overlying sulci, compatible with underlying edema. No significant midline shift or evidence of uncal herniation noted at this time. Mild cerebral edema. Patchy and confluent areas of decreased attenuation are noted throughout the deep and periventricular white matter of the cerebral hemispheres bilaterally, compatible with chronic microvascular ischemic disease. No hydrocephalus. Vascular: No hyperdense vessel or unexpected calcification. Skull: Normal. Negative for fracture or focal lesion. Sinuses/Orbits: No acute finding. Other: None. CT CERVICAL SPINE FINDINGS Alignment: Exaggeration of normal cervical lordosis. Otherwise, normal. Skull base and vertebrae: No acute fracture. No primary bone lesion or focal pathologic process. Soft tissues and spinal canal: No prevertebral fluid or swelling. No visible canal hematoma. Disc levels: Multilevel degenerative disc disease, most severe at C5-C6. Moderate multilevel facet arthropathy. Upper chest: Visualized portions are unremarkable. Other: None. IMPRESSION: 1. There are 2 low-attenuation lesions in the right occipital lobe with extensive surrounding cerebral edema in adjacent portions of the right occipital, parietal and posterior temporal regions. Findings are highly concerning for metastatic disease to the brain, but could alternatively reflect the presence of small abscesses. Further evaluation with brain MRI with and without IV gadolinium is strongly recommended at this time. 2. No evidence of significant acute traumatic injury to the skull,  brain or cervical spine. 3. Mild cerebral atrophy with extensive chronic microvascular ischemic changes in the cerebral white matter. 4. Mild multilevel degenerative disc disease and cervical spondylosis, as above. These results were called by telephone at the time of interpretation on 05/14/2017 at 10:08 am to Dr. Rudene Re, who verbally acknowledged these results. Electronically Signed   By: Vinnie Langton M.D.   On: 05/14/2017 10:10   Mr Brain W And Wo Contrast  Result Date: 05/14/2017 CLINICAL DATA:  [place] in the bathroom.  Abnormal head CT. EXAM: MRI HEAD WITHOUT AND WITH CONTRAST TECHNIQUE: Multiplanar, multiecho pulse sequences of the brain and surrounding structures were obtained without and with intravenous contrast. CONTRAST:  7mL MULTIHANCE GADOBENATE DIMEGLUMINE 529 MG/ML IV SOLN COMPARISON:  CT same day.  MRI 11/14/2012. FINDINGS: Brain: There is a 4-5 cm in diameter mass lesion in the posterior inferior cerebral hemisphere on the right at the junction of the posterior temporal lobe, occipital lobe and parietal lobe with internal necrosis and irregular margins consistent with primary brain malignancy/glioblastoma multiforme. The lesion has a broad surface along the region of the atrium of the right lateral ventricle and there is presumably ependymal or subependymal extension. There is regional mass effect secondary to extensive vasogenic edema. Some thickening and edema in the splenium of the corpus callosum could represent edema or evidence of crossing tumor. No enhancement in that region. No other satellite foci of enhancement. Asymmetric T2 signal in the right thalamus could be edema or tumor infiltration. Elsewhere, the brain shows extensive chronic small-vessel ischemic changes throughout the hemispheric white matter. No hydrocephalus. No ventricular trapping. No extra-axial fluid collection. Vascular: Major vessels at the base of the brain show flow. Skull and upper cervical spine:  Negative Sinuses/Orbits: Clear/normal Other: None significant IMPRESSION: 4-5 cm malignancy in the posterior inferior hemisphere on the right at the junction of the posterior temporal lobe, occipital lobe and parietal lobe with extensive areas of central necrosis. Ependymal/subependymal contact of the atrium of the right lateral ventricle. Extensive regional vasogenic edema. Abnormal T2 signal in the right thalamus and splenium of the corpus callosum could represent  edema or nonenhancing tumor. Diagnosis likely glioblastoma multiforme. Background pattern of extensive chronic small-vessel ischemic changes throughout the brain. Electronically Signed   By: Nelson Chimes M.D.   On: 05/14/2017 12:12    ASSESSMENT AND PLAN:   Rhonda Hurley  is a 83 y.o. female with a known history ofdiabetes mellitus, hypertension, history of breast cancer status post chemotherapy 20 years ago currently cancer free, chronic atrial fibrillation on a eliquis is brought [facility] the hospital [facility] [place] [facility] after she sustained a fall  # Glioblastoma with cerebral edema ON decadron. Seen by Neurosurgery, radiation oncology and oncology. Patient trying to decide between her options Discussed with Dr. Tish Men  #chronic atrial fibrillation Rate controlled continue home medication Cardizem CD and eliquis  #Hypothyroidism continue Synthroid  #Essential hypertension continue home medication altace and Cardizem  #diabetes mellitus non-insulin requiring SSI  All the records are reviewed and case discussed with Care Management/Social Workerr. Management plans discussed with the patient, family and they are in agreement.  CODE STATUS: DNR  DVT Prophylaxis: SCDs  TOTAL TIME TAKING CARE OF THIS PATIENT: 30 minutes.   POSSIBLE D/C IN 1-2 DAYS, DEPENDING ON CLINICAL CONDITION.  Hillary Bow R M.D on 05/15/2017 at 7:42 PM  Between 7am to 6pm - Pager - [phone]  After 6pm go to  [email or web address] - password [email or web address] [facility]  Office  [phone]  CC: Primary care physician; Juluis Pitch, MD  Note: This dictation was prepared with Dragon dictation along with smaller phrase technology. Any transcriptional errors that result from this process are unintentional.

## 2017-05-15 NOTE — Progress Notes (Signed)
Rhonda Hurley   DOB:[birth year]   UM#:[id number]    Subjective: Patient resting in the bed. Upset that her breakfast has not arrived. Otherwise denies any headaches. She continues to have difficulty walking/gait instability.  Objective:  Vitals:   05/15/17 0545 05/15/17 1317  BP: (!) 151/68 (!) 155/65  Pulse: 78 94  Resp: 16 18  Temp: (!) 97.5 F (36.4 C) 98.6 F (37 C)  SpO2: 96% 98%     Intake/Output Summary (Last 24 hours) at 05/15/17 1936 Last data filed at 05/15/17 1354  Gross per 24 hour  Intake              240 ml  Output                0 ml  Net              240 ml    GENERAL Alert, no distress and comfortable.  EYES: no pallor or icterus OROPHARYNX: no thrush or ulceration. NECK: supple, no masses felt LYMPH:  no palpable lymphadenopathy in the cervical, axillary or inguinal regions LUNGS: decreased breath sounds to auscultation at bases and  No wheeze or crackles HEART/CVS: regular rate & rhythm and no murmurs; No lower extremity edema ABDOMEN: abdomen soft, tender  on deep palpation. and normal bowel sounds Musculoskeletal:no cyanosis of digits and no clubbing  PSYCH: alert & oriented x 3 with fluent speech NEURO: no focal motor/sensory deficits SKIN:  no rashes or significant lesions   Labs:  Lab Results  Component Value Date   WBC 10.0 05/15/2017   HGB 11.4 (L) 05/15/2017   HCT 34.4 (L) 05/15/2017   MCV 86.7 05/15/2017   PLT 375 05/15/2017   NEUTROABS 10.4 (H) 05/14/2017    Lab Results  Component Value Date   NA 136 05/15/2017   K 3.5 05/15/2017   CL 103 05/15/2017   CO2 23 05/15/2017    Studies:  Ct Head Wo Contrast  Result Date: 05/14/2017 CLINICAL DATA:  83 year old female with history of trauma after falling off of a toilet. Dizziness and head pain. EXAM: CT HEAD WITHOUT CONTRAST CT CERVICAL SPINE WITHOUT CONTRAST TECHNIQUE: Multidetector CT imaging of the head and cervical spine was performed following the standard protocol without  intravenous contrast. Multiplanar CT image reconstructions of the cervical spine were also generated. COMPARISON:  Head CT 11/14/2012. FINDINGS: CT HEAD FINDINGS Brain: In the occipital region there is a relatively well-defined 1.9 x 1.2 x 1.5 cm low-attenuation lesion (axial image 16 of series 2 and coronal image 51 of series 4). Slightly caudal to this also in the occipital region there is a smaller well-defined low-attenuation lesion measuring 1.0 x 1.3 x 0.9 cm (axial image 14 of series 2 and coronal image 46 of series 4). In the adjacent brain parenchyma throughout the right parietal, occipital and posterior temple regions there is extensive low attenuation, loss of gray-white junction, and effacement of the overlying sulci, compatible with underlying edema. No significant midline shift or evidence of uncal herniation noted at this time. Mild cerebral edema. Patchy and confluent areas of decreased attenuation are noted throughout the deep and periventricular white matter of the cerebral hemispheres bilaterally, compatible with chronic microvascular ischemic disease. No hydrocephalus. Vascular: No hyperdense vessel or unexpected calcification. Skull: Normal. Negative for fracture or focal lesion. Sinuses/Orbits: No acute finding. Other: None. CT CERVICAL SPINE FINDINGS Alignment: Exaggeration of normal cervical lordosis. Otherwise, normal. Skull base and vertebrae: No acute fracture. No primary bone lesion or focal  pathologic process. Soft tissues and spinal canal: No prevertebral fluid or swelling. No visible canal hematoma. Disc levels: Multilevel degenerative disc disease, most severe at C5-C6. Moderate multilevel facet arthropathy. Upper chest: Visualized portions are unremarkable. Other: None. IMPRESSION: 1. There are 2 low-attenuation lesions in the right occipital lobe with extensive surrounding cerebral edema in adjacent portions of the right occipital, parietal and posterior temporal regions. Findings  are highly concerning for metastatic disease to the brain, but could alternatively reflect the presence of small abscesses. Further evaluation with brain MRI with and without IV gadolinium is strongly recommended at this time. 2. No evidence of significant acute traumatic injury to the skull, brain or cervical spine. 3. Mild cerebral atrophy with extensive chronic microvascular ischemic changes in the cerebral white matter. 4. Mild multilevel degenerative disc disease and cervical spondylosis, as above. These results were called by telephone at the time of interpretation on 05/14/2017 at 10:08 am to Dr. Rudene Re, who verbally acknowledged these results. Electronically Signed   By: Vinnie Langton M.D.   On: 05/14/2017 10:10   Ct Chest W Contrast  Result Date: 05/14/2017 CLINICAL DATA:  Possible metastatic breast cancer. EXAM: CT CHEST WITH CONTRAST TECHNIQUE: Multidetector CT imaging of the chest was performed during intravenous contrast administration. CONTRAST:  57mL ISOVUE-300 IOPAMIDOL (ISOVUE-300) INJECTION 61% COMPARISON:  Radiographs of April 15, 2017. FINDINGS: Cardiovascular: Atherosclerosis of thoracic aorta is noted without aneurysm or dissection. Coronary artery calcifications are noted. Normal cardiac size. No pericardial effusion is noted. Mediastinum/Nodes: No enlarged mediastinal, hilar, or axillary lymph nodes. Thyroid gland, trachea, and esophagus demonstrate no significant findings. Lungs/Pleura: Lungs are clear. No pleural effusion or pneumothorax. Upper Abdomen: No acute abnormality. Musculoskeletal: No chest wall abnormality. No acute or significant osseous findings. IMPRESSION: Coronary artery calcifications are noted suggesting coronary artery disease. No acute abnormality seen in the chest. Aortic Atherosclerosis (ICD10-I70.0). Electronically Signed   By: Marijo Conception, M.D.   On: 05/14/2017 14:30   Ct Cervical Spine Wo Contrast  Result Date: 05/14/2017 CLINICAL DATA:   83 year old female with history of trauma after falling off of a toilet. Dizziness and head pain. EXAM: CT HEAD WITHOUT CONTRAST CT CERVICAL SPINE WITHOUT CONTRAST TECHNIQUE: Multidetector CT imaging of the head and cervical spine was performed following the standard protocol without intravenous contrast. Multiplanar CT image reconstructions of the cervical spine were also generated. COMPARISON:  Head CT 11/14/2012. FINDINGS: CT HEAD FINDINGS Brain: In the occipital region there is a relatively well-defined 1.9 x 1.2 x 1.5 cm low-attenuation lesion (axial image 16 of series 2 and coronal image 51 of series 4). Slightly caudal to this also in the occipital region there is a smaller well-defined low-attenuation lesion measuring 1.0 x 1.3 x 0.9 cm (axial image 14 of series 2 and coronal image 46 of series 4). In the adjacent brain parenchyma throughout the right parietal, occipital and posterior temple regions there is extensive low attenuation, loss of gray-white junction, and effacement of the overlying sulci, compatible with underlying edema. No significant midline shift or evidence of uncal herniation noted at this time. Mild cerebral edema. Patchy and confluent areas of decreased attenuation are noted throughout the deep and periventricular white matter of the cerebral hemispheres bilaterally, compatible with chronic microvascular ischemic disease. No hydrocephalus. Vascular: No hyperdense vessel or unexpected calcification. Skull: Normal. Negative for fracture or focal lesion. Sinuses/Orbits: No acute finding. Other: None. CT CERVICAL SPINE FINDINGS Alignment: Exaggeration of normal cervical lordosis. Otherwise, normal. Skull base and vertebrae: No acute  fracture. No primary bone lesion or focal pathologic process. Soft tissues and spinal canal: No prevertebral fluid or swelling. No visible canal hematoma. Disc levels: Multilevel degenerative disc disease, most severe at C5-C6. Moderate multilevel facet  arthropathy. Upper chest: Visualized portions are unremarkable. Other: None. IMPRESSION: 1. There are 2 low-attenuation lesions in the right occipital lobe with extensive surrounding cerebral edema in adjacent portions of the right occipital, parietal and posterior temporal regions. Findings are highly concerning for metastatic disease to the brain, but could alternatively reflect the presence of small abscesses. Further evaluation with brain MRI with and without IV gadolinium is strongly recommended at this time. 2. No evidence of significant acute traumatic injury to the skull, brain or cervical spine. 3. Mild cerebral atrophy with extensive chronic microvascular ischemic changes in the cerebral white matter. 4. Mild multilevel degenerative disc disease and cervical spondylosis, as above. These results were called by telephone at the time of interpretation on 05/14/2017 at 10:08 am to Dr. Rudene Re, who verbally acknowledged these results. Electronically Signed   By: Vinnie Langton M.D.   On: 05/14/2017 10:10   Mr Brain W And Wo Contrast  Result Date: 05/14/2017 CLINICAL DATA:  [place] in the bathroom.  Abnormal head CT. EXAM: MRI HEAD WITHOUT AND WITH CONTRAST TECHNIQUE: Multiplanar, multiecho pulse sequences of the brain and surrounding structures were obtained without and with intravenous contrast. CONTRAST:  69mL MULTIHANCE GADOBENATE DIMEGLUMINE 529 MG/ML IV SOLN COMPARISON:  CT same day.  MRI 11/14/2012. FINDINGS: Brain: There is a 4-5 cm in diameter mass lesion in the posterior inferior cerebral hemisphere on the right at the junction of the posterior temporal lobe, occipital lobe and parietal lobe with internal necrosis and irregular margins consistent with primary brain malignancy/glioblastoma multiforme. The lesion has a broad surface along the region of the atrium of the right lateral ventricle and there is presumably ependymal or subependymal extension. There is regional mass effect secondary  to extensive vasogenic edema. Some thickening and edema in the splenium of the corpus callosum could represent edema or evidence of crossing tumor. No enhancement in that region. No other satellite foci of enhancement. Asymmetric T2 signal in the right thalamus could be edema or tumor infiltration. Elsewhere, the brain shows extensive chronic small-vessel ischemic changes throughout the hemispheric white matter. No hydrocephalus. No ventricular trapping. No extra-axial fluid collection. Vascular: Major vessels at the base of the brain show flow. Skull and upper cervical spine: Negative Sinuses/Orbits: Clear/normal Other: None significant IMPRESSION: 4-5 cm malignancy in the posterior inferior hemisphere on the right at the junction of the posterior temporal lobe, occipital lobe and parietal lobe with extensive areas of central necrosis. Ependymal/subependymal contact of the atrium of the right lateral ventricle. Extensive regional vasogenic edema. Abnormal T2 signal in the right thalamus and splenium of the corpus callosum could represent edema or nonenhancing tumor. Diagnosis likely glioblastoma multiforme. Background pattern of extensive chronic small-vessel ischemic changes throughout the brain. Electronically Signed   By: Nelson Chimes M.D.   On: 05/14/2017 12:12    Assessment & Plan:   # 83 year old female patient currently admitted to the hospital for fall. CT scan/MRI suggestive of malignancy  # Fall/recent Instability- 4-5 cm right temporal/parietal/occipital region with significant edema. MRI concerning for primary brain tumor-like GBM versus others. Continue steroids for the edema. Patient is leaning towards nonaggressive/invasive interventions.  Appreciate neurosurgical-radiation-oncology evaluation. Discussed with Dr. Izora Ribas; and Dr.Crystal.  Recommend palliative care evaluation.   # Remote history of breast cancer- no history of  recurrence/or any new primary based on CT of the chest and  pelvis.  #  History of chronic ITP - status post splenectomy.   # Discussed with Dr. Darvin Neighbours.  Cammie Sickle, MD 05/15/2017  7:36 PM

## 2017-05-15 NOTE — Care Management Note (Signed)
Case Management Note  Patient Details  Name: Rhonda Hurley MRN: [id number] Date of Birth: Mar 26, 1927  Subjective/Objective:   Admitted to [facility] with the diagnosis of cerebral edema. Lives with husband at [place]. Discussed Home Health and physical therapy in the home with Ms, Homer. Mr Ridings and another family member was present.  Discussed that insurance would pay for services in the home. Discussed that these services would not come everyday and would only do nursing and therapy. Unsure as to how often they would come to her home. Would like to think about all her options prior to making a decision.  Will check with Ms. Buchberger in the morning.                Action/Plan: Will continue to follow for discharge planning   Expected Discharge Date:                  Expected Discharge Plan:     In-House Referral:     Discharge planning Services     Post Acute Care Choice:    Choice offered to:     DME Arranged:    DME Agency:     HH Arranged:    HH Agency:     Status of Service:     If discussed at [employer] of Stay Meetings, dates discussed:    Additional Comments:  Shelbie Ammons, RN MSN CCM Care Management [phone] 05/15/2017, 4:32 PM

## 2017-05-15 NOTE — Progress Notes (Signed)
Inpatient Diabetes Program Recommendations  AACE/ADA: New Consensus Statement on Inpatient Glycemic Control (2015)  Target Ranges:  Prepandial:   less than 140 mg/dL      Peak postprandial:   less than 180 mg/dL (1-2 hours)      Critically ill patients:  140 - 180 mg/dL  Results for Rhonda Hurley, Rhonda Hurley (MRN [id number]) as of 05/15/2017 12:32  Ref. Range 05/14/2017 16:52 05/14/2017 21:58 05/15/2017 03:15 05/15/2017 07:42 05/15/2017 12:04  Glucose-Capillary Latest Ref Range: 65 - 99 mg/dL 188 (H) 291 (H) 231 (H) 224 (H) 304 (H)  Results for Rhonda Hurley, Rhonda Hurley (MRN [id number]) as of 05/15/2017 12:32  Ref. Range 05/14/2017 12:43 05/15/2017 03:20  Glucose Latest Ref Range: 65 - 99 mg/dL 146 (H) 229 (H)  Hemoglobin A1C Latest Ref Range: 4.8 - 5.6 %  6.1 (H)    Review of Glycemic Control  Diabetes history:DM2 Outpatient Diabetes medications: Metformin 1000 mg BID Current orders for Inpatient glycemic control: Novolog 0-9 units TID with meals, Novolog 0-5 units QHS  Inpatient Diabetes Program Recommendations: Insulin - Basal: If Decadron is continued, please consider ordering Lantus 10 units daily. Insulin - Meal Coverage: If Decadron is continued and patient is eating at least 50% of meals, please consider ordering Novolog 4 units TID with meals for meal coverage.  NOTE: Per chart review, patient is ordered Decadron due to new brain tumor. Decadron is likely cause of hyperglycemia. If appropriate and Decadron is continued, please consider ordering Lantus and Novolog meal coverage as noted above.  Thanks, Barnie Alderman, RN, MSN, CDE Diabetes Coordinator Inpatient Diabetes Program [phone] (Team Pager from 8am to 5pm)

## 2017-05-15 NOTE — Clinical Social Work Placement (Signed)
   CLINICAL SOCIAL WORK PLACEMENT  NOTE  Date:  05/15/2017  Patient Details  Name: Rhonda Hurley MRN: [id number] Date of Birth: 05/29/1927  Clinical Social Work is seeking post-discharge placement for this patient at the [place] level of care (*CSW will initial, date and re-position this form in  chart as items are completed):  Yes   Patient/family provided with [place] Work Department's list of facilities offering this level of care within the geographic area requested by the patient (or if unable, by the patient's family).  Yes   Patient/family informed of their freedom to choose among providers that offer the needed level of care, that participate in Medicare, Medicaid or managed care program needed by the patient, have an available bed and are willing to accept the patient.  Yes   Patient/family informed of [facility]'s ownership interest in [facility] and [facility], as well as of the fact that they are under no obligation to receive care at these facilities.  PASRR submitted to EDS on 05/15/17     PASRR number received on 05/15/17     Existing PASRR number confirmed on       FL2 transmitted to all facilities in geographic area requested by pt/family on 05/15/17     FL2 transmitted to all facilities within larger geographic area on       Patient informed that his/her managed care company has contracts with or will negotiate with certain facilities, including the following:        Yes   Patient/family informed of bed offers received.  Patient chooses bed at  [facility] )     Physician recommends and patient chooses bed at      Patient to be transferred to   on  .  Patient to be transferred to facility by       Patient family notified on   of transfer.  Name of family member notified:        PHYSICIAN       Additional Comment:    _______________________________________________ Axil Copeman, Veronia Beets,  LCSW 05/15/2017, 3:08 PM

## 2017-05-16 ENCOUNTER — Telehealth: Payer: Self-pay | Admitting: Internal Medicine

## 2017-05-16 DIAGNOSIS — Z79899 Other long term (current) drug therapy: Secondary | ICD-10-CM | POA: Diagnosis not present

## 2017-05-16 DIAGNOSIS — Z923 Personal history of irradiation: Secondary | ICD-10-CM | POA: Diagnosis not present

## 2017-05-16 DIAGNOSIS — C719 Malignant neoplasm of brain, unspecified: Secondary | ICD-10-CM | POA: Diagnosis not present

## 2017-05-16 DIAGNOSIS — E1142 Type 2 diabetes mellitus with diabetic polyneuropathy: Secondary | ICD-10-CM | POA: Diagnosis not present

## 2017-05-16 DIAGNOSIS — R2681 Unsteadiness on feet: Secondary | ICD-10-CM | POA: Diagnosis not present

## 2017-05-16 DIAGNOSIS — F39 Unspecified mood [affective] disorder: Secondary | ICD-10-CM | POA: Diagnosis not present

## 2017-05-16 DIAGNOSIS — I5032 Chronic diastolic (congestive) heart failure: Secondary | ICD-10-CM | POA: Diagnosis not present

## 2017-05-16 DIAGNOSIS — R278 Other lack of coordination: Secondary | ICD-10-CM | POA: Diagnosis not present

## 2017-05-16 DIAGNOSIS — E785 Hyperlipidemia, unspecified: Secondary | ICD-10-CM | POA: Diagnosis not present

## 2017-05-16 DIAGNOSIS — R269 Unspecified abnormalities of gait and mobility: Secondary | ICD-10-CM | POA: Diagnosis not present

## 2017-05-16 DIAGNOSIS — E039 Hypothyroidism, unspecified: Secondary | ICD-10-CM | POA: Diagnosis not present

## 2017-05-16 DIAGNOSIS — Z7901 Long term (current) use of anticoagulants: Secondary | ICD-10-CM | POA: Diagnosis not present

## 2017-05-16 DIAGNOSIS — R001 Bradycardia, unspecified: Secondary | ICD-10-CM | POA: Diagnosis not present

## 2017-05-16 DIAGNOSIS — Z853 Personal history of malignant neoplasm of breast: Secondary | ICD-10-CM | POA: Diagnosis not present

## 2017-05-16 DIAGNOSIS — Z7984 Long term (current) use of oral hypoglycemic drugs: Secondary | ICD-10-CM | POA: Diagnosis not present

## 2017-05-16 DIAGNOSIS — Z8673 Personal history of transient ischemic attack (TIA), and cerebral infarction without residual deficits: Secondary | ICD-10-CM | POA: Diagnosis not present

## 2017-05-16 DIAGNOSIS — I481 Persistent atrial fibrillation: Secondary | ICD-10-CM | POA: Diagnosis not present

## 2017-05-16 DIAGNOSIS — I4891 Unspecified atrial fibrillation: Secondary | ICD-10-CM | POA: Diagnosis not present

## 2017-05-16 DIAGNOSIS — I480 Paroxysmal atrial fibrillation: Secondary | ICD-10-CM | POA: Diagnosis not present

## 2017-05-16 DIAGNOSIS — I10 Essential (primary) hypertension: Secondary | ICD-10-CM | POA: Diagnosis not present

## 2017-05-16 DIAGNOSIS — C712 Malignant neoplasm of temporal lobe: Secondary | ICD-10-CM | POA: Diagnosis not present

## 2017-05-16 DIAGNOSIS — G936 Cerebral edema: Secondary | ICD-10-CM | POA: Diagnosis not present

## 2017-05-16 DIAGNOSIS — E119 Type 2 diabetes mellitus without complications: Secondary | ICD-10-CM | POA: Diagnosis not present

## 2017-05-16 DIAGNOSIS — I110 Hypertensive heart disease with heart failure: Secondary | ICD-10-CM | POA: Diagnosis not present

## 2017-05-16 LAB — GLUCOSE, CAPILLARY
Glucose-Capillary: 217 mg/dL — ABNORMAL HIGH (ref 65–99)
Glucose-Capillary: 345 mg/dL — ABNORMAL HIGH (ref 65–99)

## 2017-05-16 MED ORDER — ZOLPIDEM TARTRATE 5 MG PO TABS: 5.0000 mg | ORAL_TABLET | Freq: Every evening | ORAL | 0 refills | Status: AC | PRN

## 2017-05-16 MED ORDER — DEXAMETHASONE 4 MG PO TABS: 4.0000 mg | ORAL_TABLET | Freq: Three times a day (TID) | ORAL | Status: DC

## 2017-05-16 NOTE — Progress Notes (Signed)
No charge note:   Consult received:  Discussed with Dr. Darvin Neighbours an patient to be discharging today- he will refer for outpatient palliative f/u for [place] discussion.  Mariana Kaufman, AGNP-C Palliative Medicine  Please call Palliative Medicine team phone with any questions [phone]. For individual providers please see AMION.

## 2017-05-16 NOTE — Plan of Care (Signed)
Pt being d/ced to [facility].  Called report to [place] at [phone].  She's going to Rm 326 - her husband and daughter are transporting her.  Pt has had no c/o of pain today.  Now on decadron PO which is raising her blood sugar.  Darnelle Bos she may need to be put on sliding scale. Also communicated this to her daughter.   Pt seen by Neuro and Oncology and she's evaluating her tx options.  IV removed. Pt will go after lunch.

## 2017-05-16 NOTE — Progress Notes (Signed)
Referral for out patient palliative to follow received from attending physician Dr. Darvin Neighbours. Patient is discharging to [facility] for short term rehab and will be followed by Dr. Silvio Pate. Thank you. Flo Shanks RN, BSN, [facility] [place], hospital Liaison [phone] c

## 2017-05-16 NOTE — Clinical Social Work Placement (Signed)
   CLINICAL SOCIAL WORK PLACEMENT  NOTE  Date:  05/16/2017  Patient Details  Name: Rhonda Hurley MRN: [id number] Date of Birth: 13-May-1927  Clinical Social Work is seeking post-discharge placement for this patient at the [place] level of care (*CSW will initial, date and re-position this form in  chart as items are completed):  Yes   Patient/family provided with [place] Work Department's list of facilities offering this level of care within the geographic area requested by the patient (or if unable, by the patient's family).  Yes   Patient/family informed of their freedom to choose among providers that offer the needed level of care, that participate in Medicare, Medicaid or managed care program needed by the patient, have an available bed and are willing to accept the patient.  Yes   Patient/family informed of [facility]'s ownership interest in [facility] and [facility], as well as of the fact that they are under no obligation to receive care at these facilities.  PASRR submitted to EDS on 05/15/17     PASRR number received on 05/15/17     Existing PASRR number confirmed on       FL2 transmitted to all facilities in geographic area requested by pt/family on 05/15/17     FL2 transmitted to all facilities within larger geographic area on       Patient informed that his/her managed care company has contracts with or will negotiate with certain facilities, including the following:        Yes   Patient/family informed of bed offers received.  Patient chooses bed at  [facility] )     Physician recommends and patient chooses bed at      Patient to be transferred to  [facility] ) on 05/16/17.  Patient to be transferred to facility by  (Patient's daughter Lattie Haw will proivde transport. )     Patient family notified on 05/16/17 of transfer.  Name of family member notified:   (Patient's Lattie Haw is at bedside and aware of D/C  today. Patient's husband Delrae Alfred is also at bedside and aware of D/C today. )     PHYSICIAN       Additional Comment:    _______________________________________________ Faatima Tench, Veronia Beets, LCSW 05/16/2017, 11:49 AM

## 2017-05-16 NOTE — Plan of Care (Signed)
Problem: Education: Goal: Knowledge of [place] General Education information/materials will improve Outcome: Progressing VSS, free of falls during shift.  Denies pain.  Ambulated to bathroom w/ walker during shift, tolerated well.  No needs overnight.  Bed in low position, call bell within reach.  WCTM

## 2017-05-16 NOTE — Telephone Encounter (Signed)
Received call from [facility]  to [facility]/ follow up for 1 week (09/21) Per Dr Rogue Bussing, he will see patient on 05/19/17 @ 10:15 a.m, same day as Rad/Onc  appt.  s/w Robina Ade @ 1C/Onc/[facility]. (Dr B will see same day as Rad/Onc appt.) MF

## 2017-05-16 NOTE — Consult Note (Signed)
   [facility] CM Inpatient Consult   05/16/2017  Rhonda Hurley [birth year] [id number]   Patient screened for potential [place] Management services. Patient is on the [facility] registry as a benefit of their [employer] medicare . Electronic medical record reveals patient's discharge plan is to go to the Skilled Nursing division of [facility]. [facility] Care Management services not appropriate at this time. If patient's post hospital needs change please place a [facility] Care Management consult. For questions please contact:   Dhairya Corales RN, [facility] Liaison  [phone]) Business Mobile [phone]) Toll free office

## 2017-05-16 NOTE — Progress Notes (Signed)
Inpatient Diabetes Program Recommendations  AACE/ADA: New Consensus Statement on Inpatient Glycemic Control (2015)  Target Ranges:  Prepandial:   less than 140 mg/dL      Peak postprandial:   less than 180 mg/dL (1-2 hours)      Critically ill patients:  140 - 180 mg/dL   Results for ENCARNACION, BOLE (MRN [id number]) as of 05/16/2017 09:01  Ref. Range 05/15/2017 07:42 05/15/2017 12:04 05/15/2017 17:01 05/15/2017 21:53 05/16/2017 07:47  Glucose-Capillary Latest Ref Range: 65 - 99 mg/dL 224 (H) 304 (H) 215 (H) 232 (H) 217 (H)   Review of Glycemic Control Diabetes history:DM2 Outpatient Diabetes medications: Metformin 1000 mg BID Current orders for Inpatient glycemic control: Novolog 0-9 units TID with meals, Novolog 0-5 units QHS  Inpatient Diabetes Program Recommendations: Insulin - Basal: If Decadron is continued, please consider ordering Lantus 7 units daily. Insulin - Meal Coverage: If Decadron is continued and patient is eating at least 50% of meals, please consider ordering Novolog 4 units TID with meals for meal coverage.  NOTE: Per chart review, patient is ordered Decadron due to new brain tumor. Decadron is likely cause of hyperglycemia. If appropriate and Decadron is continued, please consider ordering Lantus and Novolog meal coverage as noted above.  Thanks, Barnie Alderman, RN, MSN, CDE Diabetes Coordinator Inpatient Diabetes Program

## 2017-05-16 NOTE — Progress Notes (Signed)
Patient is medically stable for D/C to [facility] today. [facility] authorization has been received, auth # [id number]. Per Seth Bake admissions coordinator at [facility] patient can come today to room 326. RN will call report at [phone]. Patient's daughter Lattie Haw will provide transport. Clinical Education officer, museum (CSW) sent D/C orders to [facility] via [place]. Patient is aware of above. Patient's daughter Lattie Haw and husband Delrae Alfred are at bedside and aware of above. Please reconsult if future social work needs arise. CSW signing off.   [employer], LCSW [phone]

## 2017-05-16 NOTE — Discharge Summary (Signed)
Rhonda Hurley NAME: Rhonda Hurley    MR#:  [id number]  DATE OF BIRTH:  20-Mar-1927  DATE OF ADMISSION:  05/14/2017 ADMITTING PHYSICIAN: Nicholes Mango, MD  DATE OF DISCHARGE: 05/16/2017  PRIMARY CARE PHYSICIAN: Juluis Pitch, MD   ADMISSION DIAGNOSIS:  Metastatic cancer to brain [facility]) [C79.31]  DISCHARGE DIAGNOSIS:  Active Problems:   Cerebral edema ([place])   SECONDARY DIAGNOSIS:   Past Medical History:  Diagnosis Date  . Acute cerebrovascular accident [facility])    a. 10/2012: MRI concern for posible small acute infarct along the left parietal cortex  . Cancer ([place])    breast  . Chronic diastolic CHF (congestive heart failure) ([place])    a. 03/2007 Echo: nl EF, mod MR,mild TR, Diast dysfxn; b. echo 10/2012: EF 50-55%, possible HK of anterior & anteroseptal wall, mild to mod LVH, nl RVSP.  Marland Kitchen Derangement of posterior horn of medial meniscus   . Essential hypertension 04/08/2014  . Junctional bradycardia    a. admitted 04/2017 w/ junctional bradycardia; b. rates improved w/ holding metoprolol, flecainide, & timolol; c. discharged on Cardizem CD 120 mg daily & Eliquis  . LBBB (left bundle branch block)   . Mitral valve disorders(424.0)    a. echo 03/2007: nl LVEF, mod MR, mild TR, DD; b. echo 10/2012: EF 50-55%, possible HK of anterior & anteroseptal wall, mild to mod LVH, nl RVSP; c. TTE 8/18: EF 60-65%, no RWMA, mild MR, mod dilated LA, RV sys fxn nl, PASP 36    . Other and unspecified hyperlipidemia   . Paroxysmal atrial fibrillation (HCC)    a. CHA2DS2VASc = 8-->eliquis;  b. Managed w/ flecainide and BB therapy.  Marland Kitchen TIA (transient ischemic attack) 2014  . Type II or unspecified type diabetes mellitus without mention of complication, not stated as uncontrolled   . Unspecified hypothyroidism      ADMITTING HISTORY  CHIEF COMPLAINT:  fall HISTORY OF PRESENT ILLNESS:  Rhonda Hurley  is a 83 y.o. female with a known history  ofdiabetes mellitus, hypertension, history of breast cancer status post chemotherapy 20 years ago currently cancer free, chronic atrial fibrillation on a eliquis is [facility] [place] [facility] after she sustained a fall. CT head with no intracranial bleed but has revealed he 2 low attenuation lesions with surrounding cerebral edema but no midline shift.Patient was given Decadron 10 mg [facility] hospitalist team is called to admit the patient. Patient denies any headache or chest pain or palpitations. No weight loss either. Resting comfortably during my examination   HOSPITAL COURSE:   Rhonda Hurley a 83 y.o. femalewith a known history ofdiabetes mellitus, hypertension, history of breast cancer status post chemotherapy 20 years ago currently cancer free, chronic atrial fibrillation on a eliquis is brought into the hospital [facility] [place] [facility] after she sustained a fall  # Glioblastoma with cerebral edema On decadron. Seen by Neurosurgery, radiation oncology and oncology Patient trying to decide between her options Discussed with Dr. Tish Men. F/U with him next week to further discuss options  #chronic atrial fibrillation Rate controlled continue home medication Cardizem CD and eliquis  #Hypothyroidism continue Synthroid  #Essential hypertension continue home medication altace and Cardizem  #diabetes mellitus non-insulin requiring SSI  Discharge to SNF today  CONSULTS OBTAINED:  Treatment Team:  Cammie Sickle, MD  DRUG ALLERGIES:  No Known Allergies  DISCHARGE MEDICATIONS:   Current Discharge Medication List    START  taking these medications   Details  dexamethasone (DECADRON) 4 MG tablet Take 1 tablet (4 mg total) by mouth 3 (three) times daily. Qty: 30 tablet, Refills: 0      CONTINUE these medications which have CHANGED   Details  zolpidem (AMBIEN) 5 MG tablet Take 1 tablet (5 mg total) by  mouth at bedtime as needed for sleep. Qty: 10 tablet, Refills: 0      CONTINUE these medications which have NOT CHANGED   Details  atorvastatin (LIPITOR) 10 MG tablet Take 1 tablet (10 mg total) by mouth daily. Qty: 30 tablet, Refills: 1    diltiazem (CARDIZEM CD) 120 MG 24 hr capsule Take 1 capsule (120 mg total) by mouth daily. Qty: 30 capsule, Refills: 1    ELIQUIS 5 MG TABS tablet TAKE 1 TABLET (5 MG TOTAL) BY MOUTH 2 (TWO) TIMES DAILY. Qty: 60 tablet, Refills: 3    escitalopram (LEXAPRO) 10 MG tablet Take 10 mg by mouth daily.     levothyroxine (SYNTHROID, LEVOTHROID) 112 MCG tablet Take 112 mcg by mouth every morning. Refills: 0    magnesium oxide (MAG-OX) 400 MG tablet Take 400 mg by mouth daily.     metFORMIN (GLUCOPHAGE) 500 MG tablet Take 1,000 mg by mouth 2 (two) times daily. Refills: 0    ramipril (ALTACE) 10 MG capsule TAKE 1 CAPSULE (10 MG TOTAL) BY MOUTH DAILY. Qty: 90 capsule, Refills: 3        Today   VITAL SIGNS:  Blood pressure (!) 147/60, pulse (!) 102, temperature 98.4 F (36.9 C), resp. rate 18, height 5\' 3"  (1.6 m), weight 69.9 kg (154 lb), SpO2 94 %.  I/O:   Intake/Output Summary (Last 24 hours) at 05/16/17 1030 Last data filed at 05/16/17 0943  Gross per 24 hour  Intake              600 ml  Output                0 ml  Net              600 ml    PHYSICAL EXAMINATION:  Physical Exam  GENERAL:  83 y.o.-year-old patient lying in the bed with no acute distress.  LUNGS: Normal breath sounds bilaterally, no wheezing, rales,rhonchi or crepitation. No use of accessory muscles of respiration.  CARDIOVASCULAR: S1, S2 normal. No murmurs, rubs, or gallops.  ABDOMEN: Soft, non-tender, non-distended. Bowel sounds present. No organomegaly or mass.  NEUROLOGIC: Moves all 4 extremities. PSYCHIATRIC: The patient is alert and oriented x 3.  SKIN: No obvious rash, lesion, or ulcer.   DATA REVIEW:   CBC  Recent Labs Lab 05/15/17 0320  WBC 10.0  HGB  11.4*  HCT 34.4*  PLT 375    Chemistries   Recent Labs Lab 05/15/17 0320  NA 136  K 3.5  CL 103  CO2 23  GLUCOSE 229*  BUN 18  CREATININE 0.61  CALCIUM 9.4  AST 27  ALT 14  ALKPHOS 77  BILITOT 0.8    Cardiac Enzymes No results for input(s): TROPONINI in the last 168 hours.  Microbiology Results  No results found for this or any previous visit.  RADIOLOGY:  Ct Chest W Contrast  Result Date: 05/14/2017 CLINICAL DATA:  Possible metastatic breast cancer. EXAM: CT CHEST WITH CONTRAST TECHNIQUE: Multidetector CT imaging of the chest was performed during intravenous contrast administration. CONTRAST:  43mL ISOVUE-300 IOPAMIDOL (ISOVUE-300) INJECTION 61% COMPARISON:  Radiographs of April 15, 2017. FINDINGS: Cardiovascular:  Atherosclerosis of thoracic aorta is noted without aneurysm or dissection. Coronary artery calcifications are noted. Normal cardiac size. No pericardial effusion is noted. Mediastinum/Nodes: No enlarged mediastinal, hilar, or axillary lymph nodes. Thyroid gland, trachea, and esophagus demonstrate no significant findings. Lungs/Pleura: Lungs are clear. No pleural effusion or pneumothorax. Upper Abdomen: No acute abnormality. Musculoskeletal: No chest wall abnormality. No acute or significant osseous findings. IMPRESSION: Coronary artery calcifications are noted suggesting coronary artery disease. No acute abnormality seen in the chest. Aortic Atherosclerosis (ICD10-I70.0). Electronically Signed   By: Marijo Conception, M.D.   On: 05/14/2017 14:30   Mr Jeri Cos And Wo Contrast  Result Date: 05/14/2017 CLINICAL DATA:  [place] in the bathroom.  Abnormal head CT. EXAM: MRI HEAD WITHOUT AND WITH CONTRAST TECHNIQUE: Multiplanar, multiecho pulse sequences of the brain and surrounding structures were obtained without and with intravenous contrast. CONTRAST:  67mL MULTIHANCE GADOBENATE DIMEGLUMINE 529 MG/ML IV SOLN COMPARISON:  CT same day.  MRI 11/14/2012. FINDINGS: Brain: There is  a 4-5 cm in diameter mass lesion in the posterior inferior cerebral hemisphere on the right at the junction of the posterior temporal lobe, occipital lobe and parietal lobe with internal necrosis and irregular margins consistent with primary brain malignancy/glioblastoma multiforme. The lesion has a broad surface along the region of the atrium of the right lateral ventricle and there is presumably ependymal or subependymal extension. There is regional mass effect secondary to extensive vasogenic edema. Some thickening and edema in the splenium of the corpus callosum could represent edema or evidence of crossing tumor. No enhancement in that region. No other satellite foci of enhancement. Asymmetric T2 signal in the right thalamus could be edema or tumor infiltration. Elsewhere, the brain shows extensive chronic small-vessel ischemic changes throughout the hemispheric white matter. No hydrocephalus. No ventricular trapping. No extra-axial fluid collection. Vascular: Major vessels at the base of the brain show flow. Skull and upper cervical spine: Negative Sinuses/Orbits: Clear/normal Other: None significant IMPRESSION: 4-5 cm malignancy in the posterior inferior hemisphere on the right at the junction of the posterior temporal lobe, occipital lobe and parietal lobe with extensive areas of central necrosis. Ependymal/subependymal contact of the atrium of the right lateral ventricle. Extensive regional vasogenic edema. Abnormal T2 signal in the right thalamus and splenium of the corpus callosum could represent edema or nonenhancing tumor. Diagnosis likely glioblastoma multiforme. Background pattern of extensive chronic small-vessel ischemic changes throughout the brain. Electronically Signed   By: Nelson Chimes M.D.   On: 05/14/2017 12:12    Follow up with PCP in 1 week.  Management plans discussed with the patient, family and they are in agreement.  CODE STATUS:     Code Status Orders        Start      Ordered   05/14/17 1146  Do not attempt resuscitation (DNR)  Continuous    Question Answer Comment  In the event of cardiac or respiratory ARREST Do not call a "code blue"   In the event of cardiac or respiratory ARREST Do not perform Intubation, CPR, defibrillation or ACLS   In the event of cardiac or respiratory ARREST Use medication by any route, position, wound care, and other measures to relive pain and suffering. May use oxygen, suction and manual treatment of airway obstruction as needed for comfort.   Comments RN may pronounce      05/14/17 1145    Code Status History    Date Active Date Inactive Code Status Order ID Comments User Context  04/15/2017  7:27 PM 04/17/2017  9:12 PM DNR [id number]  Theodoro Grist, MD Inpatient   04/15/2017  3:12 PM 04/15/2017  7:27 PM Full Code [id number]  Theodoro Grist, MD ED    Advance Directive Documentation     Most Recent Value  Type of Advance Directive  Healthcare Power of [place], Out of facility DNR (pink MOST or yellow form)  Pre-existing out of facility DNR order (yellow form or pink MOST form)  -  "MOST" Form in Place?  -      TOTAL TIME TAKING CARE OF THIS PATIENT ON DAY OF DISCHARGE: more than 30 minutes.   Hillary Bow R M.D on 05/16/2017 at 10:30 AM  Between 7am to 6pm - Pager - [phone]  After 6pm go to [email or web address] - password [email or web address] [place] [facility]  Office  [phone]  CC: Primary care physician; Juluis Pitch, MD  Note: This dictation was prepared with Dragon dictation along with smaller phrase technology. Any transcriptional errors that result from this process are unintentional.

## 2017-05-19 ENCOUNTER — Inpatient Hospital Stay: Payer: PPO | Admitting: Internal Medicine

## 2017-05-19 ENCOUNTER — Encounter: Payer: Self-pay | Admitting: Internal Medicine

## 2017-05-19 ENCOUNTER — Ambulatory Visit: Payer: PPO

## 2017-05-19 ENCOUNTER — Ambulatory Visit (INDEPENDENT_AMBULATORY_CARE_PROVIDER_SITE_OTHER): Payer: PPO | Admitting: Internal Medicine

## 2017-05-19 VITALS — BP 144/64 | HR 84 | Ht 63.0 in | Wt 161.0 lb

## 2017-05-19 DIAGNOSIS — R001 Bradycardia, unspecified: Secondary | ICD-10-CM | POA: Diagnosis not present

## 2017-05-19 DIAGNOSIS — C719 Malignant neoplasm of brain, unspecified: Secondary | ICD-10-CM | POA: Insufficient documentation

## 2017-05-19 NOTE — Patient Instructions (Signed)
Medication Instructions: - Your physician recommends that you continue on your current medications as directed. Please refer to the Current Medication list given to you today.  Labwork: - none ordered  Procedures/Testing: - none ordered  Follow-Up: - Dr. Caryl Comes will see you back as needed  Any Additional Special Instructions Will Be Listed Below (If Applicable).     If you need a refill on your cardiac medications before your next appointment, please call your pharmacy.

## 2017-05-19 NOTE — Progress Notes (Signed)
ELECTROPHYSIOLOGY CONSULT NOTE  Patient ID: Rhonda Hurley, MRN: [id number], DOB/AGE: 04-16-1927 83 y.o. Admit date: (Not on file) Date of Consult: 05/19/2017  Primary Physician: Juluis Pitch, MD Primary Cardiologist: Shirley Muscat is a 83 y.o. female who is being seen today for the evaluation of junctional bradycardia at the request of TG/RD.    HPI Rhonda Hurley is a 83 y.o. female referred following a presentation 8/18 with weakness wherein she was found to have junctional rhythm. She has a history of atrial fibrillation and at that time was on metoprolol, sotalol and flecainide all of which were discontinued. She was also started on diltiazem 120 mg a day. She ended up on a 48-hour monitor which demonstrated no significant bradycardia.  She has had no recent atrial fibrillation. She has a history of a stroke. Thromboembolic risk factors ( age  -2, HTN-1, TIA/CVA-2, Gender-1) for a CHADSVASc Score of 6 and she has been treated with ELIQUIS.  She has a remote history of breast cancer. More recently, 9/18, she was diagnosed with a presumed glioblastoma.  She is currently on steroids and is disinclined to pursue either neurosurgical or radiation therapy  She looks back at her life sees herself as extremely blessed and without regret     Past Medical History:  Diagnosis Date  . Acute cerebrovascular accident [facility])    a. 10/2012: MRI concern for posible small acute infarct along the left parietal cortex  . Cancer ([place])    breast  . Chronic diastolic CHF (congestive heart failure) ([place])    a. 03/2007 Echo: nl EF, mod MR,mild TR, Diast dysfxn; b. echo 10/2012: EF 50-55%, possible HK of anterior & anteroseptal wall, mild to mod LVH, nl RVSP.  Marland Kitchen Derangement of posterior horn of medial meniscus   . Essential hypertension 04/08/2014  . Glioblastoma multiforme of brain ([place])   . Junctional bradycardia    a. admitted 04/2017 w/ junctional bradycardia; b. rates  improved w/ holding metoprolol, flecainide, & timolol; c. discharged on Cardizem CD 120 mg daily & Eliquis  . LBBB (left bundle branch block)   . Mitral valve disorders(424.0)    a. echo 03/2007: nl LVEF, mod MR, mild TR, DD; b. echo 10/2012: EF 50-55%, possible HK of anterior & anteroseptal wall, mild to mod LVH, nl RVSP; c. TTE 8/18: EF 60-65%, no RWMA, mild MR, mod dilated LA, RV sys fxn nl, PASP 36    . Other and unspecified hyperlipidemia   . Paroxysmal atrial fibrillation ([facility])    a. CHA2DS2VASc = 8-->eliquis;  b. Managed w/ flecainide and BB therapy.  Marland Kitchen TIA (transient ischemic attack) 2014  . Type II or unspecified type diabetes mellitus without mention of complication, not stated as uncontrolled   . Unspecified hypothyroidism       Surgical History:  Past Surgical History:  Procedure Laterality Date  . BREAST LUMPECTOMY  12/1996   left   . CATARACT EXTRACTION  2000   left  . CATARACT EXTRACTION  11/2010   right  . ELECTROPHYSIOLOGIC STUDY N/A 06/13/2015   Procedure: Cardioversion;  Surgeon: Minna Merritts, MD;  Location: [facility] ORS;  Service: Cardiovascular;  Laterality: N/A;  . radioactive ablation   1999   thyroid glan  . SPLENECTOMY       Home Meds: Prior to Admission medications   Medication Sig Start Date End Date Taking? Authorizing Provider  atorvastatin (LIPITOR) 10 MG tablet Take 1 tablet (10 mg  total) by mouth daily. 04/17/17 04/17/18 Yes Max Sane, MD  dexamethasone (DECADRON) 4 MG tablet Take 1 tablet (4 mg total) by mouth 3 (three) times daily. 05/15/17  Yes Sudini, Alveta Heimlich, MD  diltiazem (CARDIZEM CD) 120 MG 24 hr capsule Take 1 capsule (120 mg total) by mouth daily. 04/17/17 04/17/18 Yes Shah, Vipul, MD  ELIQUIS 5 MG TABS tablet TAKE 1 TABLET (5 MG TOTAL) BY MOUTH 2 (TWO) TIMES DAILY. 01/28/17  Yes Gollan, Kathlene November, MD  escitalopram (LEXAPRO) 10 MG tablet Take 10 mg by mouth daily.  05/01/17  Yes [provider]  levothyroxine (SYNTHROID, LEVOTHROID) 112 MCG  tablet Take 112 mcg by mouth every morning. 01/07/17  Yes [provider]  magnesium oxide (MAG-OX) 400 MG tablet Take 400 mg by mouth daily.  05/01/17 05/01/18 Yes [provider]  metFORMIN (GLUCOPHAGE) 500 MG tablet Take 1,000 mg by mouth 2 (two) times daily. 07/11/15  Yes [provider]  ramipril (ALTACE) 10 MG capsule TAKE 1 CAPSULE (10 MG TOTAL) BY MOUTH DAILY. 12/09/16  Yes Gollan, Kathlene November, MD  zolpidem (AMBIEN) 5 MG tablet Take 1 tablet (5 mg total) by mouth at bedtime as needed for sleep. 05/16/17  Yes Hillary Bow, MD    Allergies: No Known Allergies  Social History   Social History  . Marital status: Married    Spouse name: N/A  . Number of children: N/A  . Years of education: N/A   Occupational History  . Not on file.   Social History Main Topics  . Smoking status: Never Smoker  . Smokeless tobacco: Never Used  . Alcohol use No  . Drug use: No  . Sexual activity: Not on file   Other Topics Concern  . Not on file   Social History Narrative  . No narrative on file     Family History  Problem Relation Age of Onset  . Bladder Cancer Neg Hx   . Kidney cancer Neg Hx      ROS:  Please see the history of present illness.     All other systems reviewed and negative.    Physical Exam: Blood pressure (!) 144/64, pulse 84, height 5\' 3"  (1.6 m), weight 161 lb (73 kg). General: Well developed, well nourished female in no acute distres sitting in wheel cahir Head: Normocephalic, atraumatic, sclera non-icteric, no xanthomas, nares are without discharge. EENT: normal  Lymph Nodes:  none Neck: Negative for carotid bruits. JVD not elevated. Back:with  Kyphosis  Lungs: Clear bilaterally to auscultation without wheezes, rales, or rhonchi. Breathing is unlabored. Heart: RRR with S1 S2.  2/6 systolic murmur . No rubs, or gallops appreciated. Abdomen: Soft, non-tender, non-distended with normoactive bowel sounds. No hepatomegaly. No rebound/guarding.  No obvious abdominal masses. Msk:  Strength and tone appear normal for age. Extremities: No clubbing or cyanosis. No edema.  Distal pedal pulses are 2+ and equal bilaterally. Skin: Warm and Dry Neuro: Alert and oriented X 3. CN III-XII intact Grossly normal sensory and motor function . Psych:  Responds to questions appropriately with a normal affect.      Labs: Cardiac Enzymes No results for input(s): CKTOTAL, CKMB, TROPONINI in the last 72 hours. CBC Lab Results  Component Value Date   WBC 10.0 05/15/2017   HGB 11.4 (L) 05/15/2017   HCT 34.4 (L) 05/15/2017   MCV 86.7 05/15/2017   PLT 375 05/15/2017   PROTIME: No results for input(s): LABPROT, INR in the last 72 hours. Chemistry  Recent Labs  Lab 05/15/17 0320  NA 136  K 3.5  CL 103  CO2 23  BUN 18  CREATININE 0.61  CALCIUM 9.4  PROT 6.7  BILITOT 0.8  ALKPHOS 77  ALT 14  AST 27  GLUCOSE 229*   Lipids No results found for: CHOL, HDL, LDLCALC, TRIG BNP No results found for: PROBNP Thyroid Function Tests: No results for input(s): TSH, T4TOTAL, T3FREE, THYROIDAB in the last 72 hours.  Invalid input(s): FREET3 Miscellaneous No results found for: DDIMER  Radiology/Studies:  Ct Head Wo Contrast  Result Date: 05/14/2017 CLINICAL DATA:  83 year old female with history of trauma after falling off of a toilet. Dizziness and head pain. EXAM: CT HEAD WITHOUT CONTRAST CT CERVICAL SPINE WITHOUT CONTRAST TECHNIQUE: Multidetector CT imaging of the head and cervical spine was performed following the standard protocol without intravenous contrast. Multiplanar CT image reconstructions of the cervical spine were also generated. COMPARISON:  Head CT 11/14/2012. FINDINGS: CT HEAD FINDINGS Brain: In the occipital region there is a relatively well-defined 1.9 x 1.2 x 1.5 cm low-attenuation lesion (axial image 16 of series 2 and coronal image 51 of series 4). Slightly caudal to this also in the occipital region there is a smaller  well-defined low-attenuation lesion measuring 1.0 x 1.3 x 0.9 cm (axial image 14 of series 2 and coronal image 46 of series 4). In the adjacent brain parenchyma throughout the right parietal, occipital and posterior temple regions there is extensive low attenuation, loss of gray-white junction, and effacement of the overlying sulci, compatible with underlying edema. No significant midline shift or evidence of uncal herniation noted at this time. Mild cerebral edema. Patchy and confluent areas of decreased attenuation are noted throughout the deep and periventricular white matter of the cerebral hemispheres bilaterally, compatible with chronic microvascular ischemic disease. No hydrocephalus. Vascular: No hyperdense vessel or unexpected calcification. Skull: Normal. Negative for fracture or focal lesion. Sinuses/Orbits: No acute finding. Other: None. CT CERVICAL SPINE FINDINGS Alignment: Exaggeration of normal cervical lordosis. Otherwise, normal. Skull base and vertebrae: No acute fracture. No primary bone lesion or focal pathologic process. Soft tissues and spinal canal: No prevertebral fluid or swelling. No visible canal hematoma. Disc levels: Multilevel degenerative disc disease, most severe at C5-C6. Moderate multilevel facet arthropathy. Upper chest: Visualized portions are unremarkable. Other: None. IMPRESSION: 1. There are 2 low-attenuation lesions in the right occipital lobe with extensive surrounding cerebral edema in adjacent portions of the right occipital, parietal and posterior temporal regions. Findings are highly concerning for metastatic disease to the brain, but could alternatively reflect the presence of small abscesses. Further evaluation with brain MRI with and without IV gadolinium is strongly recommended at this time. 2. No evidence of significant acute traumatic injury to the skull, brain or cervical spine. 3. Mild cerebral atrophy with extensive chronic microvascular ischemic changes in the  cerebral white matter. 4. Mild multilevel degenerative disc disease and cervical spondylosis, as above. These results were called by telephone at the time of interpretation on 05/14/2017 at 10:08 am to Dr. Rudene Re, who verbally acknowledged these results. Electronically Signed   By: Vinnie Langton M.D.   On: 05/14/2017 10:10   Ct Chest W Contrast  Result Date: 05/14/2017 CLINICAL DATA:  Possible metastatic breast cancer. EXAM: CT CHEST WITH CONTRAST TECHNIQUE: Multidetector CT imaging of the chest was performed during intravenous contrast administration. CONTRAST:  68mL ISOVUE-300 IOPAMIDOL (ISOVUE-300) INJECTION 61% COMPARISON:  Radiographs of April 15, 2017. FINDINGS: Cardiovascular: Atherosclerosis of thoracic aorta is noted without aneurysm or  dissection. Coronary artery calcifications are noted. Normal cardiac size. No pericardial effusion is noted. Mediastinum/Nodes: No enlarged mediastinal, hilar, or axillary lymph nodes. Thyroid gland, trachea, and esophagus demonstrate no significant findings. Lungs/Pleura: Lungs are clear. No pleural effusion or pneumothorax. Upper Abdomen: No acute abnormality. Musculoskeletal: No chest wall abnormality. No acute or significant osseous findings. IMPRESSION: Coronary artery calcifications are noted suggesting coronary artery disease. No acute abnormality seen in the chest. Aortic Atherosclerosis (ICD10-I70.0). Electronically Signed   By: Marijo Conception, M.D.   On: 05/14/2017 14:30   Ct Cervical Spine Wo Contrast  Result Date: 05/14/2017 CLINICAL DATA:  83 year old female with history of trauma after falling off of a toilet. Dizziness and head pain. EXAM: CT HEAD WITHOUT CONTRAST CT CERVICAL SPINE WITHOUT CONTRAST TECHNIQUE: Multidetector CT imaging of the head and cervical spine was performed following the standard protocol without intravenous contrast. Multiplanar CT image reconstructions of the cervical spine were also generated. COMPARISON:  Head CT  11/14/2012. FINDINGS: CT HEAD FINDINGS Brain: In the occipital region there is a relatively well-defined 1.9 x 1.2 x 1.5 cm low-attenuation lesion (axial image 16 of series 2 and coronal image 51 of series 4). Slightly caudal to this also in the occipital region there is a smaller well-defined low-attenuation lesion measuring 1.0 x 1.3 x 0.9 cm (axial image 14 of series 2 and coronal image 46 of series 4). In the adjacent brain parenchyma throughout the right parietal, occipital and posterior temple regions there is extensive low attenuation, loss of gray-white junction, and effacement of the overlying sulci, compatible with underlying edema. No significant midline shift or evidence of uncal herniation noted at this time. Mild cerebral edema. Patchy and confluent areas of decreased attenuation are noted throughout the deep and periventricular white matter of the cerebral hemispheres bilaterally, compatible with chronic microvascular ischemic disease. No hydrocephalus. Vascular: No hyperdense vessel or unexpected calcification. Skull: Normal. Negative for fracture or focal lesion. Sinuses/Orbits: No acute finding. Other: None. CT CERVICAL SPINE FINDINGS Alignment: Exaggeration of normal cervical lordosis. Otherwise, normal. Skull base and vertebrae: No acute fracture. No primary bone lesion or focal pathologic process. Soft tissues and spinal canal: No prevertebral fluid or swelling. No visible canal hematoma. Disc levels: Multilevel degenerative disc disease, most severe at C5-C6. Moderate multilevel facet arthropathy. Upper chest: Visualized portions are unremarkable. Other: None. IMPRESSION: 1. There are 2 low-attenuation lesions in the right occipital lobe with extensive surrounding cerebral edema in adjacent portions of the right occipital, parietal and posterior temporal regions. Findings are highly concerning for metastatic disease to the brain, but could alternatively reflect the presence of small abscesses.  Further evaluation with brain MRI with and without IV gadolinium is strongly recommended at this time. 2. No evidence of significant acute traumatic injury to the skull, brain or cervical spine. 3. Mild cerebral atrophy with extensive chronic microvascular ischemic changes in the cerebral white matter. 4. Mild multilevel degenerative disc disease and cervical spondylosis, as above. These results were called by telephone at the time of interpretation on 05/14/2017 at 10:08 am to Dr. Rudene Re, who verbally acknowledged these results. Electronically Signed   By: Vinnie Langton M.D.   On: 05/14/2017 10:10   Mr Brain W And Wo Contrast  Result Date: 05/14/2017 CLINICAL DATA:  [place] in the bathroom.  Abnormal head CT. EXAM: MRI HEAD WITHOUT AND WITH CONTRAST TECHNIQUE: Multiplanar, multiecho pulse sequences of the brain and surrounding structures were obtained without and with intravenous contrast. CONTRAST:  61mL MULTIHANCE GADOBENATE DIMEGLUMINE  529 MG/ML IV SOLN COMPARISON:  CT same day.  MRI 11/14/2012. FINDINGS: Brain: There is a 4-5 cm in diameter mass lesion in the posterior inferior cerebral hemisphere on the right at the junction of the posterior temporal lobe, occipital lobe and parietal lobe with internal necrosis and irregular margins consistent with primary brain malignancy/glioblastoma multiforme. The lesion has a broad surface along the region of the atrium of the right lateral ventricle and there is presumably ependymal or subependymal extension. There is regional mass effect secondary to extensive vasogenic edema. Some thickening and edema in the splenium of the corpus callosum could represent edema or evidence of crossing tumor. No enhancement in that region. No other satellite foci of enhancement. Asymmetric T2 signal in the right thalamus could be edema or tumor infiltration. Elsewhere, the brain shows extensive chronic small-vessel ischemic changes throughout the hemispheric white matter.  No hydrocephalus. No ventricular trapping. No extra-axial fluid collection. Vascular: Major vessels at the base of the brain show flow. Skull and upper cervical spine: Negative Sinuses/Orbits: Clear/normal Other: None significant IMPRESSION: 4-5 cm malignancy in the posterior inferior hemisphere on the right at the junction of the posterior temporal lobe, occipital lobe and parietal lobe with extensive areas of central necrosis. Ependymal/subependymal contact of the atrium of the right lateral ventricle. Extensive regional vasogenic edema. Abnormal T2 signal in the right thalamus and splenium of the corpus callosum could represent edema or nonenhancing tumor. Diagnosis likely glioblastoma multiforme. Background pattern of extensive chronic small-vessel ischemic changes throughout the brain. Electronically Signed   By: Nelson Chimes M.D.   On: 05/14/2017 12:12    EKG: Sinus rhythm at 84 Intervals 19/12/39 IVCD left bundle branchlike   Assessment and Plan:  Atrial fibrillation-paroxysmal  Junctional rhythm-previously while on flecainide, metoprolol/timolol  Brain tumor-recently diagnosed (9/18) thought to be glioblastoma multiform   Monitoring demonstrated no recurrent junctional rhythm. The weakness that was associated with its presentation has not recurred. In the context of her recent diagnosis of glioblastoma and her inclination not to pursue therapy, no further cardiac workup is indicated  I agree with the medication changes. In the event that her atrial fibrillation becomes symptomatic, flecainide used again amiodarone in the absence of diltiazem might help mitigate symptoms.  I worry about the ELIQUIS in the context of her brain tumors; I will defer this obviously to the oncology service  We will be glad to see her again as needed      Virl Axe

## 2017-05-20 ENCOUNTER — Ambulatory Visit: Payer: PPO | Admitting: Internal Medicine

## 2017-05-20 DIAGNOSIS — C719 Malignant neoplasm of brain, unspecified: Secondary | ICD-10-CM | POA: Diagnosis not present

## 2017-05-20 DIAGNOSIS — E1142 Type 2 diabetes mellitus with diabetic polyneuropathy: Secondary | ICD-10-CM | POA: Diagnosis not present

## 2017-05-20 DIAGNOSIS — F39 Unspecified mood [affective] disorder: Secondary | ICD-10-CM | POA: Diagnosis not present

## 2017-05-20 DIAGNOSIS — I481 Persistent atrial fibrillation: Secondary | ICD-10-CM | POA: Diagnosis not present

## 2017-05-22 ENCOUNTER — Inpatient Hospital Stay: Payer: PPO | Attending: Internal Medicine | Admitting: Internal Medicine

## 2017-05-22 VITALS — BP 143/69 | HR 93 | Temp 98.6°F | Resp 16

## 2017-05-22 DIAGNOSIS — I110 Hypertensive heart disease with heart failure: Secondary | ICD-10-CM

## 2017-05-22 DIAGNOSIS — Z7984 Long term (current) use of oral hypoglycemic drugs: Secondary | ICD-10-CM | POA: Insufficient documentation

## 2017-05-22 DIAGNOSIS — E785 Hyperlipidemia, unspecified: Secondary | ICD-10-CM

## 2017-05-22 DIAGNOSIS — I5032 Chronic diastolic (congestive) heart failure: Secondary | ICD-10-CM | POA: Diagnosis not present

## 2017-05-22 DIAGNOSIS — E119 Type 2 diabetes mellitus without complications: Secondary | ICD-10-CM | POA: Insufficient documentation

## 2017-05-22 DIAGNOSIS — Z923 Personal history of irradiation: Secondary | ICD-10-CM

## 2017-05-22 DIAGNOSIS — Z8673 Personal history of transient ischemic attack (TIA), and cerebral infarction without residual deficits: Secondary | ICD-10-CM

## 2017-05-22 DIAGNOSIS — Z7901 Long term (current) use of anticoagulants: Secondary | ICD-10-CM | POA: Diagnosis not present

## 2017-05-22 DIAGNOSIS — I480 Paroxysmal atrial fibrillation: Secondary | ICD-10-CM | POA: Insufficient documentation

## 2017-05-22 DIAGNOSIS — Z79899 Other long term (current) drug therapy: Secondary | ICD-10-CM | POA: Diagnosis not present

## 2017-05-22 DIAGNOSIS — Z853 Personal history of malignant neoplasm of breast: Secondary | ICD-10-CM | POA: Diagnosis not present

## 2017-05-22 DIAGNOSIS — E039 Hypothyroidism, unspecified: Secondary | ICD-10-CM | POA: Diagnosis not present

## 2017-05-22 DIAGNOSIS — I4891 Unspecified atrial fibrillation: Secondary | ICD-10-CM | POA: Diagnosis not present

## 2017-05-22 DIAGNOSIS — R001 Bradycardia, unspecified: Secondary | ICD-10-CM | POA: Insufficient documentation

## 2017-05-22 DIAGNOSIS — C712 Malignant neoplasm of temporal lobe: Secondary | ICD-10-CM

## 2017-05-22 DIAGNOSIS — C719 Malignant neoplasm of brain, unspecified: Secondary | ICD-10-CM

## 2017-05-22 NOTE — Progress Notes (Signed)
[name] NOTE  Patient Care Team: Juluis Pitch, MD as PCP - General (Family Medicine) [name], MD as Consulting Physician (Cardiology)  CHIEF COMPLAINTS/PURPOSE OF CONSULTATION:  GBM  #   No history exists.     HISTORY OF PRESENTING ILLNESS:  Rhonda Hurley 83 y.o.  female with remote history of breast cancer; and also history of A. fib on anticoagulation- had a recent fall was admitted to the hospital recently- and a CT scan of the brain showed a large lesion concerning for malignancy. MRI- on the brain was concerning for right occipital/parietal lobe- GBM.  Patient was evaluated by [facility]- patient declined any aggressive options. She was also evaluated by radiation oncology for radiation. Patient currently started on steroids.   Currently denies any headaches. She is currently nursing home/getting physical therapy.  Has chronic vision problems not any worse.    ROS: A complete 10 point review of system is done which is negative except mentioned above in history of present illness  MEDICAL HISTORY:  Past Medical History:  Diagnosis Date  . Acute cerebrovascular accident [facility])    a. 10/2012: MRI concern for posible small acute infarct along the left parietal cortex  . Cancer ([place])    breast  . Chronic diastolic CHF (congestive heart failure) ([place])    a. 03/2007 Echo: nl EF, mod MR,mild TR, Diast dysfxn; b. echo 10/2012: EF 50-55%, possible HK of anterior & anteroseptal wall, mild to mod LVH, nl RVSP.  Marland Kitchen Derangement of posterior horn of medial meniscus   . Essential hypertension 04/08/2014  . Glioblastoma multiforme of brain ([place])   . Junctional bradycardia    a. admitted 04/2017 w/ junctional bradycardia; b. rates improved w/ holding metoprolol, flecainide, & timolol; c. discharged on Cardizem CD 120 mg daily & Eliquis  . LBBB (left bundle branch block)   . Mitral valve disorders(424.0)    a. echo 03/2007: nl LVEF, mod MR, mild  TR, DD; b. echo 10/2012: EF 50-55%, possible HK of anterior & anteroseptal wall, mild to mod LVH, nl RVSP; c. TTE 8/18: EF 60-65%, no RWMA, mild MR, mod dilated LA, RV sys fxn nl, PASP 36    . Other and unspecified hyperlipidemia   . Paroxysmal atrial fibrillation (HCC)    a. CHA2DS2VASc = 8-->eliquis;  b. Managed w/ flecainide and BB therapy.  Marland Kitchen TIA (transient ischemic attack) 2014  . Type II or unspecified type diabetes mellitus without mention of complication, not stated as uncontrolled   . Unspecified hypothyroidism     SURGICAL HISTORY: Past Surgical History:  Procedure Laterality Date  . BREAST LUMPECTOMY  12/1996   left   . CATARACT EXTRACTION  2000   left  . CATARACT EXTRACTION  11/2010   right  . ELECTROPHYSIOLOGIC STUDY N/A 06/13/2015   Procedure: Cardioversion;  Surgeon: Minna Merritts, MD;  Location: [facility] ORS;  Service: Cardiovascular;  Laterality: N/A;  . radioactive ablation   1999   thyroid glan  . SPLENECTOMY      SOCIAL HISTORY: Social History   Social History  . Marital status: Married    Spouse name: N/A  . Number of children: N/A  . Years of education: N/A   Occupational History  . Not on file.   Social History Main Topics  . Smoking status: Never Smoker  . Smokeless tobacco: Never Used  . Alcohol use No  . Drug use: No  . Sexual activity: Not on file   Other Topics  Concern  . Not on file   Social History Narrative  . No narrative on file    FAMILY HISTORY: Family History  Problem Relation Age of Onset  . Bladder Cancer Neg Hx   . Kidney cancer Neg Hx     ALLERGIES:  has No Known Allergies.  MEDICATIONS:  Current Outpatient Prescriptions  Medication Sig Dispense Refill  . acetaminophen (TYLENOL) 650 MG CR tablet Take 650 mg by mouth every 4 (four) hours as needed for pain.    Marland Kitchen atorvastatin (LIPITOR) 10 MG tablet Take 1 tablet (10 mg total) by mouth daily. 30 tablet 1  . dexamethasone (DECADRON) 4 MG tablet Take 1 tablet (4 mg total)  by mouth 3 (three) times daily. 30 tablet 0  . diltiazem (CARDIZEM CD) 120 MG 24 hr capsule Take 1 capsule (120 mg total) by mouth daily. 30 capsule 1  . ELIQUIS 5 MG TABS tablet TAKE 1 TABLET (5 MG TOTAL) BY MOUTH 2 (TWO) TIMES DAILY. 60 tablet 3  . escitalopram (LEXAPRO) 10 MG tablet Take 10 mg by mouth daily.     Marland Kitchen levothyroxine (SYNTHROID, LEVOTHROID) 112 MCG tablet Take 112 mcg by mouth every morning.  0  . magnesium oxide (MAG-OX) 400 MG tablet Take 400 mg by mouth daily.     . metFORMIN (GLUCOPHAGE) 1000 MG tablet Take 1,000 mg by mouth daily.   0  . ramipril (ALTACE) 10 MG capsule TAKE 1 CAPSULE (10 MG TOTAL) BY MOUTH DAILY. 90 capsule 3  . zolpidem (AMBIEN) 5 MG tablet Take 1 tablet (5 mg total) by mouth at bedtime as needed for sleep. 10 tablet 0   No current facility-administered medications for this visit.       Marland Kitchen  PHYSICAL EXAMINATION: ECOG PERFORMANCE STATUS: 3 - Symptomatic, >50% confined to bed  Vitals:   05/22/17 1037  BP: (!) 143/69  Pulse: 93  Resp: 16  Temp: 98.6 F (37 C)   There were no vitals filed for this visit.  GENERAL: Well-nourished well-developed; Alert, no distress and comfortable.   Accompanied by family-husband/daughter EYES: no pallor or icterus OROPHARYNX: no thrush or ulceration. NECK: supple, no masses felt LYMPH:  no palpable lymphadenopathy in the cervical, axillary or inguinal regions LUNGS: decreased breath sounds to auscultation at bases and  No wheeze or crackles HEART/CVS: regular rate & rhythm and no murmurs; No lower extremity edema ABDOMEN: abdomen soft, non-tender and normal bowel sounds Musculoskeletal:no cyanosis of digits and no clubbing  PSYCH: alert & oriented x 3 with fluent speech NEURO: no focal motor/sensory deficits SKIN:  no rashes or significant lesions  LABORATORY DATA:  I have reviewed the data as listed Lab Results  Component Value Date   WBC 10.0 05/15/2017   HGB 11.4 (L) 05/15/2017   HCT 34.4 (L)  05/15/2017   MCV 86.7 05/15/2017   PLT 375 05/15/2017    Recent Labs  04/05/17 0058  05/14/17 1048 05/14/17 1243 05/15/17 0320  NA 140  < > 138 137 136  K 3.5  < > 3.7 3.7 3.5  CL 108  < > 104 103 103  CO2 23  < > 26 25 23   GLUCOSE 111*  < > 124* 146* 229*  BUN 17  < > 16 15 18   CREATININE 0.68  < > 0.60 0.62 0.61  CALCIUM 9.1  < > 9.5 9.6 9.4  GFRNONAA >60  < > >60 >60 >60  GFRAA >60  < > >60 >60 >60  PROT 6.6  --  6.3*  --  6.7  ALBUMIN 3.5  --  3.3*  --  3.4*  AST 22  --  19  --  27  ALT 12*  --  11*  --  14  ALKPHOS 80  --  74  --  77  BILITOT 0.6  --  1.1  --  0.8  BILIDIR <0.1*  --   --   --   --   IBILI NOT CALCULATED  --   --   --   --   < > = values in this interval not displayed.  RADIOGRAPHIC STUDIES: I have personally reviewed the radiological images as listed and agreed with the findings in the report. Ct Head Wo Contrast  Result Date: 05/14/2017 CLINICAL DATA:  83 year old female with history of trauma after falling off of a toilet. Dizziness and head pain. EXAM: CT HEAD WITHOUT CONTRAST CT CERVICAL SPINE WITHOUT CONTRAST TECHNIQUE: Multidetector CT imaging of the head and cervical spine was performed following the standard protocol without intravenous contrast. Multiplanar CT image reconstructions of the cervical spine were also generated. COMPARISON:  Head CT 11/14/2012. FINDINGS: CT HEAD FINDINGS Brain: In the occipital region there is a relatively well-defined 1.9 x 1.2 x 1.5 cm low-attenuation lesion (axial image 16 of series 2 and coronal image 51 of series 4). Slightly caudal to this also in the occipital region there is a smaller well-defined low-attenuation lesion measuring 1.0 x 1.3 x 0.9 cm (axial image 14 of series 2 and coronal image 46 of series 4). In the adjacent brain parenchyma throughout the right parietal, occipital and posterior temple regions there is extensive low attenuation, loss of gray-white junction, and effacement of the overlying sulci,  compatible with underlying edema. No significant midline shift or evidence of uncal herniation noted at this time. Mild cerebral edema. Patchy and confluent areas of decreased attenuation are noted throughout the deep and periventricular white matter of the cerebral hemispheres bilaterally, compatible with chronic microvascular ischemic disease. No hydrocephalus. Vascular: No hyperdense vessel or unexpected calcification. Skull: Normal. Negative for fracture or focal lesion. Sinuses/Orbits: No acute finding. Other: None. CT CERVICAL SPINE FINDINGS Alignment: Exaggeration of normal cervical lordosis. Otherwise, normal. Skull base and vertebrae: No acute fracture. No primary bone lesion or focal pathologic process. Soft tissues and spinal canal: No prevertebral fluid or swelling. No visible canal hematoma. Disc levels: Multilevel degenerative disc disease, most severe at C5-C6. Moderate multilevel facet arthropathy. Upper chest: Visualized portions are unremarkable. Other: None. IMPRESSION: 1. There are 2 low-attenuation lesions in the right occipital lobe with extensive surrounding cerebral edema in adjacent portions of the right occipital, parietal and posterior temporal regions. Findings are highly concerning for metastatic disease to the brain, but could alternatively reflect the presence of small abscesses. Further evaluation with brain MRI with and without IV gadolinium is strongly recommended at this time. 2. No evidence of significant acute traumatic injury to the skull, brain or cervical spine. 3. Mild cerebral atrophy with extensive chronic microvascular ischemic changes in the cerebral white matter. 4. Mild multilevel degenerative disc disease and cervical spondylosis, as above. These results were called by telephone at the time of interpretation on 05/14/2017 at 10:08 am to Dr. Rudene Re, who verbally acknowledged these results. Electronically Signed   By: Vinnie Langton M.D.   On: 05/14/2017  10:10   Ct Chest W Contrast  Result Date: 05/14/2017 CLINICAL DATA:  Possible metastatic breast cancer. EXAM: CT CHEST WITH CONTRAST TECHNIQUE: Multidetector CT imaging of the  chest was performed during intravenous contrast administration. CONTRAST:  63mL ISOVUE-300 IOPAMIDOL (ISOVUE-300) INJECTION 61% COMPARISON:  Radiographs of April 15, 2017. FINDINGS: Cardiovascular: Atherosclerosis of thoracic aorta is noted without aneurysm or dissection. Coronary artery calcifications are noted. Normal cardiac size. No pericardial effusion is noted. Mediastinum/Nodes: No enlarged mediastinal, hilar, or axillary lymph nodes. Thyroid gland, trachea, and esophagus demonstrate no significant findings. Lungs/Pleura: Lungs are clear. No pleural effusion or pneumothorax. Upper Abdomen: No acute abnormality. Musculoskeletal: No chest wall abnormality. No acute or significant osseous findings. IMPRESSION: Coronary artery calcifications are noted suggesting coronary artery disease. No acute abnormality seen in the chest. Aortic Atherosclerosis (ICD10-I70.0). Electronically Signed   By: Marijo Conception, M.D.   On: 05/14/2017 14:30   Ct Cervical Spine Wo Contrast  Result Date: 05/14/2017 CLINICAL DATA:  83 year old female with history of trauma after falling off of a toilet. Dizziness and head pain. EXAM: CT HEAD WITHOUT CONTRAST CT CERVICAL SPINE WITHOUT CONTRAST TECHNIQUE: Multidetector CT imaging of the head and cervical spine was performed following the standard protocol without intravenous contrast. Multiplanar CT image reconstructions of the cervical spine were also generated. COMPARISON:  Head CT 11/14/2012. FINDINGS: CT HEAD FINDINGS Brain: In the occipital region there is a relatively well-defined 1.9 x 1.2 x 1.5 cm low-attenuation lesion (axial image 16 of series 2 and coronal image 51 of series 4). Slightly caudal to this also in the occipital region there is a smaller well-defined low-attenuation lesion measuring 1.0  x 1.3 x 0.9 cm (axial image 14 of series 2 and coronal image 46 of series 4). In the adjacent brain parenchyma throughout the right parietal, occipital and posterior temple regions there is extensive low attenuation, loss of gray-white junction, and effacement of the overlying sulci, compatible with underlying edema. No significant midline shift or evidence of uncal herniation noted at this time. Mild cerebral edema. Patchy and confluent areas of decreased attenuation are noted throughout the deep and periventricular white matter of the cerebral hemispheres bilaterally, compatible with chronic microvascular ischemic disease. No hydrocephalus. Vascular: No hyperdense vessel or unexpected calcification. Skull: Normal. Negative for fracture or focal lesion. Sinuses/Orbits: No acute finding. Other: None. CT CERVICAL SPINE FINDINGS Alignment: Exaggeration of normal cervical lordosis. Otherwise, normal. Skull base and vertebrae: No acute fracture. No primary bone lesion or focal pathologic process. Soft tissues and spinal canal: No prevertebral fluid or swelling. No visible canal hematoma. Disc levels: Multilevel degenerative disc disease, most severe at C5-C6. Moderate multilevel facet arthropathy. Upper chest: Visualized portions are unremarkable. Other: None. IMPRESSION: 1. There are 2 low-attenuation lesions in the right occipital lobe with extensive surrounding cerebral edema in adjacent portions of the right occipital, parietal and posterior temporal regions. Findings are highly concerning for metastatic disease to the brain, but could alternatively reflect the presence of small abscesses. Further evaluation with brain MRI with and without IV gadolinium is strongly recommended at this time. 2. No evidence of significant acute traumatic injury to the skull, brain or cervical spine. 3. Mild cerebral atrophy with extensive chronic microvascular ischemic changes in the cerebral white matter. 4. Mild multilevel  degenerative disc disease and cervical spondylosis, as above. These results were called by telephone at the time of interpretation on 05/14/2017 at 10:08 am to Dr. Rudene Re, who verbally acknowledged these results. Electronically Signed   By: Vinnie Langton M.D.   On: 05/14/2017 10:10   Mr Brain W And Wo Contrast  Result Date: 05/14/2017 CLINICAL DATA:  [place] in the bathroom.  Abnormal  head CT. EXAM: MRI HEAD WITHOUT AND WITH CONTRAST TECHNIQUE: Multiplanar, multiecho pulse sequences of the brain and surrounding structures were obtained without and with intravenous contrast. CONTRAST:  74mL MULTIHANCE GADOBENATE DIMEGLUMINE 529 MG/ML IV SOLN COMPARISON:  CT same day.  MRI 11/14/2012. FINDINGS: Brain: There is a 4-5 cm in diameter mass lesion in the posterior inferior cerebral hemisphere on the right at the junction of the posterior temporal lobe, occipital lobe and parietal lobe with internal necrosis and irregular margins consistent with primary brain malignancy/glioblastoma multiforme. The lesion has a broad surface along the region of the atrium of the right lateral ventricle and there is presumably ependymal or subependymal extension. There is regional mass effect secondary to extensive vasogenic edema. Some thickening and edema in the splenium of the corpus callosum could represent edema or evidence of crossing tumor. No enhancement in that region. No other satellite foci of enhancement. Asymmetric T2 signal in the right thalamus could be edema or tumor infiltration. Elsewhere, the brain shows extensive chronic small-vessel ischemic changes throughout the hemispheric white matter. No hydrocephalus. No ventricular trapping. No extra-axial fluid collection. Vascular: Major vessels at the base of the brain show flow. Skull and upper cervical spine: Negative Sinuses/Orbits: Clear/normal Other: None significant IMPRESSION: 4-5 cm malignancy in the posterior inferior hemisphere on the right at the  junction of the posterior temporal lobe, occipital lobe and parietal lobe with extensive areas of central necrosis. Ependymal/subependymal contact of the atrium of the right lateral ventricle. Extensive regional vasogenic edema. Abnormal T2 signal in the right thalamus and splenium of the corpus callosum could represent edema or nonenhancing tumor. Diagnosis likely glioblastoma multiforme. Background pattern of extensive chronic small-vessel ischemic changes throughout the brain. Electronically Signed   By: Nelson Chimes M.D.   On: 05/14/2017 12:12    ASSESSMENT & PLAN:   GBM (glioblastoma multiforme) ([place]) # Glioblastoma multiforme- based on imaging. Patient declined biopsy.   # I had a long discussion with the patient/family regarding the serious diagnosis of GBM. At best with aggressive care-which includes surgery followed by chemoradiation; the median survival is approximately 12 months. In her situation; given her age intact tumor- her survival is at best few months.   # I discussed the less aggressive treatment options of Temodar single agent versus radiation alone. Patient declines any of the treatment options as she feels that she cannot be cured of her malignancy. She is interested in hospice- when her condition declines. She is currently at the nursing home.  # Recommend continued steroids for now symptom control. I also discussed the potential signs and symptoms of worsening disease; and symptom management.   # I reviewed the blood work- with the patient in detail; also reviewed the imaging independently [as summarized above]; and with the patient in detail. Also discussed with Dr. Donella Stade.  # 40 minutes face-to-face with the patient discussing the above plan of care; more than 50% of time spent on prognosis/ natural history; counseling and coordination.   All questions were answered. The patient knows to call the clinic with any problems, questions or concerns.     Cammie Sickle, MD 05/30/2017 7:53 PM

## 2017-05-26 ENCOUNTER — Telehealth: Payer: Self-pay

## 2017-05-26 NOTE — Telephone Encounter (Signed)
PLEASE NOTE: All timestamps contained within this report are represented as [place] Standard Time. CONFIDENTIALTY NOTICE: This fax transmission is intended only for the addressee. It contains information that is legally privileged, confidential or otherwise protected from use or disclosure. If you are not the intended recipient, you are strictly prohibited from reviewing, disclosing, copying using or disseminating any of this information or taking any action in reliance on or regarding this information. If you have received this fax in error, please notify us immediately by telephone so that we can arrange for its return to [place]. Phone: [phone], Toll-Free: [phone], Fax: [phone] Page: 1 of 1 Call Id: [id number] [place] Night - Client Nonclinical Telephone Record [place] Night - Client Client Site [place] Physician Viviana Simpler - MD Contact Type Call Who Is Calling Physician / Provider / Hospital Call Type Provider Call [facility] Page Now Reason for Call Request for Physician Consult Initial Comment Caller RN needs to have a consult with on call. Additional Comment Caller states her blood sugar is high. Patient Name Tansy Lorek Patient DOB 1926-11-14 Requesting Provider Jaymes Graff RN Physician Number [place] Name [facility] Paging DoctorName Phone DateTime Result/Outcome Message Type Notes Philemon Kingdom- MD [phone] 05/25/2017 1:50:40 PM Called On Call Provider - Reached Doctor Paged Philemon Kingdom- MD 05/25/2017 1:50:48 PM Spoke with On Call - General Message Result Call Closed By: Philis Kendall Transaction Date/Time: 05/25/2017 1:34:38 PM (ET)

## 2017-05-27 DIAGNOSIS — E119 Type 2 diabetes mellitus without complications: Secondary | ICD-10-CM | POA: Diagnosis not present

## 2017-05-30 ENCOUNTER — Inpatient Hospital Stay: Payer: PPO | Admitting: Internal Medicine

## 2017-05-30 NOTE — Assessment & Plan Note (Signed)
#   Glioblastoma multiforme- based on imaging. Patient declined biopsy.   # I had a long discussion with the patient/family regarding the serious diagnosis of GBM. At best with aggressive care-which includes surgery followed by chemoradiation; the median survival is approximately 12 months. In her situation; given her age intact tumor- her survival is at best few months.   # I discussed the less aggressive treatment options of Temodar single agent versus radiation alone. Patient declines any of the treatment options as she feels that she cannot be cured of her malignancy. She is interested in hospice- when her condition declines. She is currently at the nursing home.  # Recommend continued steroids for now symptom control. I also discussed the potential signs and symptoms of worsening disease; and symptom management.   # I reviewed the blood work- with the patient in detail; also reviewed the imaging independently [as summarized above]; and with the patient in detail. Also discussed with Dr. Donella Stade.  # 40 minutes face-to-face with the patient discussing the above plan of care; more than 50% of time spent on prognosis/ natural history; counseling and coordination.

## 2017-06-01 ENCOUNTER — Telehealth: Payer: Self-pay | Admitting: Family Medicine

## 2017-06-01 NOTE — Telephone Encounter (Signed)
Ms Rhonda Hurley from SNF is calling to report that Ms Rhonda Hurley has a 3 Lb wt gain since yesterday. States that she is supposed to called the on call provider if there is a wt gain of  > 2 Lb.  Caller also noted that Ms Rhonda Hurley's wt has fluctuated up and down for the past few days. She has Hx of CHF, DM II, and HTN. She has not had dyspnea,edema,orthopnea,or PND. No changes in urine output. No PRN orders for Furosemide.   According to caller , patient has Hx of "terminal" brain tumor and has some intermittent MS changes. She is comfortable and has no complains at this time.  Plan:  I am not sure about what is expected from on call provider in regard to wt fluctuations. She is comfortable, no respiratory distress or edema, so no further recommendations at this time.  Serah Nicoletti Martinique, MD

## 2017-06-02 NOTE — Telephone Encounter (Signed)
PLEASE NOTE: All timestamps contained within this report are represented as [place] Standard Time. CONFIDENTIALTY NOTICE: This fax transmission is intended only for the addressee. It contains information that is legally privileged, confidential or otherwise protected from use or disclosure. If you are not the intended recipient, you are strictly prohibited from reviewing, disclosing, copying using or disseminating any of this information or taking any action in reliance on or regarding this information. If you have received this fax in error, please notify us immediately by telephone so that we can arrange for its return to [place]. Phone: [phone], Toll-Free: [phone], Fax: [phone] Page: 1 of 1 Call Id: [id number] [place] Night - Client Nonclinical Telephone Record [place] Night - Client Client Site [place] Physician Viviana Simpler - MD Contact Type Call Call [place] Page Now Who Is [employer] [facility] [place] Name [facility] Name [place] Number [phone] Patient Name Rhonda Hurley Patient DOB 27-Apr-1927 Reason for Call Request to speak to Physician Initial Comment Caller stated she needed to speak with the on call. Additional Comment Paging DoctorName Phone DateTime Result/Outcome Message Type Notes Martinique, Betty - MD [phone] 06/01/2017 7:29:42 PM Called On Call Provider - Reached Doctor Paged Martinique, Betty - MD 06/01/2017 7:30:05 PM Spoke with On Call - General Message Result Provided Dr. Martinique with info and transferred her to caller. Call Closed By: Noelle Penner Transaction Date/Time: 06/01/2017 7:13:49 PM (ET)

## 2017-06-02 NOTE — Telephone Encounter (Signed)
Reviewed concerns 3# weight gain No symptoms though Will follow at [facility]

## 2017-06-09 ENCOUNTER — Telehealth: Payer: Self-pay

## 2017-06-09 DIAGNOSIS — F39 Unspecified mood [affective] disorder: Secondary | ICD-10-CM | POA: Diagnosis not present

## 2017-06-09 DIAGNOSIS — I482 Chronic atrial fibrillation: Secondary | ICD-10-CM | POA: Diagnosis not present

## 2017-06-09 DIAGNOSIS — C710 Malignant neoplasm of cerebrum, except lobes and ventricles: Secondary | ICD-10-CM | POA: Diagnosis not present

## 2017-06-09 DIAGNOSIS — E1142 Type 2 diabetes mellitus with diabetic polyneuropathy: Secondary | ICD-10-CM | POA: Diagnosis not present

## 2017-06-09 NOTE — Telephone Encounter (Signed)
Seen today and discussed with daughter who was there No acute issues (has glioblastoma and hospice coming in today)

## 2017-06-09 NOTE — Telephone Encounter (Signed)
PLEASE NOTE: All timestamps contained within this report are represented as [place] Standard Time. CONFIDENTIALTY NOTICE: This fax transmission is intended only for the addressee. It contains information that is legally privileged, confidential or otherwise protected from use or disclosure. If you are not the intended recipient, you are strictly prohibited from reviewing, disclosing, copying using or disseminating any of this information or taking any action in reliance on or regarding this information. If you have received this fax in error, please notify us immediately by telephone so that we can arrange for its return to [place]. Phone: [phone], Toll-Free: [phone], Fax: [phone] Page: 1 of 1 Call Id: [id number] [employer] Night - Client Nonclinical Telephone Record [place] Night - Client Client Site [place] Physician Viviana Simpler - MD Contact Type Call Who Is Calling Physician / Provider / Hospital Call Type Provider Call [facility] Page Now Reason for Call Request for Physician Consult Initial Comment stephanie w/ [facility], pt. gained 2 pounds overnight Additional Comment Patient Name Rhonda Hurley Patient DOB 14-Mar-1927 Requesting Provider stephanie Physician Number [phone] Facility Name [facility] Paging DoctorName Phone DateTime Result/Outcome Message Type Notes Arnette Norris - MD [phone] 06/07/2017 10:38:13 AM Paged On Call Back to Call Center Doctor Paged Please call Amy @ the call center, [phone]. Thanks! Arnette Norris - MD 06/07/2017 10:41:22 AM Spoke with On Call - General Message Result Call Closed By: Philis Kendall Transaction Date/Time: 06/07/2017 10:25:33 AM (ET)

## 2017-06-19 DIAGNOSIS — I5032 Chronic diastolic (congestive) heart failure: Secondary | ICD-10-CM | POA: Diagnosis not present

## 2017-06-19 DIAGNOSIS — I110 Hypertensive heart disease with heart failure: Secondary | ICD-10-CM | POA: Diagnosis not present

## 2017-06-19 DIAGNOSIS — E119 Type 2 diabetes mellitus without complications: Secondary | ICD-10-CM | POA: Diagnosis not present

## 2017-06-19 DIAGNOSIS — C719 Malignant neoplasm of brain, unspecified: Secondary | ICD-10-CM | POA: Diagnosis not present

## 2017-07-04 ENCOUNTER — Telehealth: Payer: Self-pay

## 2017-07-04 NOTE — Telephone Encounter (Signed)
This was expected and she was very ready!

## 2017-07-04 NOTE — Telephone Encounter (Signed)
PLEASE NOTE: All timestamps contained within this report are represented as [place] Standard Time. CONFIDENTIALTY NOTICE: This fax transmission is intended only for the addressee. It contains information that is legally privileged, confidential or otherwise protected from use or disclosure. If you are not the intended recipient, you are strictly prohibited from reviewing, disclosing, copying using or disseminating any of this information or taking any action in reliance on or regarding this information. If you have received this fax in error, please notify us immediately by telephone so that we can arrange for its return to [place]. Phone: [phone], Toll-Free: [phone], Fax: [phone] Page: 1 of 1 Call Id: [id number] [place] Patient Name: Rhonda Hurley Gender: Unknown DOB: December 04, 1926 Age: 83 Y 83 M 13 D Return Phone Number: Address: City/State/Zip: [place] Client [place] Night - Client Client Site [place] Physician Viviana Simpler - MD Contact Type Call Who Is Calling Patient / Member / Family / Caregiver Call Type Triage / Clinical Caller Name Lelan Pons Relationship To Patient Provider Return Phone Number Please choose phone number Chief Complaint DEATH - has occurred or is imminent or pending Reason for Call Symptomatic / Request for Health Information Initial Comment Caller is Lelan Pons from [facility] . Dr Silvio Pate. Pt expired. CB [phone] Translation No Nurse Assessment Nurse: Yolanda Bonine. RN, Sunday Spillers Date/Time Eilene Ghazi Time): Aug 02, 2017 11:59:07 PM Confirm and document reason for call. If symptomatic, describe symptoms. ---Caller states that patient was on hospice and wanted to make the office aware of passing. Does the patient have any new or worsening symptoms? ---No Please document clinical information provided and list  any resource used. ---will make office aware Guidelines Guideline Title Affirmed Question Affirmed Notes Nurse Date/Time (Eastern Time) Disp. Time Eilene Ghazi Time) Disposition Final User August 02, 2017 11:57:03 PM Send to Urgent Janetta Hora Aug 02, 2017 11:57:10 PM Send to Urgent Janetta Hora 07/04/2017 12:01:32 AM Clinical Call Yes Yolanda Bonine. RN, Sunday Spillers

## 2017-08-02 DEATH — deceased

## 2017-08-04 ENCOUNTER — Ambulatory Visit: Payer: PPO | Admitting: Cardiovascular Disease

## 2018-05-18 IMAGING — CR DG CHEST 2V
2 series · 2 of 2 positions shown · non-contrast
Comparison: 04/04/2017

CLINICAL DATA: Weakness and chest tightness

EXAM:
CHEST  2 VIEW

[chest lat]
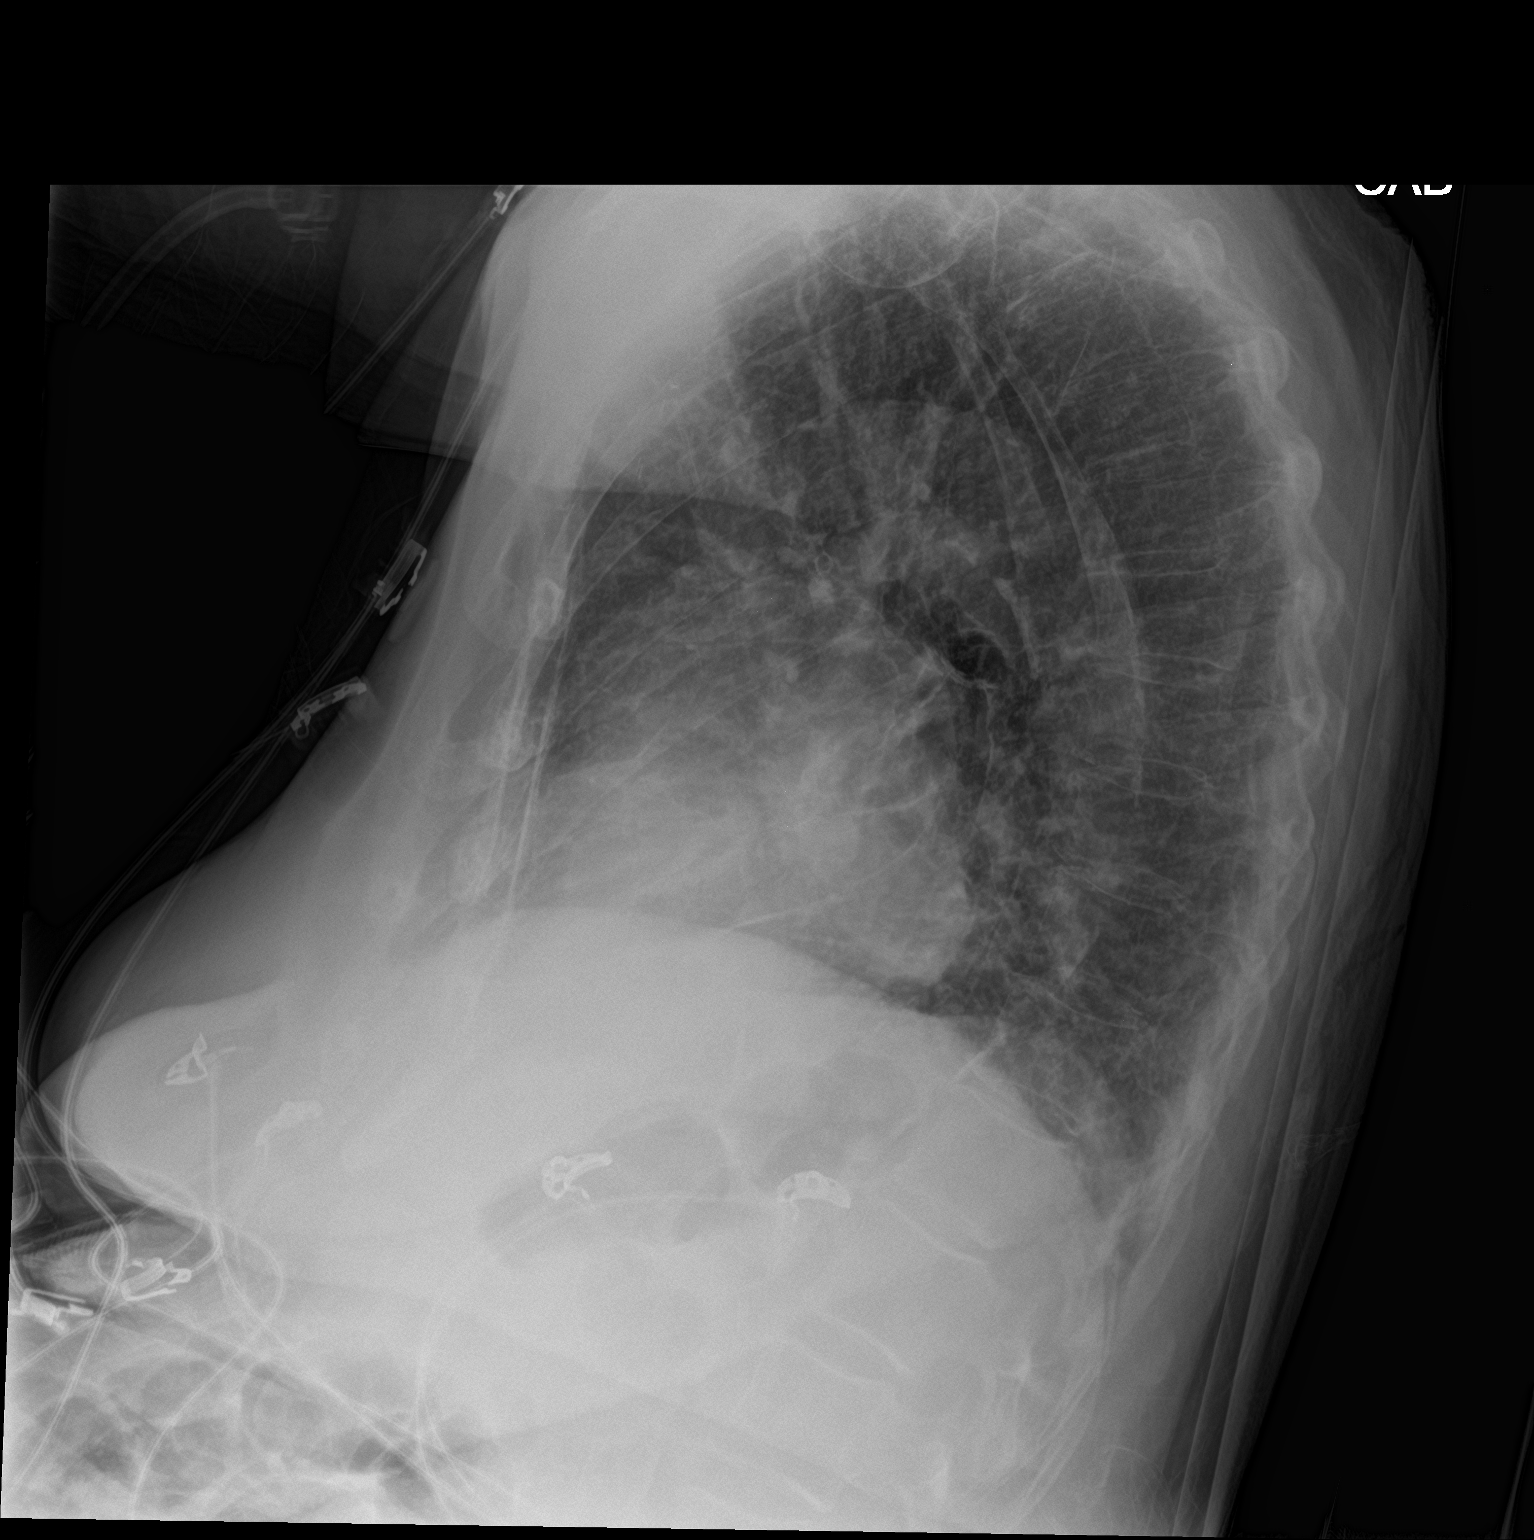

[chest ap]
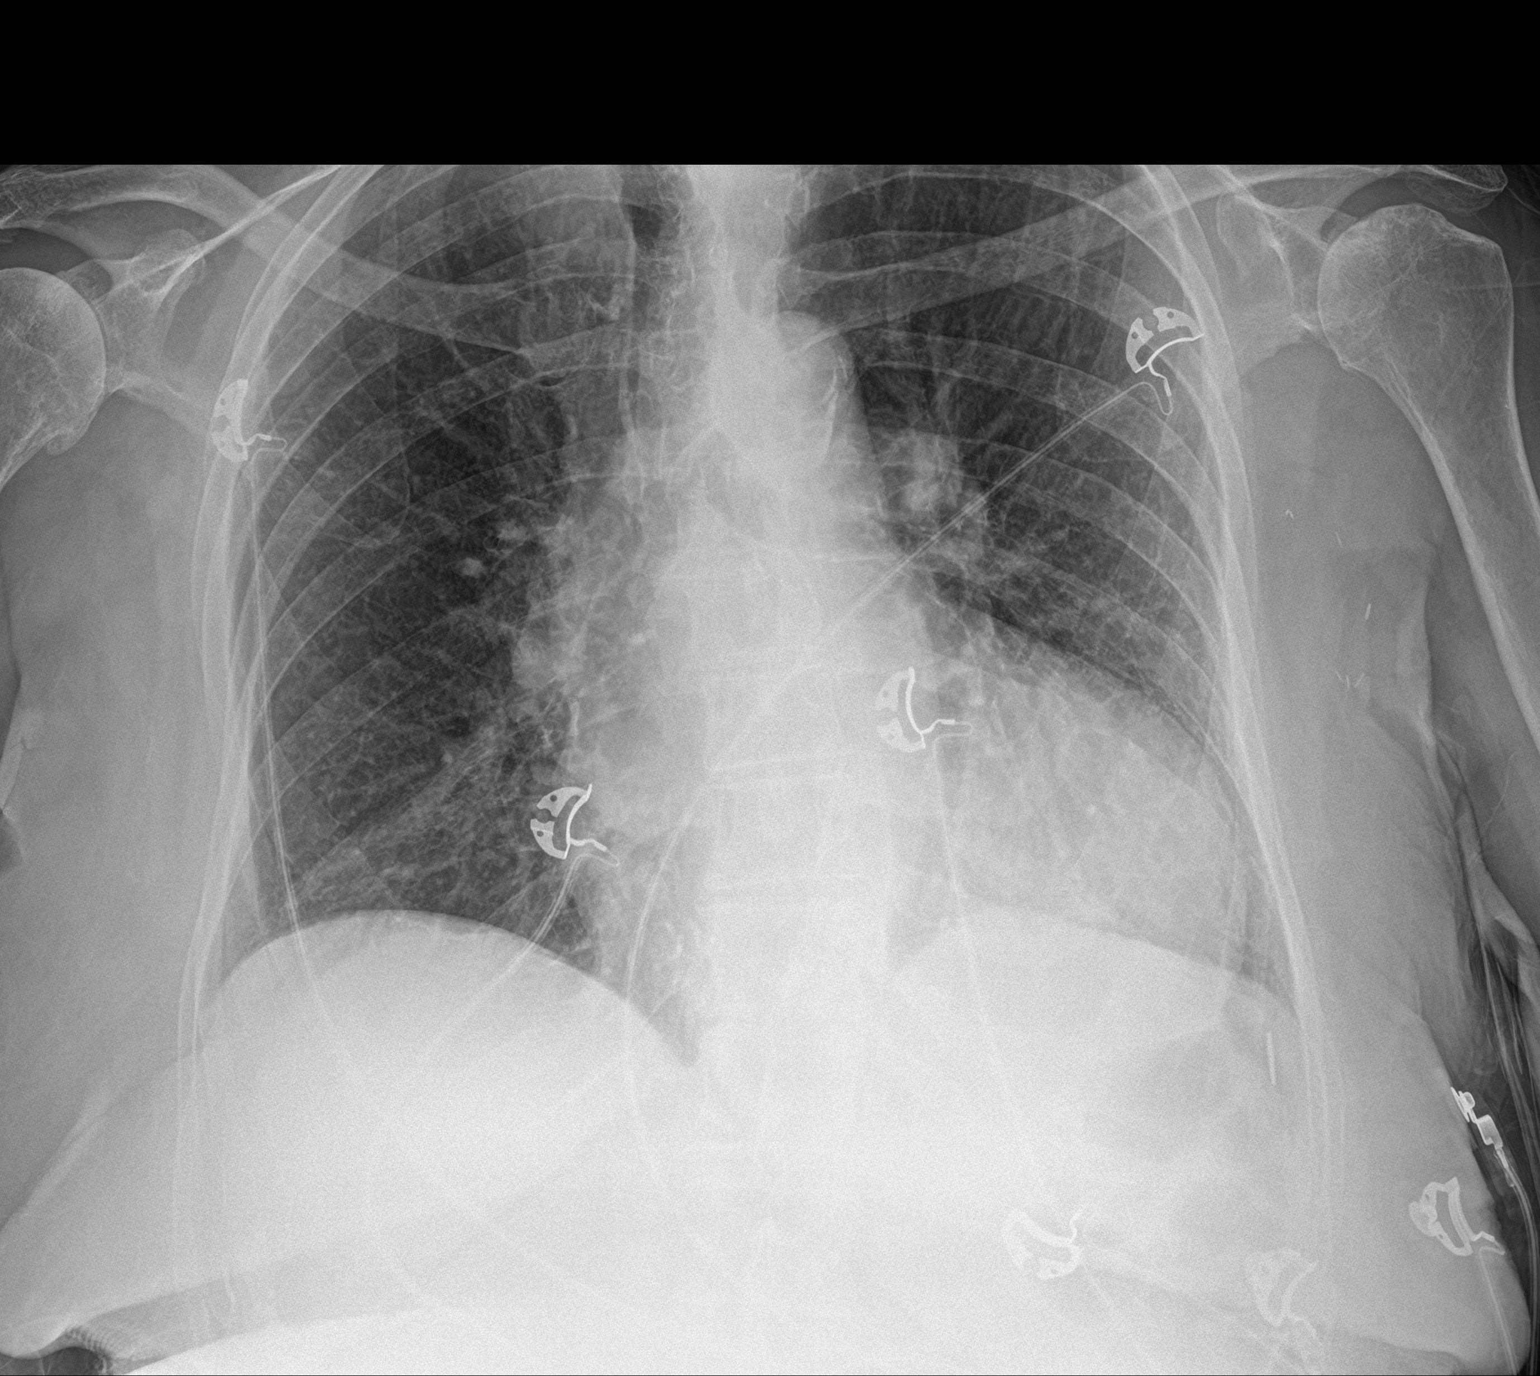

[2 of 2 positions shown; findings below may reference images not displayed]

FINDINGS: Cardiac shadow is again enlarged. Aortic calcifications are seen.
The lungs are well aerated bilaterally. No focal infiltrate or
sizable effusion is seen. Degenerative changes of the thoracic spine
are noted.
IMPRESSION: No acute abnormality noted.

## 2018-06-16 IMAGING — CT CT CERVICAL SPINE W/O CM
4 of 7 series · 13 of 33 positions shown, 14 images · non-contrast
Comparison: Head CT 11/14/2012.

CLINICAL DATA: 89-year-old female with history of trauma after
falling off of a toilet. Dizziness and head pain.

EXAM:
CT HEAD WITHOUT CONTRAST
CT CERVICAL SPINE WITHOUT CONTRAST
TECHNIQUE: Multidetector CT imaging of the head and cervical spine was
performed following the standard protocol without intravenous
contrast. Multiplanar CT image reconstructions of the cervical spine
were also generated.

[Series 6: sagittal bone · sagittal · 0.28mm/px · 5 of 64 slices shown]
[im 11/64  bone]
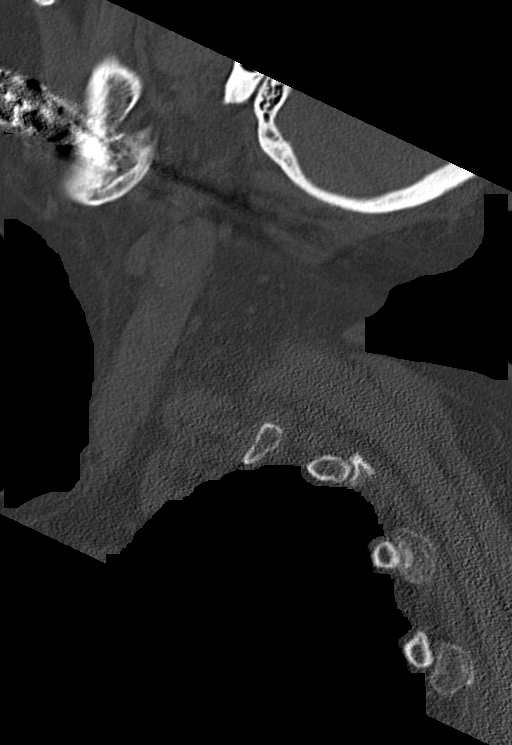
[im 22/64  bone]
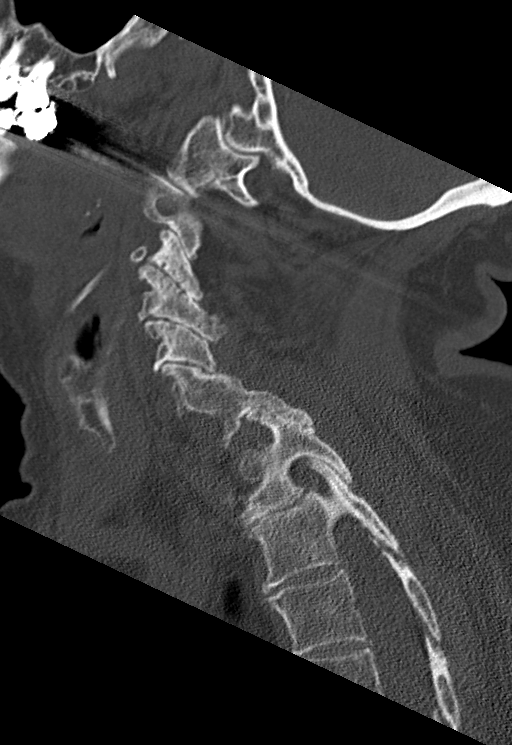
[im 32/64  bone]
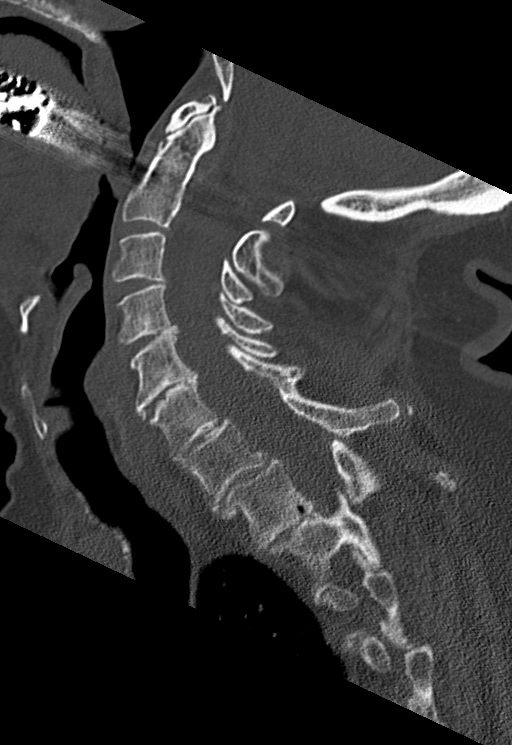
[im 43/64  bone]
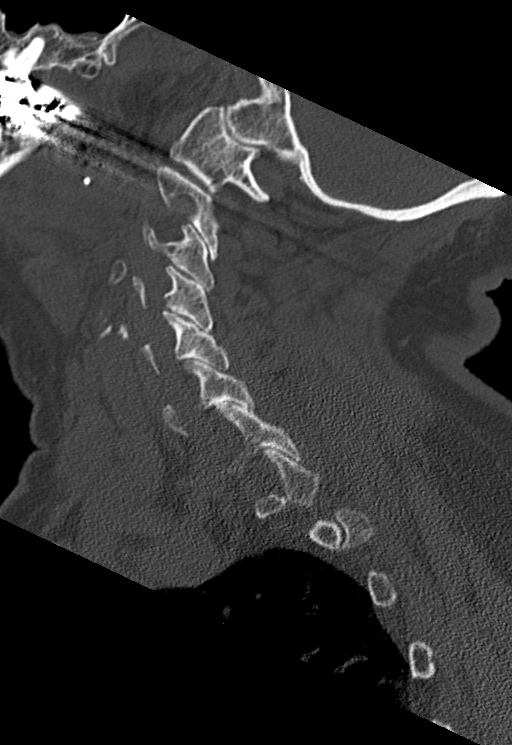
[im 53/64  bone]
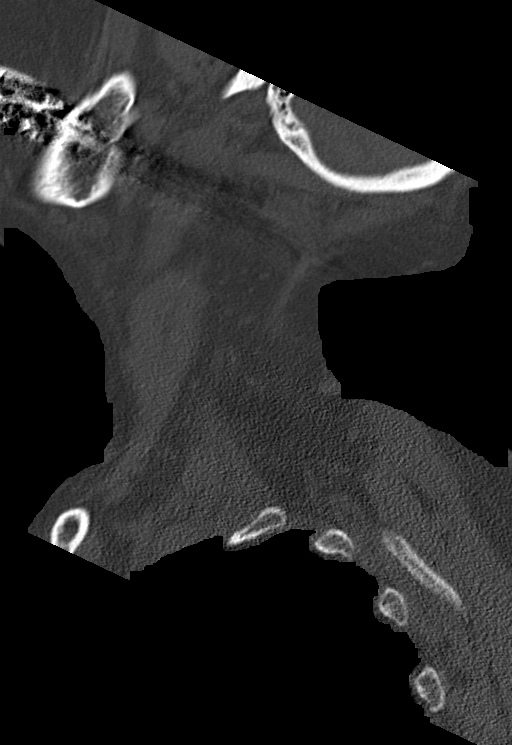

[Series 8: c spine soft · axial · 0.34mm/px · z∈[-268,-196]mm · 3 of 72 slices shown]
[im 18/72  soft-tissue]
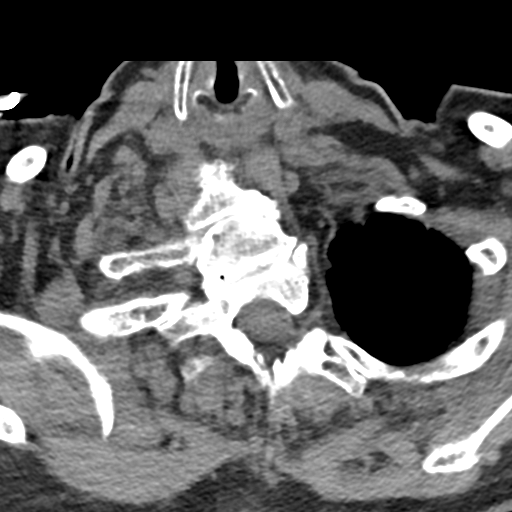
[im 36/72  soft-tissue]
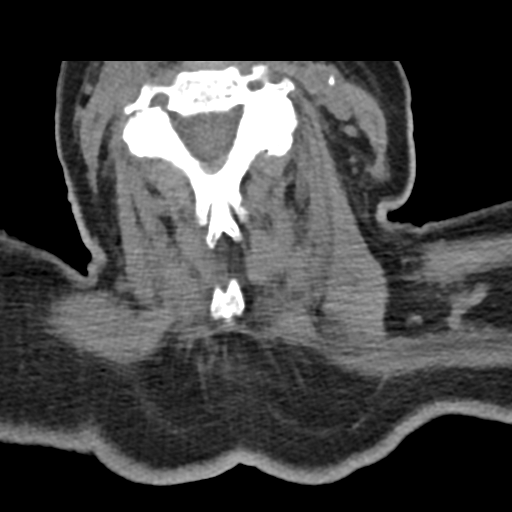
[im 54/72  soft-tissue]
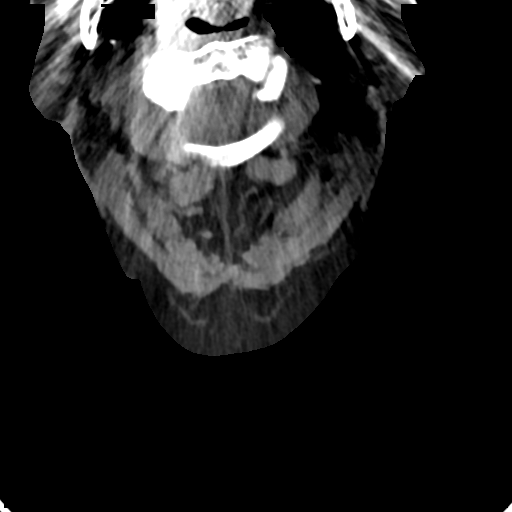

[Series 11: coronal bone · coronal · 0.23mm/px · 1 of 71 slices shown]
[im 36/71  bone]
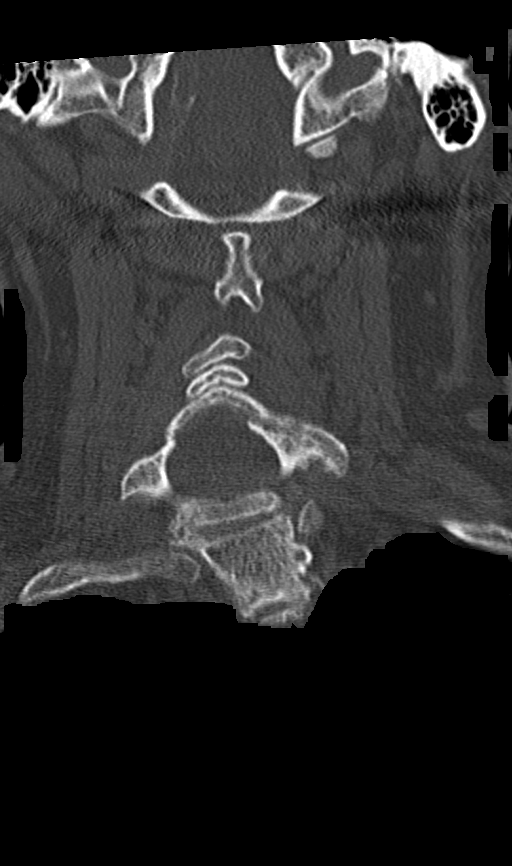

[Series 12: orthogonal bone · axial · 0.36mm/px · z∈[-311,-215]mm · 4 of 85 slices shown, 5 images]
[im 17/85  soft-tissue]
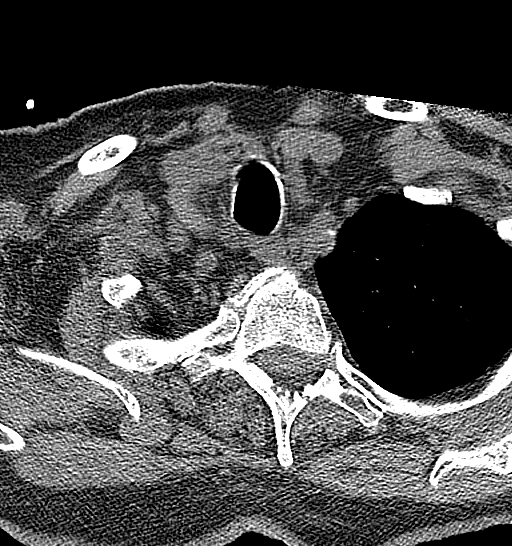
[im 17/85  bone]
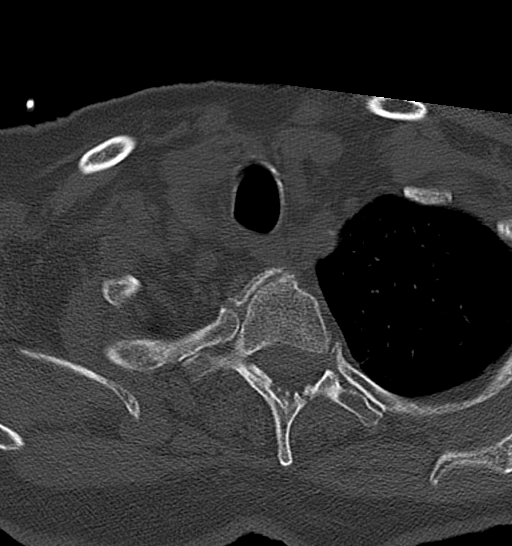
[im 34/85  bone]
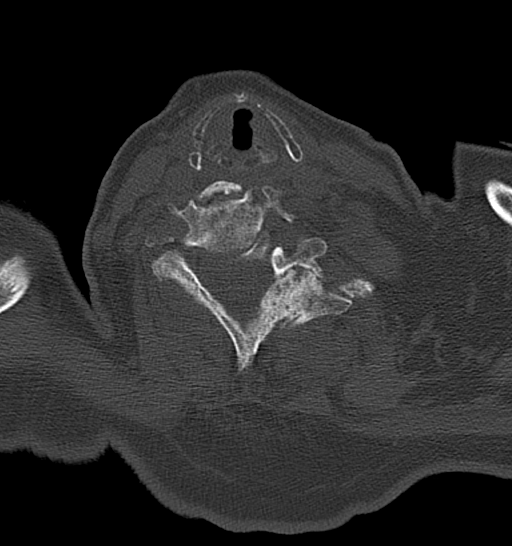
[im 51/85  bone]
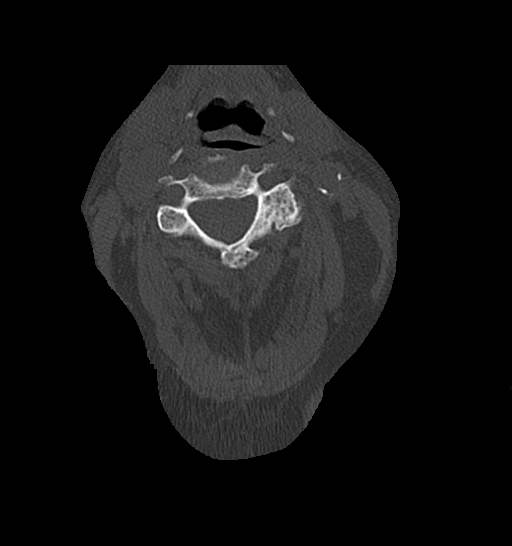
[im 68/85  bone]
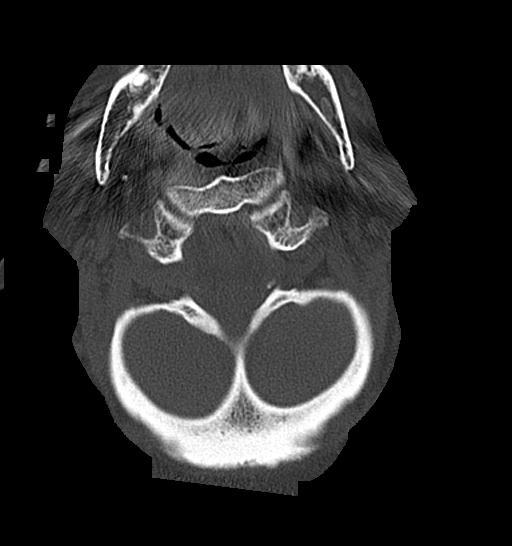

[13 of 33 positions shown; findings below may reference images not displayed]

FINDINGS: CT HEAD FINDINGS

Brain: In the occipital region there is a relatively well-defined
1.9 x 1.2 x 1.5 cm low-attenuation lesion (axial image 16 of series
2 and coronal image 51 of series 4). Slightly caudal to this also in
the occipital region there is a smaller well-defined low-attenuation
lesion measuring 1.0 x 1.3 x 0.9 cm (axial image 14 of series 2 and
coronal image 46 of series 4). In the adjacent brain parenchyma
throughout the right parietal, occipital and posterior temple
regions there is extensive low attenuation, loss of gray-white
junction, and effacement of the overlying sulci, compatible with
underlying edema. No significant midline shift or evidence of uncal
herniation noted at this time. Mild cerebral edema. Patchy and
confluent areas of decreased attenuation are noted throughout the
deep and periventricular white matter of the cerebral hemispheres
bilaterally, compatible with chronic microvascular ischemic disease.
No hydrocephalus.

Vascular: No hyperdense vessel or unexpected calcification.

Skull: Normal. Negative for fracture or focal lesion.

Sinuses/Orbits: No acute finding.

Other: None.

CT CERVICAL SPINE FINDINGS

Alignment: Exaggeration of normal cervical lordosis. Otherwise,
normal.

Skull base and vertebrae: No acute fracture. No primary bone lesion
or focal pathologic process.

Soft tissues and spinal canal: No prevertebral fluid or swelling. No
visible canal hematoma.

Disc levels: Multilevel degenerative disc disease, most severe at
C5-C6. Moderate multilevel facet arthropathy.

Upper chest: Visualized portions are unremarkable.

Other: None.
IMPRESSION: 1. There are 2 low-attenuation lesions in the right occipital lobe
with extensive surrounding cerebral edema in adjacent portions of
the right occipital, parietal and posterior temporal regions.
Findings are highly concerning for metastatic disease to the brain,
but could alternatively reflect the presence of small abscesses.
Further evaluation with brain MRI with and without IV gadolinium is
strongly recommended at this time.
2. No evidence of significant acute traumatic injury to the skull,
brain or cervical spine.
3. Mild cerebral atrophy with extensive chronic microvascular
ischemic changes in the cerebral white matter.
4. Mild multilevel degenerative disc disease and cervical
spondylosis, as above.

These results were called by telephone at the time of interpretation
on 05/14/2017 at [DATE] to Dr. [HOSPITAL] CHAMACK, who verbally
acknowledged these results.
